# Patient Record
Sex: Male | Born: 1960
Health system: Southern US, Community
[De-identification: ages and names within clinical notes are randomized; demographics above are authoritative.]

## PROBLEM LIST (undated history)

## (undated) DIAGNOSIS — Z8619 Personal history of other infectious and parasitic diseases: Secondary | ICD-10-CM

## (undated) DIAGNOSIS — G4730 Sleep apnea, unspecified: Secondary | ICD-10-CM

## (undated) DIAGNOSIS — I2699 Other pulmonary embolism without acute cor pulmonale: Secondary | ICD-10-CM

## (undated) DIAGNOSIS — K219 Gastro-esophageal reflux disease without esophagitis: Secondary | ICD-10-CM

## (undated) DIAGNOSIS — I82402 Acute embolism and thrombosis of unspecified deep veins of left lower extremity: Secondary | ICD-10-CM

## (undated) DIAGNOSIS — I10 Essential (primary) hypertension: Secondary | ICD-10-CM

## (undated) DIAGNOSIS — E079 Disorder of thyroid, unspecified: Secondary | ICD-10-CM

## (undated) DIAGNOSIS — B059 Measles without complication: Secondary | ICD-10-CM

## (undated) HISTORY — DX: Acute embolism and thrombosis of unspecified deep veins of left lower extremity: I82.402

## (undated) HISTORY — DX: Other pulmonary embolism without acute cor pulmonale: I26.99

## (undated) HISTORY — DX: Personal history of other infectious and parasitic diseases: Z86.19

## (undated) HISTORY — PX: FOOT SURGERY: SHX648

## (undated) HISTORY — DX: Gastro-esophageal reflux disease without esophagitis: K21.9

## (undated) HISTORY — DX: Measles without complication: B05.9

---

## 2013-03-21 DIAGNOSIS — I10 Essential (primary) hypertension: Secondary | ICD-10-CM | POA: Diagnosis present

## 2013-03-21 DIAGNOSIS — G4730 Sleep apnea, unspecified: Secondary | ICD-10-CM | POA: Diagnosis present

## 2015-02-09 DIAGNOSIS — E039 Hypothyroidism, unspecified: Secondary | ICD-10-CM | POA: Diagnosis present

## 2015-02-09 DIAGNOSIS — R600 Localized edema: Secondary | ICD-10-CM | POA: Diagnosis present

## 2016-02-04 ENCOUNTER — Inpatient Hospital Stay (HOSPITAL_COMMUNITY)
Admission: EM | Admit: 2016-02-04 | Discharge: 2016-02-06 | DRG: 176 | Disposition: A | Discharge status: Home / Self Care | Payer: BLUE CROSS/BLUE SHIELD | Attending: Internal Medicine | Admitting: Internal Medicine

## 2016-02-04 ENCOUNTER — Inpatient Hospital Stay (HOSPITAL_COMMUNITY): Payer: BLUE CROSS/BLUE SHIELD

## 2016-02-04 ENCOUNTER — Encounter (HOSPITAL_COMMUNITY): Payer: Self-pay | Admitting: *Deleted

## 2016-02-04 ENCOUNTER — Emergency Department (HOSPITAL_COMMUNITY): Payer: BLUE CROSS/BLUE SHIELD

## 2016-02-04 DIAGNOSIS — I82412 Acute embolism and thrombosis of left femoral vein: Secondary | ICD-10-CM | POA: Diagnosis not present

## 2016-02-04 DIAGNOSIS — I77810 Thoracic aortic ectasia: Secondary | ICD-10-CM | POA: Diagnosis not present

## 2016-02-04 DIAGNOSIS — I82432 Acute embolism and thrombosis of left popliteal vein: Secondary | ICD-10-CM | POA: Diagnosis not present

## 2016-02-04 DIAGNOSIS — M79605 Pain in left leg: Secondary | ICD-10-CM | POA: Diagnosis not present

## 2016-02-04 DIAGNOSIS — I10 Essential (primary) hypertension: Secondary | ICD-10-CM | POA: Diagnosis present

## 2016-02-04 DIAGNOSIS — Z9119 Patient's noncompliance with other medical treatment and regimen: Secondary | ICD-10-CM | POA: Diagnosis not present

## 2016-02-04 DIAGNOSIS — R600 Localized edema: Secondary | ICD-10-CM | POA: Diagnosis present

## 2016-02-04 DIAGNOSIS — R918 Other nonspecific abnormal finding of lung field: Secondary | ICD-10-CM | POA: Diagnosis not present

## 2016-02-04 DIAGNOSIS — E039 Hypothyroidism, unspecified: Secondary | ICD-10-CM | POA: Diagnosis present

## 2016-02-04 DIAGNOSIS — Z6841 Body Mass Index (BMI) 40.0 and over, adult: Secondary | ICD-10-CM | POA: Diagnosis not present

## 2016-02-04 DIAGNOSIS — Z8249 Family history of ischemic heart disease and other diseases of the circulatory system: Secondary | ICD-10-CM

## 2016-02-04 DIAGNOSIS — G4733 Obstructive sleep apnea (adult) (pediatric): Secondary | ICD-10-CM | POA: Diagnosis not present

## 2016-02-04 DIAGNOSIS — G4730 Sleep apnea, unspecified: Secondary | ICD-10-CM

## 2016-02-04 DIAGNOSIS — R0602 Shortness of breath: Secondary | ICD-10-CM | POA: Diagnosis not present

## 2016-02-04 DIAGNOSIS — R911 Solitary pulmonary nodule: Secondary | ICD-10-CM | POA: Diagnosis present

## 2016-02-04 DIAGNOSIS — I2699 Other pulmonary embolism without acute cor pulmonale: Secondary | ICD-10-CM | POA: Diagnosis not present

## 2016-02-04 DIAGNOSIS — E669 Obesity, unspecified: Secondary | ICD-10-CM | POA: Diagnosis present

## 2016-02-04 DIAGNOSIS — I82402 Acute embolism and thrombosis of unspecified deep veins of left lower extremity: Secondary | ICD-10-CM | POA: Diagnosis present

## 2016-02-04 DIAGNOSIS — R05 Cough: Secondary | ICD-10-CM | POA: Diagnosis not present

## 2016-02-04 DIAGNOSIS — I2609 Other pulmonary embolism with acute cor pulmonale: Secondary | ICD-10-CM | POA: Diagnosis not present

## 2016-02-04 HISTORY — DX: Essential (primary) hypertension: I10

## 2016-02-04 HISTORY — DX: Sleep apnea, unspecified: G47.30

## 2016-02-04 HISTORY — DX: Disorder of thyroid, unspecified: E07.9

## 2016-02-04 LAB — COMPREHENSIVE METABOLIC PANEL
ALK PHOS: 83 U/L (ref 38–126)
ALT: 24 U/L (ref 17–63)
ANION GAP: 8 (ref 5–15)
AST: 18 U/L (ref 15–41)
Albumin: 3.9 g/dL (ref 3.5–5.0)
BILIRUBIN TOTAL: 0.9 mg/dL (ref 0.3–1.2)
BUN: 19 mg/dL (ref 6–20)
CALCIUM: 9 mg/dL (ref 8.9–10.3)
CO2: 23 mmol/L (ref 22–32)
Chloride: 106 mmol/L (ref 101–111)
Creatinine, Ser: 1.22 mg/dL (ref 0.61–1.24)
GLUCOSE: 108 mg/dL — AB (ref 65–99)
Potassium: 3.9 mmol/L (ref 3.5–5.1)
Sodium: 137 mmol/L (ref 135–145)
TOTAL PROTEIN: 7.1 g/dL (ref 6.5–8.1)

## 2016-02-04 LAB — CBC
HCT: 40.4 % (ref 39.0–52.0)
HEMOGLOBIN: 14.2 g/dL (ref 13.0–17.0)
MCH: 31.2 pg (ref 26.0–34.0)
MCHC: 35.1 g/dL (ref 30.0–36.0)
MCV: 88.8 fL (ref 78.0–100.0)
Platelets: 180 10*3/uL (ref 150–400)
RBC: 4.55 MIL/uL (ref 4.22–5.81)
RDW: 12.2 % (ref 11.5–15.5)
WBC: 10.3 10*3/uL (ref 4.0–10.5)

## 2016-02-04 LAB — HEPARIN LEVEL (UNFRACTIONATED): HEPARIN UNFRACTIONATED: 0.27 [IU]/mL — AB (ref 0.30–0.70)

## 2016-02-04 LAB — BRAIN NATRIURETIC PEPTIDE: B NATRIURETIC PEPTIDE 5: 36.4 pg/mL (ref 0.0–100.0)

## 2016-02-04 LAB — TROPONIN I

## 2016-02-04 LAB — APTT: aPTT: 28 seconds (ref 24–37)

## 2016-02-04 LAB — D-DIMER, QUANTITATIVE: D-Dimer, Quant: 16.51 ug/mL-FEU — ABNORMAL HIGH (ref 0.00–0.50)

## 2016-02-04 LAB — PROTIME-INR
INR: 1.11 (ref 0.00–1.49)
Prothrombin Time: 14.1 seconds (ref 11.6–15.2)

## 2016-02-04 MED ORDER — ONDANSETRON HCL 4 MG/2ML IJ SOLN: 4.0000 mg | Freq: Four times a day (QID) | INTRAMUSCULAR | Status: DC | PRN

## 2016-02-04 MED ORDER — FUROSEMIDE 40 MG PO TABS
40.0000 mg | ORAL_TABLET | Freq: Every morning | ORAL | Status: DC
  Administered 2016-02-05 – 2016-02-06 (×2): 40 mg via ORAL
  Filled 2016-02-04 (×2): qty 1

## 2016-02-04 MED ORDER — HEPARIN (PORCINE) IN NACL 100-0.45 UNIT/ML-% IJ SOLN
1950.0000 [IU]/h | INTRAMUSCULAR | Status: DC
  Administered 2016-02-05 – 2016-02-06 (×2): 1950 [IU]/h via INTRAVENOUS
  Filled 2016-02-04 (×4): qty 250

## 2016-02-04 MED ORDER — CARVEDILOL 12.5 MG PO TABS
12.5000 mg | ORAL_TABLET | Freq: Every day | ORAL | Status: DC
  Administered 2016-02-04 – 2016-02-05 (×2): 12.5 mg via ORAL
  Filled 2016-02-04 (×2): qty 1

## 2016-02-04 MED ORDER — ACETAMINOPHEN 325 MG PO TABS: 650.0000 mg | ORAL_TABLET | Freq: Four times a day (QID) | ORAL | Status: DC | PRN

## 2016-02-04 MED ORDER — IOPAMIDOL (ISOVUE-370) INJECTION 76%
100.0000 mL | Freq: Once | INTRAVENOUS | Status: AC | PRN
  Administered 2016-02-04: 100 mL via INTRAVENOUS

## 2016-02-04 MED ORDER — ACETAMINOPHEN 650 MG RE SUPP: 650.0000 mg | Freq: Four times a day (QID) | RECTAL | Status: DC | PRN

## 2016-02-04 MED ORDER — HEPARIN (PORCINE) IN NACL 100-0.45 UNIT/ML-% IJ SOLN
1750.0000 [IU]/h | INTRAMUSCULAR | Status: DC
  Administered 2016-02-04: 1750 [IU]/h via INTRAVENOUS
  Filled 2016-02-04 (×3): qty 250

## 2016-02-04 MED ORDER — ONDANSETRON HCL 4 MG PO TABS: 4.0000 mg | ORAL_TABLET | Freq: Four times a day (QID) | ORAL | Status: DC | PRN

## 2016-02-04 MED ORDER — HEPARIN BOLUS VIA INFUSION
5000.0000 [IU] | Freq: Once | INTRAVENOUS | Status: DC
  Administered 2016-02-04: 5000 [IU] via INTRAVENOUS
  Filled 2016-02-04: qty 5000

## 2016-02-04 NOTE — Progress Notes (Signed)
Pt stated that he will call when ready for CPAP.  RT to monitor and assess as needed. 

## 2016-02-04 NOTE — ED Notes (Signed)
MD at bedside. 

## 2016-02-04 NOTE — ED Notes (Signed)
Jacubowitz at bedside. 

## 2016-02-04 NOTE — Progress Notes (Signed)
MD notified about results of LE doppler. + blood clot. Patient currently on Heparin drip at 17.5 [facility]. Clelia CroftShaw, RN

## 2016-02-04 NOTE — Progress Notes (Addendum)
ANTICOAGULATION CONSULT NOTE   Pharmacy Consult for IV heparin Indication: pulmonary embolus/DVT  No Known Allergies  Patient Measurements: Height: 6\' 1"  (185.4 cm) Weight: (!) 350 lb 8 oz (158.986 kg) IBW/kg (Calculated) : 79.9 Heparin Dosing Weight: 103 kg  Vital Signs: Temp: 98.5 F (36.9 C) (04/22 2119) Temp Source: Oral (04/22 2119) BP: 127/75 mmHg (04/22 2119) Pulse Rate: 70 (04/22 2119)  Labs:  Recent Labs  02/04/16 0900 02/04/16 1421 02/04/16 2111  HGB 14.2  --   --   HCT 40.4  --   --   PLT 180  --   --   APTT  --  28  --   LABPROT  --  14.1  --   INR  --  1.11  --   HEPARINUNFRC  --   --  0.27*  CREATININE 1.22  --   --   TROPONINI <0.03  --   --     Estimated Creatinine Clearance: 109.2 mL/min (by C-G formula based on Cr of 1.22).   Medical History: Past Medical History  Diagnosis Date  . Hypertension   . Thyroid disease   . Sleep apnea     Medications:  Prescriptions prior to admission  Medication Sig Dispense Refill Last Dose  . carvedilol (COREG) 12.5 MG tablet Take 12.5 mg by mouth at bedtime.    02/03/2016 at 9pm  . dextromethorphan-guaiFENesin (MUCINEX DM) 30-600 MG 12hr tablet Take 1 tablet by mouth 2 (two) times daily as needed for cough.   Past Week at Unknown time  . furosemide (LASIX) 40 MG tablet Take 40 mg by mouth every morning.    Past Month at Unknown time   Scheduled:  . carvedilol  12.5 mg Oral QHS  . [START ON 02/05/2016] furosemide  40 mg Oral q morning - 10a   Infusions:  . heparin      Assessment: 54 yoM c/o SOB with exertion and rest x 2 days and left leg swelling x 1 week.  IV heparin per Rx for +PE.  Dopplers (4/22) showed + DVT in left femoral vein Heparin level = 0.27 with heparin infusing @ 1750 units/hr  Goal of Therapy:  Heparin level 0.3-0.7 units/ml Monitor platelets by anticoagulation protocol: Yes   Plan:   Increase IV Heparin infusion to 1950 units/hr  Check heparin level 6 hr after rate  increase  Daily CBC/HL  Vallery Mcdade, Joselyn GlassmanLeann Trefz, PharmD 02/04/2016,10:43 PM  ADDENDUM:  Heparin level = 0.37 with heparin infusing @ 1950 units/hr No complications of therapy noted  Heparin level therapeutic (Goal = 0.3-0.7)  Plan:  Continue heparin @ 1950 units/hr.  Repeat heparin level in 6 hr to confirm therapeutic dose.  Terrilee FilesLeann Karmina Zufall, PharmD 02/05/16 @ 06:43

## 2016-02-04 NOTE — Progress Notes (Signed)
*  PRELIMINARY RESULTS* Vascular Ultrasound Lower extremity venous duplex has been completed.  Preliminary findings: DVT noted in the Left femoral vein, popliteal vein, and gastroc veins. No DVT RLE.  Gave results to CornlandBrooke, RN   Farrel DemarkJill Eunice, RDMS, RVT  02/04/2016, 3:40 PM

## 2016-02-04 NOTE — H&P (Signed)
[facility] History and Physical  PAZ WINSETT ZOX:[id number] DOB: [birth year] DOA: 02/04/2016  Referring physician: Dr. Doug Sou, EDP PCP: No primary care provider on file.  Specialists: None Patient coming from: Home  Chief Complaint: Shortness of breath  HPI: [name] is a 55 y.o. male with a medical history of hypertension, sleep apnea, who presented to the emergency department with complaints of shortness of breath. Patient states that shortness of breath has been ongoing for pressing one year however is worsened over the last 2 weeks. Patient also endorses flulike illness 2 weeks prior. Patient states he's been seeing a practitioner at urgent care for the last several months. He was on lisinopril however this was discontinued due to kidney issues. He was then placed on Lasix and Coreg. Patient also complained of left leg pain and swelling. He states this has been ongoing since March. Patient denies any recent travel. Currently he denies any chest pain, abdominal pain, dizziness, headache, nausea, vomiting, diarrhea or constipation, problems urinating.  ED Course: CTA chest showed submassive PE. EDP did speak with pulmonology, who recommended IV heparin for a few days prior to oral anticoagulation.  Review of Systems:  As per HPI otherwise 10 point review of systems negative.   Past Medical History  Diagnosis Date  . Hypertension   . Thyroid disease   . Sleep apnea     History reviewed. No pertinent past surgical history.  Social History:  reports that he has never smoked. He does not have any smokeless tobacco history on file. He reports that he does not drink alcohol or use illicit drugs.  No Known Allergies  Family History  Problem Relation Age of Onset  . Hypertension Father   . Heart disease Mother   . Pulmonary embolism Mother     Prior to Admission medications   Medication Sig Start Date End Date Taking? Authorizing Provider  carvedilol  (COREG) 12.5 MG tablet Take 12.5 mg by mouth at bedtime.  02/17/15 02/17/16 Yes Historical Provider, MD  dextromethorphan-guaiFENesin (MUCINEX DM) 30-600 MG 12hr tablet Take 1 tablet by mouth 2 (two) times daily as needed for cough.   Yes Historical Provider, MD  furosemide (LASIX) 40 MG tablet Take 40 mg by mouth every morning.  03/28/15 03/27/16 Yes Historical Provider, MD    Physical Exam: Filed Vitals:   02/04/16 1210 02/04/16 1410  BP: 138/65 157/89  Pulse: 64 72  Temp:    Resp: 20 18     General: Well developed, well nourished, NAD, appears stated age  HEENT: NCAT, PERRLA, EOMI, Anicteic Sclera, mucous membranes moist.   Neck: Supple, no JVD, no masses   Cardiovascular: S1 S2 auscultated, no rubs, murmurs or gallops. Regular rate and rhythm.  Respiratory: Clear to auscultation bilaterally   Abdomen: Soft, obese, nontender, nondistended, + bowel sounds  Extremities: warm dry without cyanosis clubbing. LLE edema with erythema > RLE   Neuro: AAOx3, cranial nerves grossly intact. Strength 5/5 in patient's upper and lower extremities bilaterally  Skin: Without rashes exudates or nodules  Psych: Normal affect and demeanor with intact judgement and insight  Labs on Admission: I have personally reviewed following labs and imaging studies CBC:  Recent Labs Lab 02/04/16 0900  WBC 10.3  HGB 14.2  HCT 40.4  MCV 88.8  PLT 180   Basic Metabolic Panel:  Recent Labs Lab 02/04/16 0900  NA 137  K 3.9  CL 106  CO2 23  GLUCOSE 108*  BUN 19  CREATININE 1.22  CALCIUM 9.0   GFR: Estimated Creatinine Clearance: 109.2 mL/min (by C-G formula based on Cr of 1.22). Liver Function Tests:  Recent Labs Lab 02/04/16 0900  AST 18  ALT 24  ALKPHOS 83  BILITOT 0.9  PROT 7.1  ALBUMIN 3.9   No results for input(s): LIPASE, AMYLASE in the last 168 hours. No results for input(s): AMMONIA in the last 168 hours. Coagulation Profile: No results for input(s): INR, PROTIME in the  last 168 hours. Cardiac Enzymes:  Recent Labs Lab 02/04/16 0900  TROPONINI <0.03   BNP (last 3 results) No results for input(s): PROBNP in the last 8760 hours. HbA1C: No results for input(s): HGBA1C in the last 72 hours. CBG: No results for input(s): GLUCAP in the last 168 hours. Lipid Profile: No results for input(s): CHOL, HDL, LDLCALC, TRIG, CHOLHDL, LDLDIRECT in the last 72 hours. Thyroid Function Tests: No results for input(s): TSH, T4TOTAL, FREET4, T3FREE, THYROIDAB in the last 72 hours. Anemia Panel: No results for input(s): VITAMINB12, FOLATE, FERRITIN, TIBC, IRON, RETICCTPCT in the last 72 hours. Urine analysis: No results found for: COLORURINE, APPEARANCEUR, LABSPEC, PHURINE, GLUCOSEU, HGBUR, BILIRUBINUR, KETONESUR, PROTEINUR, UROBILINOGEN, NITRITE, LEUKOCYTESUR Sepsis Labs: (procalcitonin:4,lacticidven:4) )No results found for this or any previous visit (from the past 240 hour(s)).   Radiological Exams on Admission: Dg Chest 2 View  02/04/2016  CLINICAL DATA:  SOB x 2 weeks, worsening x 2-3 days ago, dry cough x 1 week HTN, never smoker No sx EXAM: CHEST - 2 VIEW COMPARISON:  None available FINDINGS: Relatively low lung volumes with some crowding of perihilar bronchovascular structures. No focal infiltrate or overt edema. Motion degrades the lateral radiograph. Heart size upper limits normal. No effusion.  No pneumothorax. Visualized skeletal structures are unremarkable. IMPRESSION: Low volumes.  No acute disease. Electronically Signed   By: Corlis Leak M.D.   On: 02/04/2016 10:14   Ct Angio Chest Pe W/cm &/or Wo Cm  02/04/2016  CLINICAL DATA:  Evaluate for pulmonary embolus ; Pt c/o SOB with exertion and at rest x 2 days, also c/o left leg swelling x 1 week , today left calf pain Hx of HTN EXAM: CT ANGIOGRAPHY CHEST WITH CONTRAST TECHNIQUE: Multidetector CT imaging of the chest was performed using the standard protocol during bolus administration of intravenous  contrast. Multiplanar CT image reconstructions and MIPs were obtained to evaluate the vascular anatomy. CONTRAST:  100 cc Isovue 370 COMPARISON:  Chest x-ray 02/04/2016 FINDINGS: Heart: Overall heart size is upper normal. The RV/LV ratio is 1.1. No pericardial effusion. Vascular structures: Pulmonary arteries are only moderately well opacified, limiting evaluation of small pulmonary arteries. However there is significant thrombus in the right upper lobe and right lower lobe pulmonary artery branches. Although no definite emboli are identified in the left pulmonary artery system, smaller emboli are difficult to exclude. Mediastinum/thyroid: The visualized portion of the thyroid gland has a normal appearance. Moderate hiatal hernia is present. Lungs/Airways: In the right middle lobe there is a 4 x 4 mm nodule (for mm mean diameter) on image 49 of series 11. No focal consolidation or pleural effusion identified. No pulmonary edema. Upper abdomen: Hiatal hernia; otherwise unremarkable. Chest wall/osseous structures: Within the midline of the upper anterior chest there is a rounded soft tissue density mass measuring 3.4 x 2.2 cm anterior to the sternomanubrial junction. Visualized osseous structures have a normal appearance. Review of the MIP images confirms the above findings. IMPRESSION: 1. Positive for acute PE with CT evidence of right heart strain (RV/LV  Ratio = 1.1) consistent with at least submassive (intermediate risk) PE. The presence of right heart strain has been associated with an increased risk of morbidity and mortality. Please activate Code PE by paging [phone]. 2. Right middle lobe nodule. No follow-up needed if patient is low-risk. Non-contrast chest CT can be considered in 12 months if patient is high-risk. This recommendation follows the consensus statement: Guidelines for Management of Incidental Pulmonary Nodules Detected on CT Images:From the Fleischner Society 2017; published online before  print (10.1148/radiol.[phone]). 3. Rounded soft tissue density mass in the upper central anterior chest, anterior to the sternomanubrial junction. Findings could be related to small epidermal inclusion cyst or small solid mass. Correlation with physical exam findings recommended. Critical Value/emergent results were called by telephone at the time of interpretation on 02/04/2016 at 1:47 pm to Dr. Doug SouSAM JACUBOWITZ , who verbally acknowledged these results. Electronically Signed   By: Norva PavlovElizabeth  Brown M.D.   On: 02/04/2016 13:47    EKG: Independently reviewed. Sinus rhythm, rate 81  Assessment/Plan  Dyspnea secondary to acute submassive PE -CTA chest showed positive for acute PE with evidence of right heart strain consistent with early submassive PE -Will place on heparin per pharmacy -Patient can likely be transitioned to NOAC -Echocardiogram ordered -Suspect PE due clotting factor disorder, given that mother had PE as well.  Patient will need to have work up as an outpatient.  Left leg pain -LE doppler ordered  Hypertension -Continue coreg and lasix  Lung nodule -Noted on CT scan: Right middle lobe -Patient should have repeat CT chest within 12 months  Morbid obesity -BMI >46 -Patient should discuss lifestyle modifications and weight management with PCP   Thyroid disease -Will order TSH and FT4 -Supposedly was on synthroid but was not taking it.   Sleep apnea -Continue CPAP  DVT prophylaxis: Heparin  Code Status: Full  Family Communication: Family at bedside. Admission, patients condition and plan of care including tests being ordered have been discussed with the patient and family who indicate understanding and agree with the plan and Code Status.  Disposition Plan: Admitted to telemetry.   Consults called: none   Admission status: Inpatient   Time spent: 70 minutes  Anakaren Campion D.O. [facility] Pager [phone]  If 7PM-7AM, please contact  night-coverage [email or web address] Password [email or web address] 02/04/2016, 2:54 PM

## 2016-02-04 NOTE — Progress Notes (Signed)
I am at [facility]. Call from Dr Doug SouSam Jacubowitz about CTA showing RV strain - and ? If pulmnary needed  Calculated PESI score - He has class 1 - based on age 55 y.o. and male - but otherwise I am told no hx of cancer, cvs or lung disease . Has normal RR, temp, pulse ox on RA, BP and mentation  REc over phone  - [facility] admit  - IV heparin for few days before oral anticoag  Dr. Kalman ShanMurali Teleah Villamar, M.D., [facility] Hospital[facility].C.C.P Pulmonary and Critical Care Medicine Staff Physician [facility] Pager: [phone], If no answer or between  15:00h - 7:00h: call [phone]  02/04/2016 2:16 PM

## 2016-02-04 NOTE — ED Notes (Signed)
Pt c/o SOB with exertion and at rest x 2 days, also c/o left leg swelling x 1 week , today left calf pain

## 2016-02-04 NOTE — ED Provider Notes (Signed)
CSN: [id number]     Arrival date & time 02/04/16  4098 History   First MD Initiated Contact with Patient 02/04/16 [phone]     Chief Complaint  Patient presents with  . Shortness of Breath     (Consider location/radiation/quality/duration/timing/severity/associated sxs/prior Treatment) HPI Complains of shortness of breath for 1 year worse over the past 2 weeks. Dyspnea is not made worse by lying supine. He cannot comment on exertional component as he is not exerting himself at all. He admits to a flulike illness 2 weeks ago with cough which has since resolved. He denies any fever. No treatment prior to coming here aside from his usual medications. He also complains of pain in his left leg starting at and radiating to left knee onset approximately 2 weeks ago which is worse with certain positions and improved with certain positions leg discomfort is minimal at present. Denies chest pain denies fever Past Medical History  Diagnosis Date  . Hypertension   . Thyroid disease   Sleep apnea History reviewed. No pertinent past surgical history. History reviewed. No pertinent family history. Social History  Substance Use Topics  . Smoking status: Never Smoker   . Smokeless tobacco: None  . Alcohol Use: No    Review of Systems  Respiratory: Positive for shortness of breath.   Cardiovascular: Positive for leg swelling.       Bilateral Leg swelling for-any months.   Musculoskeletal: Positive for myalgias.       Left leg pain for 2 weeks  All other systems reviewed and are negative.     Allergies  Review of patient's allergies indicates not on file.  Home Medications   Prior to Admission medications   Medication Sig Start Date End Date Taking? Authorizing Provider  carvedilol (COREG) 12.5 MG tablet Take 12.5 mg by mouth. 02/17/15 02/17/16 Yes Historical Provider, MD  furosemide (LASIX) 40 MG tablet Take 40 mg by mouth. 03/28/15 03/27/16 Yes Historical Provider, MD  levothyroxine (SYNTHROID,  LEVOTHROID) 25 MCG tablet Take by mouth. 01/13/15  Yes Historical Provider, MD  lisinopril (PRINIVIL,ZESTRIL) 10 MG tablet  02/14/15  Yes Historical Provider, MD  naproxen (NAPROSYN) 500 MG tablet Take 500 mg by mouth. 02/09/15 02/09/16 Yes Historical Provider, MD   BP 178/102 mmHg  Pulse 79  Temp(Src) 98.7 F (37.1 C) (Oral)  Resp 17  SpO2 98% Physical Exam  Constitutional: He appears well-developed and well-nourished. No distress.  HENT:  Head: Normocephalic and atraumatic.  Eyes: Conjunctivae are normal. Pupils are equal, round, and reactive to light.  Neck: Neck supple. No JVD present. No tracheal deviation present. No thyromegaly present.  Cardiovascular: Normal rate and regular rhythm.   No murmur heard. Pulmonary/Chest: Effort normal and breath sounds normal.  Abdominal: Soft. Bowel sounds are normal. He exhibits no distension. There is no tenderness.  Morbidly obese  Musculoskeletal: Normal range of motion. He exhibits no edema or tenderness.  Bilateral 2+ pretibial pitting edema. No tenderness extremities bilateral DP pulses 2+  Neurological: He is alert. Coordination normal.  Skin: Skin is warm and dry. No rash noted.  Psychiatric: He has a normal mood and affect.  Nursing note and vitals reviewed.   ED Course  Procedures (including critical care time) Labs Review Labs Reviewed  CBC  COMPREHENSIVE METABOLIC PANEL    Imaging Review No results found. I have personally reviewed and evaluated these images and lab results as part of my medical decision-making.   EKG Interpretation   Date/Time:  Saturday February 04 2016  09:09:08 EDT Ventricular Rate:  81 PR Interval:  191 QRS Duration: 94 QT Interval:  404 QTC Calculation: 469 R Axis:   -2 Text Interpretation:  Sinus rhythm Borderline repolarization abnormality  Baseline wander in lead(s) V5 No old tracing to compare Confirmed by  Hayleigh Bawa  MD, Raydan Schlabach [phone]([id number]) on 02/04/2016 9:12:14 AM     1 PM patient resting  comfortably and in no distress. Results for orders placed or performed during the hospital encounter of 02/04/16  CBC  Result Value Ref Range   WBC 10.3 4.0 - 10.5 K/uL   RBC 4.55 4.22 - 5.81 MIL/uL   Hemoglobin 14.2 13.0 - 17.0 g/dL   HCT 60.440.4 54.039.0 - 98.152.0 %   MCV 88.8 78.0 - 100.0 fL   MCH 31.2 26.0 - 34.0 pg   MCHC 35.1 30.0 - 36.0 g/dL   RDW 19.112.2 47.811.5 - 29.515.5 %   Platelets 180 150 - 400 K/uL  Comprehensive metabolic panel  Result Value Ref Range   Sodium 137 135 - 145 mmol/L   Potassium 3.9 3.5 - 5.1 mmol/L   Chloride 106 101 - 111 mmol/L   CO2 23 22 - 32 mmol/L   Glucose, Bld 108 (H) 65 - 99 mg/dL   BUN 19 6 - 20 mg/dL   Creatinine, Ser 6.211.22 0.61 - 1.24 mg/dL   Calcium 9.0 8.9 - 30.810.3 mg/dL   Total Protein 7.1 6.5 - 8.1 g/dL   Albumin 3.9 3.5 - 5.0 g/dL   AST 18 15 - 41 U/L   ALT 24 17 - 63 U/L   Alkaline Phosphatase 83 38 - 126 U/L   Total Bilirubin 0.9 0.3 - 1.2 mg/dL   GFR calc non Af Amer >60 >60 mL/min   GFR calc Af Amer >60 >60 mL/min   Anion gap 8 5 - 15  D-dimer, quantitative (not at [facility])  Result Value Ref Range   D-Dimer, Quant 16.51 (H) 0.00 - 0.50 ug/mL-FEU  Brain natriuretic peptide  Result Value Ref Range   B Natriuretic Peptide 36.4 0.0 - 100.0 pg/mL  Troponin I  Result Value Ref Range   Troponin I <0.03 <0.031 ng/mL   Dg Chest 2 View  02/04/2016  CLINICAL DATA:  SOB x 2 weeks, worsening x 2-3 days ago, dry cough x 1 week HTN, never smoker No sx EXAM: CHEST - 2 VIEW COMPARISON:  None available FINDINGS: Relatively low lung volumes with some crowding of perihilar bronchovascular structures. No focal infiltrate or overt edema. Motion degrades the lateral radiograph. Heart size upper limits normal. No effusion.  No pneumothorax. Visualized skeletal structures are unremarkable. IMPRESSION: Low volumes.  No acute disease. Electronically Signed   By: Corlis Leak  Hassell M.D.   On: 02/04/2016 10:14   Ct Angio Chest Pe W/cm &/or Wo Cm  02/04/2016  CLINICAL DATA:   Evaluate for pulmonary embolus ; Pt c/o SOB with exertion and at rest x 2 days, also c/o left leg swelling x 1 week , today left calf pain Hx of HTN EXAM: CT ANGIOGRAPHY CHEST WITH CONTRAST TECHNIQUE: Multidetector CT imaging of the chest was performed using the standard protocol during bolus administration of intravenous contrast. Multiplanar CT image reconstructions and MIPs were obtained to evaluate the vascular anatomy. CONTRAST:  100 cc Isovue 370 COMPARISON:  Chest x-ray 02/04/2016 FINDINGS: Heart: Overall heart size is upper normal. The RV/LV ratio is 1.1. No pericardial effusion. Vascular structures: Pulmonary arteries are only moderately well opacified, limiting evaluation of small pulmonary arteries. However there  is significant thrombus in the right upper lobe and right lower lobe pulmonary artery branches. Although no definite emboli are identified in the left pulmonary artery system, smaller emboli are difficult to exclude. Mediastinum/thyroid: The visualized portion of the thyroid gland has a normal appearance. Moderate hiatal hernia is present. Lungs/Airways: In the right middle lobe there is a 4 x 4 mm nodule (for mm mean diameter) on image 49 of series 11. No focal consolidation or pleural effusion identified. No pulmonary edema. Upper abdomen: Hiatal hernia; otherwise unremarkable. Chest wall/osseous structures: Within the midline of the upper anterior chest there is a rounded soft tissue density mass measuring 3.4 x 2.2 cm anterior to the sternomanubrial junction. Visualized osseous structures have a normal appearance. Review of the MIP images confirms the above findings. IMPRESSION: 1. Positive for acute PE with CT evidence of right heart strain (RV/LV Ratio = 1.1) consistent with at least submassive (intermediate risk) PE. The presence of right heart strain has been associated with an increased risk of morbidity and mortality. Please activate Code PE by paging [phone]. 2. Right middle lobe  nodule. No follow-up needed if patient is low-risk. Non-contrast chest CT can be considered in 12 months if patient is high-risk. This recommendation follows the consensus statement: Guidelines for Management of Incidental Pulmonary Nodules Detected on CT Images:From the Fleischner Society 2017; published online before print (10.1148/radiol.[id number]). 3. Rounded soft tissue density mass in the upper central anterior chest, anterior to the sternomanubrial junction. Findings could be related to small epidermal inclusion cyst or small solid mass. Correlation with physical exam findings recommended. Critical Value/emergent results were called by telephone at the time of interpretation on 02/04/2016 at 1:47 pm to Dr. Doug Sou , who verbally acknowledged these results. Electronically Signed   By: Norva Pavlov M.D.   On: 02/04/2016 13:47    MDM  Dr.mihail consulted plan intravenous heparin per pharmacy protocol admit to telemetry. I did consult pulmonary critical care medicine via telephone. Spoke with pulmonologist Dr Stacie Acres who calculate  PESI core of 1, and determined patient is candidate for intravenous heparin and admission to hospitalist service Final diagnoses:  None  Diagnosis #1 acute pulmonary embolism #2 soft tissue mass #3 lung nodule      Doug Sou, MD 02/04/16 1419

## 2016-02-04 NOTE — ED Notes (Signed)
Hospitalist at bedside 

## 2016-02-04 NOTE — Progress Notes (Signed)
ANTICOAGULATION CONSULT NOTE - Initial Consult  Pharmacy Consult for IV heparin Indication: pulmonary embolus  No Known Allergies  Patient Measurements: Height: 6\' 1"  (185.4 cm) Weight: (!) 350 lb (158.759 kg) IBW/kg (Calculated) : 79.9 Heparin Dosing Weight: 103 kg  Vital Signs: Temp: 98.7 F (37.1 C) (04/22 0901) Temp Source: Oral (04/22 0901) BP: 157/89 mmHg (04/22 1410) Pulse Rate: 72 (04/22 1410)  Labs:  Recent Labs  02/04/16 0900  HGB 14.2  HCT 40.4  PLT 180  CREATININE 1.22  TROPONINI <0.03    Estimated Creatinine Clearance: 109.2 mL/min (by C-G formula based on Cr of 1.22).   Medical History: Past Medical History  Diagnosis Date  . Hypertension   . Thyroid disease   . Sleep apnea     Medications:   (Not in a hospital admission) Scheduled:  . heparin  5,000 Units Intravenous Once   Infusions:  . heparin      Assessment: 54 yoM c/o SOB with exertion and rest x 2 days and left leg swelling x 1 week.  IV heparin per Rx for +PE. Goal of Therapy:  Heparin level 0.3-0.7 units/ml Monitor platelets by anticoagulation protocol: Yes   Plan:   Baseline coags and ht/wt stat  Heparin 5000 unit bolus x1  Start drip @ 1750 units/hr  Daily CBC/HL  Check 1st HL in 6 hours  Lorenza EvangelistGreen, Delson Dulworth R 02/04/2016,2:21 PM

## 2016-02-05 ENCOUNTER — Inpatient Hospital Stay (HOSPITAL_COMMUNITY): Payer: BLUE CROSS/BLUE SHIELD

## 2016-02-05 DIAGNOSIS — R600 Localized edema: Secondary | ICD-10-CM

## 2016-02-05 DIAGNOSIS — G4733 Obstructive sleep apnea (adult) (pediatric): Secondary | ICD-10-CM

## 2016-02-05 DIAGNOSIS — E039 Hypothyroidism, unspecified: Secondary | ICD-10-CM

## 2016-02-05 DIAGNOSIS — I2699 Other pulmonary embolism without acute cor pulmonale: Secondary | ICD-10-CM

## 2016-02-05 DIAGNOSIS — I10 Essential (primary) hypertension: Secondary | ICD-10-CM

## 2016-02-05 DIAGNOSIS — R911 Solitary pulmonary nodule: Secondary | ICD-10-CM

## 2016-02-05 DIAGNOSIS — I82402 Acute embolism and thrombosis of unspecified deep veins of left lower extremity: Secondary | ICD-10-CM

## 2016-02-05 LAB — BASIC METABOLIC PANEL
ANION GAP: 9 (ref 5–15)
BUN: 17 mg/dL (ref 6–20)
CO2: 25 mmol/L (ref 22–32)
CREATININE: 1.12 mg/dL (ref 0.61–1.24)
Calcium: 8.6 mg/dL — ABNORMAL LOW (ref 8.9–10.3)
Chloride: 105 mmol/L (ref 101–111)
GFR calc Af Amer: >60 mL/min (ref >60)
GFR calc non Af Amer: >60 mL/min (ref >60)
Glucose, Bld: 121 mg/dL — ABNORMAL HIGH (ref 65–99)
POTASSIUM: 4 mmol/L (ref 3.5–5.1)
SODIUM: 139 mmol/L (ref 135–145)

## 2016-02-05 LAB — CBC
HEMATOCRIT: 38.5 % — AB (ref 39.0–52.0)
Hemoglobin: 13.3 g/dL (ref 13.0–17.0)
MCH: 31.9 pg (ref 26.0–34.0)
MCHC: 34.5 g/dL (ref 30.0–36.0)
MCV: 92.3 fL (ref 78.0–100.0)
PLATELETS: 160 10*3/uL (ref 150–400)
RBC: 4.17 MIL/uL — ABNORMAL LOW (ref 4.22–5.81)
RDW: 12.6 % (ref 11.5–15.5)
WBC: 8.1 10*3/uL (ref 4.0–10.5)

## 2016-02-05 LAB — HEPARIN LEVEL (UNFRACTIONATED)
Heparin Unfractionated: 0.37 IU/mL (ref 0.30–0.70)
Heparin Unfractionated: 0.36 IU/mL (ref 0.30–0.70)

## 2016-02-05 LAB — ECHOCARDIOGRAM COMPLETE
HEIGHTINCHES: 73 in
Weight: 5582.4 oz

## 2016-02-05 LAB — TSH: TSH: 2.698 u[IU]/mL (ref 0.350–4.500)

## 2016-02-05 NOTE — Progress Notes (Signed)
Utilization review completed.  

## 2016-02-05 NOTE — Progress Notes (Signed)
ANTICOAGULATION CONSULT NOTE   Pharmacy Consult for IV heparin Indication: pulmonary embolus/DVT  No Known Allergies  Patient Measurements: Height: 6\' 1"  (185.4 cm) Weight: (!) 348 lb 14.4 oz (158.26 kg) IBW/kg (Calculated) : 79.9 Heparin Dosing Weight: 103 kg  Vital Signs: Temp: 98.1 F (36.7 C) (04/23 0558) Temp Source: Oral (04/23 0558) BP: 137/73 mmHg (04/23 0558) Pulse Rate: 67 (04/23 0558)  Labs:  Recent Labs  02/04/16 0900 02/04/16 1421 02/04/16 2111 02/05/16 0536 02/05/16 1143  HGB 14.2  --   --  13.3  --   HCT 40.4  --   --  38.5*  --   PLT 180  --   --  160  --   APTT  --  28  --   --   --   LABPROT  --  14.1  --   --   --   INR  --  1.11  --   --   --   HEPARINUNFRC  --   --  0.27* 0.37 0.36  CREATININE 1.22  --   --  1.12  --   TROPONINI <0.03  --   --   --   --     Estimated Creatinine Clearance: 118.7 mL/min (by C-G formula based on Cr of 1.12).   Medical History: Past Medical History  Diagnosis Date  . Hypertension   . Thyroid disease   . Sleep apnea     Medications:  Prescriptions prior to admission  Medication Sig Dispense Refill Last Dose  . carvedilol (COREG) 12.5 MG tablet Take 12.5 mg by mouth at bedtime.    02/03/2016 at 9pm  . dextromethorphan-guaiFENesin (MUCINEX DM) 30-600 MG 12hr tablet Take 1 tablet by mouth 2 (two) times daily as needed for cough.   Past Week at Unknown time  . furosemide (LASIX) 40 MG tablet Take 40 mg by mouth every morning.    Past Month at Unknown time   Scheduled:  . carvedilol  12.5 mg Oral QHS  . furosemide  40 mg Oral q morning - 10a   Infusions:  . heparin 1,950 Units/hr (02/05/16 1213)    Assessment: 54 yoM c/o SOB with exertion and rest x 2 days and left leg swelling x 1 week.  IV heparin per Rx for submassive PE. Per [facility], PCCM recommends several days of IV heparin gtt.  Dopplers (4/22) showed + DVT in left femoral vein. Baseline INR = 1.11  02/05/2016:  Heparin level level remains therapeutic  with heparin infusing @ 1950 units/hr  CBC: Hgb and pltc remain WNL  Goal of Therapy:  Heparin level 0.3-0.7 units/ml Monitor platelets by anticoagulation protocol: Yes   Plan:   Continue IV Heparin infusion at 1950 units/hr  Daily CBC/HL  [facility] has d/w patient and family member options for oral anticoagulation.  Plan to convert to PO anticoagulation 4/24.  Either (DOAC or warfarin) appear to be appropriate.  As long as cost of DOAC is not prohibitive.   Juliette Alcideustin Zeigler, PharmD, BCPS.   Pager: [phone] 02/05/2016 1:02 PM

## 2016-02-05 NOTE — Progress Notes (Signed)
Pt stated that he would self administer CPAP when ready for bed.  Pt to notify RT if any assistance is required throughout the night.  RT to monitor and assess as needed.

## 2016-02-05 NOTE — Progress Notes (Signed)
PROGRESS NOTE  York Ramerry R Speegle  ZOX:[id number] DOB: June 22, 1961  DOA: 02/04/2016 PCP: No primary care provider on file.  Outpatient Specialists:  None  Brief Narrative:  55 year old male with history of HTN, OSA on nightly CPAP, hypothyroid, lower extremity edema on Lasix, morbid obesity, family history of DVT/PE in mother, presented to ED acute worsening of dyspnea over the last 2 weeks complicating chronic dyspnea, asymmetric left leg swelling and pain (acute on chronic) and CTA chest in ED confirmed submassive PE and right heart strain. EDP discussed with pulmonology who recommended IV heparin for a couple of days prior to transitioning to oral anticoagulation. Hemodynamically stable.   Assessment & Plan:   Principal Problem:   Acute pulmonary embolism (HCC) Active Problems:   Obstructive sleep apnea on CPAP   Lung nodule   Morbid obesity (HCC)   Essential hypertension   Acute deep vein thrombosis (DVT) of left lower extremity (HCC)   Hypothyroidism   Bilateral edema of lower extremity   Acute submassive pulmonary embolism secondary to left lower extremity DVT - CTA chest 4/22 confirmed acute PE with CT evidence of right heart strain consistent with at least some massive PE. - Lower extremity venous Doppler confirmed DVT in the left femoral, popliteal and gastroc veins. No DVT in RLE - PCCM was contacted by EDP and recommended IV heparin for a few days before transitioning to oral anticoagulation. - Continue IV heparin drip for additional 24 hours then transitioned to oral (discussed in detail with patient and spouse at bedside regarding options of warfarin versus NOAC's including risks and benefits of each option) - Duration of anticoagulation: At least 6 months and maybe longer. - Needs outpatient evaluation for hypercoagulable status given her strong family history of VTE and non-provoked the VTE - Obtain 2-D echo.  OSA - Continue nightly CPAP.  RML lung nodule -  Outpatient follow-up with repeat CT in 12 months.  Morbid obesity/Body mass index is 46.04 kg/(m^2). - Counseled regarding diet, subsequent exercise when medically cleared and weight loss.  Essential hypertension - Controlled on carvedilol.  Hypothyroidism - Check TSH. Noncompliant with Synthroid  Bilateral lower extremity edema - Unclear etiology. Continue furosemide. Obtain 2-D echo to check LV function.     DVT prophylaxis: Patient on full dose IV heparin infusion. Code Status: Full Family Communication: Discussed in detail with patient and spouse at bedside. Updated care and answered questions. Disposition Plan: DC home when medically stable, possibly in 2 days.   Consultants:   None  Procedures:   Bilateral lower extremity venous Dopplers: Vascular Ultrasound Lower extremity venous duplex has been completed. Preliminary findings: DVT noted in the Left femoral vein, popliteal vein, and gastroc veins. No DVT RLE.  Antimicrobials:   None    Subjective: States that he feels better. Denies chest pain or dyspnea or leg pain. Gives approximately 1 year history of intermittent DOE. No orthopnea or PND. Chronic asymmetric left leg swelling compared to right, treated with diuretics with some improvement. No history of recent travel. Never had screening colonoscopy-recommended that he should follow-up with PCP regarding this.  Objective:  Filed Vitals:   02/04/16 1712 02/04/16 2119 02/04/16 2245 02/05/16 0558  BP: 129/61 127/75  137/73  Pulse:  70  67  Temp: 98.2 F (36.8 C) 98.5 F (36.9 C)  98.1 F (36.7 C)  TempSrc: Oral Oral  Oral  Resp:  18 18 18   Height:      Weight:    158.26 kg (348 lb 14.4 oz)  SpO2:  98%  98%    Intake/Output Summary (Last 24 hours) at 02/05/16 1112 Last data filed at 02/05/16 0900  Gross per 24 hour  Intake 1482.89 ml  Output   2150 ml  Net -667.11 ml   Filed Weights   02/04/16 1410 02/05/16 0558  Weight: 158.986 kg (350 lb 8  oz) 158.26 kg (348 lb 14.4 oz)    Examination:  General exam: Moderately built and morbidly obese pleasant middle-aged male lying comfortably propped up in bed. Respiratory system: Clear to auscultation. Respiratory effort normal. Cardiovascular system: S1 & S2 heard, RRR.Marland Kitchen No JVD, murmurs, rubs, gallops or clicks. Trace bilateral pedal edema, left > right. Telemetry: Mostly sinus rhythm. Occasional sinus bradycardia >55 Gastrointestinal system: Abdomen is nondistended, soft and nontender. No organomegaly or masses felt. Normal bowel sounds heard. Central nervous system: Alert and oriented. No focal neurological deficits. Extremities: Symmetric 5 x 5 power. Left lower extremity diffusely mildly asymmetrically swollen compared to right but no other acute findings. Skin: No rashes, lesions or ulcers Psychiatry: Judgement and insight appear normal. Mood & affect appropriate.     Data Reviewed: I have personally reviewed following labs and imaging studies  CBC:  Recent Labs Lab 02/04/16 0900 02/05/16 0536  WBC 10.3 8.1  HGB 14.2 13.3  HCT 40.4 38.5*  MCV 88.8 92.3  PLT 180 160   Basic Metabolic Panel:  Recent Labs Lab 02/04/16 0900 02/05/16 0536  NA 137 139  K 3.9 4.0  CL 106 105  CO2 23 25  GLUCOSE 108* 121*  BUN 19 17  CREATININE 1.22 1.12  CALCIUM 9.0 8.6*   GFR: Estimated Creatinine Clearance: 118.7 mL/min (by C-G formula based on Cr of 1.12). Liver Function Tests:  Recent Labs Lab 02/04/16 0900  AST 18  ALT 24  ALKPHOS 83  BILITOT 0.9  PROT 7.1  ALBUMIN 3.9   No results for input(s): LIPASE, AMYLASE in the last 168 hours. No results for input(s): AMMONIA in the last 168 hours. Coagulation Profile:  Recent Labs Lab 02/04/16 1421  INR 1.11   Cardiac Enzymes:  Recent Labs Lab 02/04/16 0900  TROPONINI <0.03   BNP (last 3 results) No results for input(s): PROBNP in the last 8760 hours. HbA1C: No results for input(s): HGBA1C in the last 72  hours. CBG: No results for input(s): GLUCAP in the last 168 hours. Lipid Profile: No results for input(s): CHOL, HDL, LDLCALC, TRIG, CHOLHDL, LDLDIRECT in the last 72 hours. Thyroid Function Tests: No results for input(s): TSH, T4TOTAL, FREET4, T3FREE, THYROIDAB in the last 72 hours. Anemia Panel: No results for input(s): VITAMINB12, FOLATE, FERRITIN, TIBC, IRON, RETICCTPCT in the last 72 hours. Urine analysis: No results found for: COLORURINE, APPEARANCEUR, LABSPEC, PHURINE, GLUCOSEU, HGBUR, BILIRUBINUR, KETONESUR, PROTEINUR, UROBILINOGEN, NITRITE, LEUKOCYTESUR       Radiology Studies: Dg Chest 2 View  02/04/2016  CLINICAL DATA:  SOB x 2 weeks, worsening x 2-3 days ago, dry cough x 1 week HTN, never smoker No sx EXAM: CHEST - 2 VIEW COMPARISON:  None available FINDINGS: Relatively low lung volumes with some crowding of perihilar bronchovascular structures. No focal infiltrate or overt edema. Motion degrades the lateral radiograph. Heart size upper limits normal. No effusion.  No pneumothorax. Visualized skeletal structures are unremarkable. IMPRESSION: Low volumes.  No acute disease. Electronically Signed   By: Corlis Leak M.D.   On: 02/04/2016 10:14   Ct Angio Chest Pe W/cm &/or Wo Cm  02/04/2016  CLINICAL DATA:  Evaluate for pulmonary  embolus ; Pt c/o SOB with exertion and at rest x 2 days, also c/o left leg swelling x 1 week , today left calf pain Hx of HTN EXAM: CT ANGIOGRAPHY CHEST WITH CONTRAST TECHNIQUE: Multidetector CT imaging of the chest was performed using the standard protocol during bolus administration of intravenous contrast. Multiplanar CT image reconstructions and MIPs were obtained to evaluate the vascular anatomy. CONTRAST:  100 cc Isovue 370 COMPARISON:  Chest x-ray 02/04/2016 FINDINGS: Heart: Overall heart size is upper normal. The RV/LV ratio is 1.1. No pericardial effusion. Vascular structures: Pulmonary arteries are only moderately well opacified, limiting evaluation of  small pulmonary arteries. However there is significant thrombus in the right upper lobe and right lower lobe pulmonary artery branches. Although no definite emboli are identified in the left pulmonary artery system, smaller emboli are difficult to exclude. Mediastinum/thyroid: The visualized portion of the thyroid gland has a normal appearance. Moderate hiatal hernia is present. Lungs/Airways: In the right middle lobe there is a 4 x 4 mm nodule (for mm mean diameter) on image 49 of series 11. No focal consolidation or pleural effusion identified. No pulmonary edema. Upper abdomen: Hiatal hernia; otherwise unremarkable. Chest wall/osseous structures: Within the midline of the upper anterior chest there is a rounded soft tissue density mass measuring 3.4 x 2.2 cm anterior to the sternomanubrial junction. Visualized osseous structures have a normal appearance. Review of the MIP images confirms the above findings. IMPRESSION: 1. Positive for acute PE with CT evidence of right heart strain (RV/LV Ratio = 1.1) consistent with at least submassive (intermediate risk) PE. The presence of right heart strain has been associated with an increased risk of morbidity and mortality. Please activate Code PE by paging [phone]. 2. Right middle lobe nodule. No follow-up needed if patient is low-risk. Non-contrast chest CT can be considered in 12 months if patient is high-risk. This recommendation follows the consensus statement: Guidelines for Management of Incidental Pulmonary Nodules Detected on CT Images:From the Fleischner Society 2017; published online before print (10.1148/radiol.8295621308). 3. Rounded soft tissue density mass in the upper central anterior chest, anterior to the sternomanubrial junction. Findings could be related to small epidermal inclusion cyst or small solid mass. Correlation with physical exam findings recommended. Critical Value/emergent results were called by telephone at the time of interpretation on  02/04/2016 at 1:47 pm to Dr. Doug Sou , who verbally acknowledged these results. Electronically Signed   By: Norva Pavlov M.D.   On: 02/04/2016 13:47        Scheduled Meds: . carvedilol  12.5 mg Oral QHS  . furosemide  40 mg Oral q morning - 10a   Continuous Infusions: . heparin 1,950 Units/hr (02/04/16 2358)     LOS: 1 day    Time spent: 40 mins.    [facility], MD [facility] Pager [phone]  If 7PM-7AM, please contact night-coverage [email or web address] Password [email or web address] 02/05/2016, 11:12 AM

## 2016-02-06 LAB — CBC
HCT: 39.5 % (ref 39.0–52.0)
HEMOGLOBIN: 13.6 g/dL (ref 13.0–17.0)
MCH: 31.6 pg (ref 26.0–34.0)
MCHC: 34.4 g/dL (ref 30.0–36.0)
MCV: 91.9 fL (ref 78.0–100.0)
PLATELETS: 172 10*3/uL (ref 150–400)
RBC: 4.3 MIL/uL (ref 4.22–5.81)
RDW: 12.6 % (ref 11.5–15.5)
WBC: 8.9 10*3/uL (ref 4.0–10.5)

## 2016-02-06 LAB — HEPARIN LEVEL (UNFRACTIONATED): HEPARIN UNFRACTIONATED: 0.35 [IU]/mL (ref 0.30–0.70)

## 2016-02-06 MED ORDER — APIXABAN 5 MG PO TABS: 10.0000 mg | ORAL_TABLET | Freq: Two times a day (BID) | ORAL | Status: DC

## 2016-02-06 MED ORDER — APIXABAN 5 MG PO TABS: 5.0000 mg | ORAL_TABLET | Freq: Two times a day (BID) | ORAL | Status: DC

## 2016-02-06 MED ORDER — APIXABAN 5 MG PO TABS
10.0000 mg | ORAL_TABLET | Freq: Two times a day (BID) | ORAL | Status: DC
  Filled 2016-02-06: qty 2

## 2016-02-06 MED ORDER — APIXABAN 5 MG PO TABS
10.0000 mg | ORAL_TABLET | Freq: Two times a day (BID) | ORAL | Status: DC
  Administered 2016-02-06: 10 mg via ORAL
  Filled 2016-02-06 (×2): qty 2

## 2016-02-06 NOTE — Care Management Note (Signed)
Case Management Note  Patient Details  Name: [name] MRN: [id number] Date of Birth: [birth year]  Subjective/Objective:Referral for pcp-Provided patient w/pcp listing for [place], also informed patient to use-insurance cust service 1800 tel#, & pcp insurance booklet-encouraged need to choose & call pcp before d/c-Ptient voiced understanding. Pharmacy will provide Eliquis coupon to patient.                    Action/Plan:d/c plan home.   Expected Discharge Date:                 Expected Discharge Plan:  Home/Self Care  In-House Referral:  PCP / [facility]  Discharge planning Services     Post Acute Care Choice:    Choice offered to:     DME Arranged:    DME Agency:     HH Arranged:    HH Agency:     Status of Service:  In process, will continue to follow  Medicare Important Message Given:    Date Medicare IM Given:    Medicare IM give by:    Date Additional Medicare IM Given:    Additional Medicare Important Message give by:     If discussed at Long Length of Stay Meetings, dates discussed:    Additional Comments:  Lanier ClamMahabir, Jahnavi Muratore, RN 02/06/2016, 10:32 AM

## 2016-02-06 NOTE — Discharge Instructions (Addendum)
Information on my medicine - ELIQUIS (apixaban)  This medication education was reviewed with me or my healthcare representative as part of my discharge preparation.  The pharmacist that spoke with me during my hospital stay was:  Clance BollRunyon, Amanda, [facility]  Why was Eliquis prescribed for you? Eliquis was prescribed to treat blood clots that may have been found in the veins of your legs (deep vein thrombosis) or in your lungs (pulmonary embolism) and to reduce the risk of them occurring again.  What do You need to know about Eliquis ? The starting dose is 10 mg (two 5 mg tablets) taken TWICE daily for the FIRST SEVEN (7) DAYS, then on (enter date)  02/13/16  the dose is reduced to ONE 5 mg tablet taken TWICE daily.  Eliquis may be taken with or without food.   Try to take the dose about the same time in the morning and in the evening. If you have difficulty swallowing the tablet whole please discuss with your pharmacist how to take the medication safely.  Take Eliquis exactly as prescribed and DO NOT stop taking Eliquis without talking to the doctor who prescribed the medication.  Stopping may increase your risk of developing a new blood clot.  Refill your prescription before you run out.  After discharge, you should have regular check-up appointments with your healthcare provider that is prescribing your Eliquis.    What do you do if you miss a dose? If a dose of ELIQUIS is not taken at the scheduled time, take it as soon as possible on the same day and twice-daily administration should be resumed. The dose should not be doubled to make up for a missed dose.  Important Safety Information A possible side effect of Eliquis is bleeding. You should call your healthcare provider right away if you experience any of the following: ? Bleeding from an injury or your nose that does not stop. ? Unusual colored urine (red or dark brown) or unusual colored stools (red or black). ? Unusual bruising for  unknown reasons. ? A serious fall or if you hit your head (even if there is no bleeding).  Some medicines may interact with Eliquis and might increase your risk of bleeding or clotting while on Eliquis. To help avoid this, consult your healthcare provider or pharmacist prior to using any new prescription or non-prescription medications, including herbals, vitamins, non-steroidal anti-inflammatory drugs (NSAIDs) and supplements.  This website has more information on Eliquis (apixaban): http://www.eliquis.com/eliquis/home    Venous Thromboembolism Venous thromboembolism (VTE) is a condition in which a blood clot (thrombus) develops in the body. A thrombus usually occurs in a deep vein in the leg or the pelvis, but it can also occur in the arm. Sometimes, pieces of a thrombus can break off from its original place of development and travel through the bloodstream to other parts of the body. When that happens, the thrombus is called an embolism. An embolism can block the blood flow in the blood vessels of other organs. There are two serious types of VTE:  Deep vein thrombosis (DVT). A DVT is a thrombus that usually occurs in a deep, larger vein of the lower leg or the pelvis, or in an upper extremity such as the arm.  Pulmonary embolism (PE). A PE occurs when an embolism has formed and traveled to the lungs. A PE can block or decrease the blood flow in one lung or both lungs. VTE is a serious health condition that can cause disability or death.  It is very important to get help right away and to not ignore symptoms. CAUSES VTE is caused by the formation of a blood clot in your leg, pelvis, or arm. Usually, several things contribute to the formation of blood clots. A clot may develop when:  Your blood flow slows down.  Your vein becomes damaged in some way.  You have a condition that makes your blood clot more easily. RISK FACTORS A VTE is more likely to develop in:  People who are older,  especially over 66 years of age.  People who are overweight (obese).  People who sit or lie still for a long time, such as during long-distance travel (over 4 hours), bed rest, hospitalization, or during recovery from certain medical conditions like a stroke.  People who do not engage in much physical activity (sedentary lifestyle).  People who have chronic breathing disorders.  People who have a personal or family history of blood clots or blood clotting disease.  People who have peripheral vascular disease (PVD), diabetes, or some types of cancer.  People who have heart disease, especially if the person had a recent heart attack or has congestive heart failure.  People who have neurological diseases that affect the legs (leg paresis).  People who have had a traumatic injury, such as breaking a hip or leg.  People who have recently had major or lengthy surgery, especially on the hip, knee, or abdomen.  People who have had a central line placed inside a large vein.  People who take medicines that contain the hormone estrogen. These include birth control pills and hormone replacement therapy.  Pregnancy or during childbirth or the postpartum period.  Long plane flights (over 8 hours). SIGNS AND SYMPTOMS  Symptoms of VTE can depend on where the clot is located and whether the clot breaks off and travels to another organ. Sometimes, there may be no symptoms. Symptoms of a DVT can include:  Swelling of your leg or arm, especially if one side is much worse.  Warmth and redness of your leg or arm, especially if one side is much worse.  Pain in your arm or leg. If the clot is in your leg, symptoms may be more noticeable or worse when you stand or walk.  A feeling of pins and needles if the clot is in the arm. The symptoms of a PE usually start suddenly and include:  Shortness of breath while active or at rest.  Coughing or coughing up blood or blood-tinged mucus.  Chest pain  that is often worse with deep breaths.  Rapid or irregular heartbeat.  Feeling light-headed or dizzy.  Fainting.  Feeling anxious.  Sweating. There may also be pain and swelling in a leg if that is where the blood clot started. These symptoms may represent a serious problem that is an emergency. Do not wait to see if the symptoms will go away. Get medical help right away. Call your local emergency services (911 in the [place] Do not drive yourself to the hospital. DIAGNOSIS Your health care provider will take a medical history and perform a physical exam. You may also have other tests, including:  Blood tests to assess the clotting properties of your blood.  Imaging tests, such as CT, ultrasound, MRI, X-ray, and other tests to see if you have clots anywhere in your body.  An electrocardiogram (ECG) to look for heart strain from blood clots in the lungs.  An echocardiogram. TREATMENT After a VTE is identified, it can be  treated. The main goals of treatment are:  To stop a blood clot from growing larger.  To stop new blood clots from forming.  To stop a blood clot from traveling to the lungs (pulmonary embolism). The type of treatment that you receive depends on many factors, such as the cause of your VTE, your risk for bleeding or developing more clots, and other medical conditions that you have. Sometimes, a combination of treatments is necessary. Treatment options may be combined and include:  Monitoring the blood clot with ultrasound.  Taking medicines by mouth, such as newer blood thinners (anticoagulants), thrombolytics, or warfarin.  Taking anticoagulant medicine by injection or through an IV tube.  Wearingcompression stockings or using different types of devices.  Surgery (rare) to remove the blood clot or to place a filter in your abdomen to stop the blood clot from traveling to your lungs. Treatments for VTE are often divided into immediate treatment and long-term  treatment (up to 3 months after VTE). You can work with your health care provider to choose the treatment program that is best for you. HOME CARE INSTRUCTIONS If you are taking a newer oral anticoagulant:  Take the medicine every single day at the same time each day.  Understand what foods and drugs interact with this medicine.  Understand that there are no regular blood tests required when using this medicine.  Understand the side effects of this medicine, including excessive bruising or bleeding. Ask your health care provider or pharmacist about other possible side effects. If you are taking warfarin:  Understand how to take warfarin and know which foods can affect how warfarin works in Public relations account executive.  Understand that it is dangerous to take too much or too little warfarin. Too much warfarin increases the risk of bleeding. Too little warfarin continues to allow the risk for blood clots.  Follow your PT and INR blood testing schedule. The PT and INR results allow your health care provider to adjust your dose of warfarin. It is very important that you have your PT and INR tested as often as told by your health care provider.  Avoid major changes in your diet, or tell your health care provider before you change your diet. Arrange a visit with a registered dietitian to answer your questions. Many foods, especially foods that are high in vitamin K, can interfere with warfarin and affect the PT and INR results. Eat a consistent amount of foods that are high in vitamin K, such as:  Spinach, kale, broccoli, cabbage, collard greens, turnip greens, Brussels sprouts, peas, cauliflower, seaweed, and parsley.  Beef liver and pork liver.  Green tea.  Soybean oil.  Tell your health care provider about any and all medicines, vitamins, and supplements that you take, including aspirin and other over-the-counter anti-inflammatory medicines. Be especially cautious with aspirin and anti-inflammatory  medicines. Do not take those before you ask your health care provider if it is safe to do so. This is important because many medicines can interfere with warfarin and affect the PT and INR results.  Do not start or stop taking any over-the-counter or prescription medicine unless your health care provider or pharmacist tells you to do so. If you take warfarin, you will also need to do these things:  Hold pressure over cuts for longer than usual.  Tell your dentist and other health care providers that you are taking warfarin before you have any procedures in which bleeding may occur.  Avoid alcohol or drink very small amounts.  Tell your health care provider if you change your alcohol intake.  Do not use tobacco products, including cigarettes, chewing tobacco, and e-cigarettes. If you need help quitting, ask your health care provider.  Avoid contact sports. General Instructions  Take over-the-counter and prescription medicines only as told by your health care provider. Anticoagulant medicines can have side effects, including easy bruising and difficulty stopping bleeding. If you are prescribed an anticoagulant, you will also need to do these things:  Hold pressure over cuts for longer than usual.  Tell your dentist and other health care providers that you are taking anticoagulants before you have any procedures in which bleeding may occur.  Avoid contact sports.  Wear a medical alert bracelet or carry a medical alert card that says you have had a PE.  Ask your health care provider how soon you can go back to your normal activities. Stay active to prevent new blood clots from forming.  Make sure to exercise while traveling or when you have been sitting or standing for a long period of time. It is very important to exercise. Exercise your legs by walking or by tightening and relaxing your leg muscles often. Take frequent walks.  Wear compression stockings as told by your health care provider  to help prevent more blood clots from forming.  Do not use tobacco products, including cigarettes, chewing tobacco, and e-cigarettes. If you need help quitting, ask your health care provider.  Keep all follow-up appointments with your health care provider. This is important. PREVENTION Take these actions to decrease your risk of developing another VTE:  Exercise regularly. For at least 30 minutes every day, engage in:  Activity that involves moving your arms and legs.  Activity that encourages good blood flow through your body by increasing your heart rate.  Exercise your arms and legs every hour during long-distance travel (over 4 hours). Drink plenty of water and avoid drinking alcohol while traveling.  Avoid sitting or lying in bed for long periods of time without moving your legs.  Maintain a weight that is appropriate for your height. Ask your health care provider what weight is healthy for you.  If you are a woman who is over 34 years of age, avoid unnecessary use of medicines that contain estrogen. These include birth control pills.  Do not smoke, especially if you take estrogen medicines. If you need help quitting, ask your health care provider. If you are hospitalized, prevention measures may include:  Early walking after surgery, as soon as your health care provider says that it is safe.  Receiving anticoagulants to prevent blood clots.If you cannot take anticoagulants, other options may be available, such as wearing compression stockings or using different types of devices. SEEK IMMEDIATE MEDICAL CARE IF:  You have new or increased pain, swelling, or redness in an arm or leg.  You have numbness or tingling in an arm or leg.  You have shortness of breath while active or at rest.  You have chest pain.  You have a rapid or irregular heartbeat.  You feel light-headed or dizzy.  You cough up blood.  You notice blood in your vomit, bowel movement, or urine. These  symptoms may represent a serious problem that is an emergency. Do not wait to see if the symptoms will go away. Get medical help right away. Call your local emergency services (911 in the [place] Do not drive yourself to the hospital.   This information is not intended to replace advice given to  you by your health care provider. Make sure you discuss any questions you have with your health care provider.   Document Released: 07/29/2009 Document Revised: 06/22/2015 Document Reviewed: 01/26/2015 Elsevier Interactive Patient Education [employer]

## 2016-02-06 NOTE — Care Management Note (Signed)
Case Management Note  Patient Details  Name: York Ramerry R Reny MRN: [id number] Date of Birth: 1960-12-29  Subjective/Objective: Per pharmacy-Amanda has given patient eliquis 30day free discount coupon. Patient has contacted [facility] for pcp-they are awaiting a call back(patient is a+ox3-says he will get his own pcp) MD notified.                  Action/Plan:d/c plan home no further d/c needs or orders.   Expected Discharge Date:                Expected Discharge Plan:  Home/Self Care  In-House Referral:  PCP / [facility]  Discharge planning Services     Post Acute Care Choice:    Choice offered to:     DME Arranged:    DME Agency:     HH Arranged:    HH Agency:     Status of Service:  Completed, signed off  Medicare Important Message Given:    Date Medicare IM Given:    Medicare IM give by:    Date Additional Medicare IM Given:    Additional Medicare Important Message give by:     If discussed at Long Length of Stay Meetings, dates discussed:    Additional Comments:  Lanier ClamMahabir, Jameer Storie, RN 02/06/2016, 2:39 PM

## 2016-02-06 NOTE — Progress Notes (Signed)
ANTICOAGULATION CONSULT NOTE   Pharmacy Consult for IV heparin >> apixaban Indication: pulmonary embolus/DVT  No Known Allergies  Patient Measurements: Height: 6\' 1"  (185.4 cm) Weight: (!) 331 lb 1.6 oz (150.186 kg) IBW/kg (Calculated) : 79.9 Heparin Dosing Weight: 103 kg  Vital Signs: Temp: 98.8 F (37.1 C) (04/24 0545) Temp Source: Oral (04/24 0545) BP: 142/80 mmHg (04/24 0545) Pulse Rate: 58 (04/24 0545)  Labs:  Recent Labs  02/04/16 0900 02/04/16 1421  02/05/16 0536 02/05/16 1143 02/06/16 0513  HGB 14.2  --   --  13.3  --  13.6  HCT 40.4  --   --  38.5*  --  39.5  PLT 180  --   --  160  --  172  APTT  --  28  --   --   --   --   LABPROT  --  14.1  --   --   --   --   INR  --  1.11  --   --   --   --   HEPARINUNFRC  --   --   < > 0.37 0.36 0.35  CREATININE 1.22  --   --  1.12  --   --   TROPONINI <0.03  --   --   --   --   --   < > = values in this interval not displayed.  Estimated Creatinine Clearance: 115.2 mL/min (by C-G formula based on Cr of 1.12).   Medical History: Past Medical History  Diagnosis Date  . Hypertension   . Thyroid disease   . Sleep apnea     Medications:  Prescriptions prior to admission  Medication Sig Dispense Refill Last Dose  . carvedilol (COREG) 12.5 MG tablet Take 12.5 mg by mouth at bedtime.    02/03/2016 at 9pm  . dextromethorphan-guaiFENesin (MUCINEX DM) 30-600 MG 12hr tablet Take 1 tablet by mouth 2 (two) times daily as needed for cough.   Past Week at Unknown time  . furosemide (LASIX) 40 MG tablet Take 40 mg by mouth every morning.    Past Month at Unknown time   Scheduled:  . carvedilol  12.5 mg Oral QHS  . furosemide  40 mg Oral q morning - 10a   Infusions:  . heparin 1,950 Units/hr (02/06/16 0251)    Assessment: 54 yoM c/o SOB with exertion and rest x 2 days and left leg swelling x 1 week.  IV heparin per Rx for submassive PE.   Dopplers (4/22) showed + DVT in left femoral vein. Baseline INR = 1.11. Per [facility],  PCCM recommends several days of IV heparin gtt. Plan to switch to Eliquis today (4/24) after 48 hours of IV heparin.  02/06/2016:  Heparin level level remains therapeutic at 0.35 with heparin infusing @ 1950 units/hr  CBC: Hgb and pltc remain WNL  Goal of Therapy:  Heparin level 0.3-0.7 units/ml Monitor platelets by anticoagulation protocol: Yes   Plan:   Start apixaban today with 10mg  BID x 7 days followed by 5mg  BID starting 02/13/16.  Discontinue heparin at time of first dose of apixaban  Monitor for bleeding  Will provide education prior to discharge  Grace IsaacYuhong Liu, PharmD candidate 02/06/2016@1 :33 PM    I agree with the student's assessment and plan. Clance BollAmanda Makayla Lanter, PharmD Pager: [phone]

## 2016-02-06 NOTE — Discharge Summary (Addendum)
Physician Discharge Summary  [name]  ZOX:[id number]  DOB: [birth year]  DOA: 02/04/2016  PCP: No primary care provider on file.  Admit date: 02/04/2016 Discharge date: 02/06/2016  Time spent: Greater than 30 minutes  Recommendations for Outpatient Follow-up:  1. [facility] [place]/PCP on 02/14/16 at 11:30 AM. 2. Recommend outpatient evaluation for hypercoagulable state, when appropriate.  Discharge Diagnoses:  Principal Problem:   Acute pulmonary embolism (HCC) Active Problems:   Obstructive sleep apnea on CPAP   Lung nodule   Morbid obesity (HCC)   Essential hypertension   Acute deep vein thrombosis (DVT) of left lower extremity (HCC)   Hypothyroidism   Bilateral edema of lower extremity   Discharge Condition: Improved & Stable  Diet recommendation: Heart healthy diet.  Filed Weights   02/04/16 1410 02/05/16 0558 02/06/16 0545  Weight: 158.986 kg (350 lb 8 oz) 158.26 kg (348 lb 14.4 oz) 150.186 kg (331 lb 1.6 oz)    History of present illness:  55 year old male with history of HTN, OSA on nightly CPAP, hypothyroid, lower extremity edema on Lasix, morbid obesity, family history of DVT/PE in mother, presented to ED acute worsening of dyspnea over the last 2 weeks complicating chronic dyspnea, asymmetric left leg swelling and pain (acute on chronic) and CTA chest in ED confirmed submassive PE and right heart strain. EDP discussed with pulmonology who recommended IV heparin for a couple of days prior to transitioning to oral anticoagulation. Hemodynamically stable.  Hospital Course:   Acute submassive pulmonary embolism secondary to left lower extremity DVT - CTA chest 4/22 confirmed acute PE with CT evidence of right heart strain consistent with at least sub massive PE. - Lower extremity venous Doppler confirmed DVT in the left femoral, popliteal and gastroc veins. No DVT in RLE - PCCM was contacted by EDP and recommended IV heparin for  a couple of days before transitioning to oral anticoagulation. - He was treated with IV heparin drip per pharmacy for approximately 48 hours and then transitioned to oral Eliquis as per his and spouse preference (discussed in detail with patient and spouse at bedside regarding options of warfarin versus NOAC's including risks and benefits of each option) - Duration of anticoagulation: At least 6 months and maybe longer. - Needs outpatient evaluation for hypercoagulable status given her strong family history of VTE and non-provoked the VTE - 2-D echo results as below, suboptimal study secondary to body habitus. LVEF 55-60 percent. RV poorly visualized. Systolic function normal. - Discussed with CCM MD on call today who recommended that it was appropriate to transition patient to oral anticoagulants today and DC home.  OSA - Continue nightly CPAP.  RML lung nodule - Outpatient follow-up with repeat CT in 12 months.  Morbid obesity/Body mass index is 46.04 kg/(m^2). - Counseled regarding diet, subsequent exercise when medically cleared and weight loss.  Essential hypertension - Controlled on carvedilol.  ? Hypothyroidism - TSH: 2.698. Has not really taken Synthroid. Not really sure if he has hypothyroid. Outpatient follow-up.  Bilateral lower extremity edema - Unclear etiology. Continue furosemide. 2-D echo as below. LV function okay.  Health maintenance - Never had screening colonoscopy-recommended that he should follow-up with PCP regarding this.  Mild to moderate aortic root dilatation, seen on echo - Outpatient follow-up as deemed necessary.  Soft tissue mass, upper mid anterior chest - Seen on CT. Patient states that he has had this for 30+ years and it has not changed.    Consultants:  None  Procedures:   Bilateral lower extremity venous Dopplers: Summary:  - Findings consistent with acute deep vein thrombosis involving the  left femoral vein, left popliteal vein,  and left gastrocnemius  vein. - No evidence of deep vein thrombosis involving the right lower  extremity. - No evidence of Baker&'s cyst on the right or left.   2-D echo 02/05/16: Study Conclusions  - Procedure narrative: Transthoracic echocardiography. Image  quality was suboptimal. The study was technically difficult, as a  result of poor sound wave transmission and body habitus. - Left ventricle: The cavity size was normal. There was mild  concentric hypertrophy. Diastolic dysfunction, grade  indeterminate. Normal filling pressures. Systolic function was  normal. The estimated ejection fraction was in the range of 55%  to 60%. Images were inadequate for LV wall motion assessment. - Ventricular septum: Septal motion showed abnormal function and  dyssynergy. - Aorta: Mild to moderate aortic root dilatation. Aortic root  dimension: 44 mm (ED).   Discharge Exam:  Complaints: Denies complaints. No chest pain, dyspnea, dizziness or lightheadedness. Mild occasional tinge of pain in left lower extremity which is transient. No bleeding reported. Ambulating to the bathroom. 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  Filed Vitals:   02/05/16 2126 02/05/16 2143 02/06/16 0545 02/06/16 1431  BP: 140/71  142/80 161/83  Pulse: 71  58 75  Temp: 98.4 F (36.9 C)  98.8 F (37.1 C) 98.6 F (37 C)  TempSrc: Oral  Oral Oral  Resp: Height:      Weight:   150.186 kg (331 lb 1.6 oz)   SpO2: 98%  98% 98%    General exam: Moderately built and morbidly obese male lying comfortably propped up in bed. Respiratory system: Clear. No increased work of breathing. ~ 3 cm diameter soft superficial mass at level of sternomanubrium junction without acute findings. Non pulsatile. Cardiovascular system: S1 & S2 heard, RRR. No JVD, murmurs, gallops, clicks or pedal edema. Gastrointestinal system: Abdomen is nondistended, soft and nontender. Normal bowel sounds heard. Central  nervous system: Alert and oriented. No focal neurological deficits. Extremities: Symmetric 5 x 5 power. Left lower extremity diffusely mildly asymmetrically swollen compared to right but no other acute findings.  Discharge Instructions      Discharge Instructions    Call MD for:  difficulty breathing, headache or visual disturbances    Complete by:  As directed      Call MD for:  extreme fatigue    Complete by:  As directed      Call MD for:  persistant dizziness or light-headedness    Complete by:  As directed      Call MD for:  persistant nausea and vomiting    Complete by:  As directed      Call MD for:  severe uncontrolled pain    Complete by:  As directed      Call MD for:  temperature >100.4    Complete by:  As directed      Diet - low sodium heart healthy    Complete by:  As directed      Increase activity slowly    Complete by:  As directed             Medication List    TAKE these medications        apixaban 5 MG Tabs tablet  Commonly known as:  ELIQUIS  Take 2 tablets (10 mg total) by mouth  2 (two) times daily.     apixaban 5 MG Tabs tablet  Commonly known as:  ELIQUIS  Take 1 tablet (5 mg total) by mouth 2 (two) times daily.  Start taking on:  02/13/2016     COREG 12.5 MG tablet  Generic drug:  carvedilol  Take 12.5 mg by mouth at bedtime.     dextromethorphan-guaiFENesin 30-600 MG 12hr tablet  Commonly known as:  MUCINEX DM  Take 1 tablet by mouth 2 (two) times daily as needed for cough.     LASIX 40 MG tablet  Generic drug:  furosemide  Take 40 mg by mouth every morning.       Follow-up Information    Follow up with Family Doctor of choice. Schedule an appointment as soon as possible for a visit in 5 days.      Get Medicines reviewed and adjusted: Please take all your medications with you for your next visit with your Primary MD  Please request your Primary MD to go over all hospital tests and procedure/radiological results at the follow up.  Please ask your Primary MD to get all Hospital records sent to his/her office.  If you experience worsening of your admission symptoms, develop shortness of breath, life threatening emergency, suicidal or homicidal thoughts you must seek medical attention immediately by calling 911 or calling your MD immediately if symptoms less severe.  You must read complete instructions/literature along with all the possible adverse reactions/side effects for all the Medicines you take and that have been prescribed to you. Take any new Medicines after you have completely understood and accept all the possible adverse reactions/side effects.   Do not drive when taking pain medications.   Do not take more than prescribed Pain, Sleep and Anxiety Medications  Special Instructions: If you have smoked or chewed Tobacco in the last 2 yrs please stop smoking, stop any regular Alcohol and or any Recreational drug use.  Wear Seat belts while driving.  Please note  You were cared for by a hospitalist during your hospital stay. Once you are discharged, your primary care physician will handle any further medical issues. Please note that NO REFILLS for any discharge medications will be authorized once you are discharged, as it is imperative that you return to your primary care physician (or establish a relationship with a primary care physician if you do not have one) for your aftercare needs so that they can reassess your need for medications and monitor your lab values.    The results of significant diagnostics from this hospitalization (including imaging, microbiology, ancillary and laboratory) are listed below for reference.    Significant Diagnostic Studies: Dg Chest 2 View  02/04/2016  CLINICAL DATA:  SOB x 2 weeks, worsening x 2-3 days ago, dry cough x 1 week HTN, never smoker No sx EXAM: CHEST - 2 VIEW COMPARISON:  None available FINDINGS: Relatively low lung volumes with some crowding of perihilar  bronchovascular structures. No focal infiltrate or overt edema. Motion degrades the lateral radiograph. Heart size upper limits normal. No effusion.  No pneumothorax. Visualized skeletal structures are unremarkable. IMPRESSION: Low volumes.  No acute disease. Electronically Signed   By: Corlis Leak  Hassell M.D.   On: 02/04/2016 10:14   Ct Angio Chest Pe W/cm &/or Wo Cm  02/04/2016  CLINICAL DATA:  Evaluate for pulmonary embolus ; Pt c/o SOB with exertion and at rest x 2 days, also c/o left leg swelling x 1 week , today left calf pain Hx  of HTN EXAM: CT ANGIOGRAPHY CHEST WITH CONTRAST TECHNIQUE: Multidetector CT imaging of the chest was performed using the standard protocol during bolus administration of intravenous contrast. Multiplanar CT image reconstructions and MIPs were obtained to evaluate the vascular anatomy. CONTRAST:  100 cc Isovue 370 COMPARISON:  Chest x-ray 02/04/2016 FINDINGS: Heart: Overall heart size is upper normal. The RV/LV ratio is 1.1. No pericardial effusion. Vascular structures: Pulmonary arteries are only moderately well opacified, limiting evaluation of small pulmonary arteries. However there is significant thrombus in the right upper lobe and right lower lobe pulmonary artery branches. Although no definite emboli are identified in the left pulmonary artery system, smaller emboli are difficult to exclude. Mediastinum/thyroid: The visualized portion of the thyroid gland has a normal appearance. Moderate hiatal hernia is present. Lungs/Airways: In the right middle lobe there is a 4 x 4 mm nodule (for mm mean diameter) on image 49 of series 11. No focal consolidation or pleural effusion identified. No pulmonary edema. Upper abdomen: Hiatal hernia; otherwise unremarkable. Chest wall/osseous structures: Within the midline of the upper anterior chest there is a rounded soft tissue density mass measuring 3.4 x 2.2 cm anterior to the sternomanubrial junction. Visualized osseous structures have a normal  appearance. Review of the MIP images confirms the above findings. IMPRESSION: 1. Positive for acute PE with CT evidence of right heart strain (RV/LV Ratio = 1.1) consistent with at least submassive (intermediate risk) PE. The presence of right heart strain has been associated with an increased risk of morbidity and mortality. Please activate Code PE by paging [phone]. 2. Right middle lobe nodule. No follow-up needed if patient is low-risk. Non-contrast chest CT can be considered in 12 months if patient is high-risk. This recommendation follows the consensus statement: Guidelines for Management of Incidental Pulmonary Nodules Detected on CT Images:From the Fleischner Society 2017; published online before print (10.1148/radiol.0981191478). 3. Rounded soft tissue density mass in the upper central anterior chest, anterior to the sternomanubrial junction. Findings could be related to small epidermal inclusion cyst or small solid mass. Correlation with physical exam findings recommended. Critical Value/emergent results were called by telephone at the time of interpretation on 02/04/2016 at 1:47 pm to Dr. Doug Sou , who verbally acknowledged these results. Electronically Signed   By: Norva Pavlov M.D.   On: 02/04/2016 13:47    Microbiology: No results found for this or any previous visit (from the past 240 hour(s)).   Labs: Basic Metabolic Panel:  Recent Labs Lab 02/04/16 0900 02/05/16 0536  NA 137 139  K 3.9 4.0  CL 106 105  CO2 23 25  GLUCOSE 108* 121*  BUN 19 17  CREATININE 1.22 1.12  CALCIUM 9.0 8.6*   Liver Function Tests:  Recent Labs Lab 02/04/16 0900  AST 18  ALT 24  ALKPHOS 83  BILITOT 0.9  PROT 7.1  ALBUMIN 3.9   No results for input(s): LIPASE, AMYLASE in the last 168 hours. No results for input(s): AMMONIA in the last 168 hours. CBC:  Recent Labs Lab 02/04/16 0900 02/05/16 0536 02/06/16 0513  WBC 10.3 8.1 8.9  HGB 14.2 13.3 13.6  HCT 40.4 38.5* 39.5   MCV 88.8 92.3 91.9  PLT 180 160 172   Cardiac Enzymes:  Recent Labs Lab 02/04/16 0900  TROPONINI <0.03   BNP: BNP (last 3 results)  Recent Labs  02/04/16 0900  BNP 36.4    ProBNP (last 3 results) No results for input(s): PROBNP in the last 8760 hours.  CBG: No results  for input(s): GLUCAP in the last 168 hours.   Discussed at length with patients spouse at bedside, updated care and answered questions.  Signed:  Marcellus Scott, MD, FACP, FHM. [facility] Pager [phone]  If 7PM-7AM, please contact night-coverage [email or web address] Password [email or web address] 02/06/2016, 3:21 PM

## 2016-02-13 ENCOUNTER — Telehealth: Payer: Self-pay | Admitting: Behavioral Health

## 2016-02-13 ENCOUNTER — Encounter: Payer: Self-pay | Admitting: Behavioral Health

## 2016-02-13 NOTE — Telephone Encounter (Signed)
Pre-Visit Call completed with patient and chart updated.   Pre-Visit Info documented in Specialty Comments under [employer].    

## 2016-02-14 ENCOUNTER — Encounter: Payer: Self-pay | Admitting: Physician Assistant

## 2016-02-14 ENCOUNTER — Ambulatory Visit (INDEPENDENT_AMBULATORY_CARE_PROVIDER_SITE_OTHER): Payer: BLUE CROSS/BLUE SHIELD | Admitting: Physician Assistant

## 2016-02-14 VITALS — BP 124/88 | HR 67 | Temp 97.8°F | Resp 16 | Ht 73.0 in | Wt 349.2 lb

## 2016-02-14 DIAGNOSIS — I82432 Acute embolism and thrombosis of left popliteal vein: Secondary | ICD-10-CM

## 2016-02-14 DIAGNOSIS — R911 Solitary pulmonary nodule: Secondary | ICD-10-CM

## 2016-02-14 DIAGNOSIS — R600 Localized edema: Secondary | ICD-10-CM

## 2016-02-14 DIAGNOSIS — Z9989 Dependence on other enabling machines and devices: Secondary | ICD-10-CM

## 2016-02-14 DIAGNOSIS — G4733 Obstructive sleep apnea (adult) (pediatric): Secondary | ICD-10-CM

## 2016-02-14 DIAGNOSIS — I10 Essential (primary) hypertension: Secondary | ICD-10-CM

## 2016-02-14 DIAGNOSIS — I2699 Other pulmonary embolism without acute cor pulmonale: Secondary | ICD-10-CM | POA: Diagnosis not present

## 2016-02-14 DIAGNOSIS — E039 Hypothyroidism, unspecified: Secondary | ICD-10-CM

## 2016-02-14 DIAGNOSIS — I82412 Acute embolism and thrombosis of left femoral vein: Secondary | ICD-10-CM | POA: Diagnosis not present

## 2016-02-14 MED ORDER — CARVEDILOL 12.5 MG PO TABS
12.5000 mg | ORAL_TABLET | Freq: Every day | ORAL | Status: DC
Start: 2016-02-14 — End: 2016-06-16

## 2016-02-14 MED ORDER — FUROSEMIDE 40 MG PO TABS: 40.0000 mg | ORAL_TABLET | Freq: Two times a day (BID) | ORAL | Status: DC

## 2016-02-14 MED ORDER — POTASSIUM CHLORIDE CRYS ER 20 MEQ PO TBCR: 20.0000 meq | EXTENDED_RELEASE_TABLET | Freq: Every day | ORAL | Status: DC

## 2016-02-14 MED ORDER — APIXABAN 5 MG PO TABS: 5.0000 mg | ORAL_TABLET | Freq: Two times a day (BID) | ORAL | Status: DC

## 2016-02-14 MED ORDER — FLUTICASONE PROPIONATE 50 MCG/ACT NA SUSP: 2.0000 | Freq: Every day | NASAL | Status: DC

## 2016-02-14 NOTE — Progress Notes (Signed)
Patient presents to clinic today to establish care. Patient is a hospital follow-up for DVT (left femoral, popliteal and gastroic veins) and Acute PE. Patient was admitted to hospital after presenting to ER on 02/04/16 with complaints of progressively worsening SOB and chest discomfort x 1 week associated with swelling in LLE (acute on chronic). CTA in ER revealed submassive E with right heart stain. Patient was treated with IV Heparin for 48 hours before being transitioned to PO Eliquis. Echocardiogram was obtained witrh LVEF 55-60% but study felt to be suboptimal due to body habitus. Mild aortic root dilatation noted as well.  Patient was discharged home on 02/06/2016. Outpatient hypercoagulable workup recommended. Patient to remain on oral anticoagulant for at least 6 months.   Of note CTA revealed an RML lung nodule. Repeat CT in 12 months recommended.   Patient also with history of hypothyroidism in chart. Is not currently on any medications. TSH checked during hospitalization was essentially normal so this diagnosis is questionable.   Since discharge, patient endorses doing well overall. Denies chest pain or SOB. Is taking Carvedilol and Eliquis as directed. Is taking Lasix with some improvement in swelling, but patient notes that swelling returns in the evening. Denies PND or orthopnea. Denies new or worsening symptoms.   Past Medical History  Diagnosis Date  . Hypertension   . Thyroid disease     Unfounded  . Sleep apnea   . Pulmonary embolism ([place])   . Deep vein thrombosis (DVT) of left lower extremity ([place])   . History of chicken pox   . Measles   . GERD (gastroesophageal reflux disease)     Past Surgical History  Procedure Laterality Date  . Foot surgery      Chain saw accident    Current Outpatient Prescriptions on File Prior to Visit  Medication Sig Dispense Refill  . dextromethorphan-guaiFENesin (MUCINEX DM) 30-600 MG 12hr tablet Take 1 tablet by mouth 2 (two) times  daily as needed for cough.     No current facility-administered medications on file prior to visit.    No Known Allergies  Family History  Problem Relation Age of Onset  . Hypertension Father     Living  . Heart disease Mother     Living  . Pulmonary embolism Mother   . Hypertension Other     Paternal Side  . Anemia Mother   . Hypertension Sister     x2  . Migraines Sister   . Allergies Son     x1    Social History   Social History  . Marital Status: Married    Spouse Name: N/A  . Number of Children: N/A  . Years of Education: N/A   Occupational History  . Not on file.   Social History Main Topics  . Smoking status: Never Smoker   . Smokeless tobacco: Not on file  . Alcohol Use: No  . Drug Use: No  . Sexual Activity: No   Other Topics Concern  . Not on file   Social History Narrative   Review of Systems  Constitutional: Negative for fever, weight loss and malaise/fatigue.  HENT: Negative for hearing loss.   Respiratory: Positive for shortness of breath.   Cardiovascular: Positive for chest pain and leg swelling. Negative for palpitations.  Musculoskeletal: Positive for myalgias. Negative for falls.  Neurological: Negative for dizziness, loss of consciousness and headaches.  Psychiatric/Behavioral: Negative for depression, suicidal ideas, hallucinations, memory loss and substance abuse. The patient is not nervous/anxious  and does not have insomnia.    BP 124/88 mmHg  Pulse 67  Temp(Src) 97.8 F (36.6 C) (Oral)  Resp 16  Ht '6\' 1"'  (1.854 m)  Wt 349 lb 4 oz (158.419 kg)  BMI 46.09 kg/m2  SpO2 97%  Physical Exam  Constitutional: He is oriented to person, place, and time and well-developed, well-nourished, and in no distress.  HENT:  Head: Normocephalic and atraumatic.  Eyes: Conjunctivae are normal.  Cardiovascular: Normal rate, regular rhythm, normal heart sounds and intact distal pulses.   Pulses:      Popliteal pulses are 2+ on the right side,  and 2+ on the left side.       Dorsalis pedis pulses are 2+ on the right side, and 2+ on the left side.       Posterior tibial pulses are 2+ on the right side, and 2+ on the left side.  Swelling of bilateral lower extremities noted with moderate pitting edema of LLE. LLE substantially larger thatn RLE, consistent with multiple DVT.   Pulmonary/Chest: Effort normal and breath sounds normal. No respiratory distress. He has no wheezes. He has no rales. He exhibits no tenderness.  Abdominal: Soft. Bowel sounds are normal. He exhibits no distension. There is no tenderness.  Neurological: He is alert and oriented to person, place, and time. No cranial nerve deficit.  Skin: Skin is warm and dry. No rash noted.  Psychiatric: Affect normal.  Vitals reviewed.   Recent Results (from the past 2160 hour(s))  CBC     Status: None   Collection Time: 02/04/16  9:00 AM  Result Value Ref Range   WBC 10.3 4.0 - 10.5 K/uL   RBC 4.55 4.22 - 5.81 MIL/uL   Hemoglobin 14.2 13.0 - 17.0 g/dL   HCT 40.4 39.0 - 52.0 %   MCV 88.8 78.0 - 100.0 fL   MCH 31.2 26.0 - 34.0 pg   MCHC 35.1 30.0 - 36.0 g/dL   RDW 12.2 11.5 - 15.5 %   Platelets 180 150 - 400 K/uL  Comprehensive metabolic panel     Status: Abnormal   Collection Time: 02/04/16  9:00 AM  Result Value Ref Range   Sodium 137 135 - 145 mmol/L   Potassium 3.9 3.5 - 5.1 mmol/L   Chloride 106 101 - 111 mmol/L   CO2 23 22 - 32 mmol/L   Glucose, Bld 108 (H) 65 - 99 mg/dL   BUN 19 6 - 20 mg/dL   Creatinine, Ser 1.22 0.61 - 1.24 mg/dL   Calcium 9.0 8.9 - 10.3 mg/dL   Total Protein 7.1 6.5 - 8.1 g/dL   Albumin 3.9 3.5 - 5.0 g/dL   AST 18 15 - 41 U/L   ALT 24 17 - 63 U/L   Alkaline Phosphatase 83 38 - 126 U/L   Total Bilirubin 0.9 0.3 - 1.2 mg/dL   GFR calc non Af Amer >60 >60 mL/min   GFR calc Af Amer >60 >60 mL/min    Comment: (NOTE) The eGFR has been calculated using the CKD EPI equation. This calculation has not been validated in all clinical  situations. eGFR's persistently <60 mL/min signify possible Chronic Kidney Disease.    Anion gap 8 5 - 15  D-dimer, quantitative (not at [facility])     Status: Abnormal   Collection Time: 02/04/16  9:00 AM  Result Value Ref Range   D-Dimer, Quant 16.51 (H) 0.00 - 0.50 ug/mL-FEU    Comment: (NOTE) At the manufacturer cut-off of  0.50 ug/mL FEU, this assay has been documented to exclude PE with a sensitivity and negative predictive value of 97 to 99%.  At this time, this assay has not been approved by the FDA to exclude DVT/VTE. Results should be correlated with clinical presentation.   Brain natriuretic peptide     Status: None   Collection Time: 02/04/16  9:00 AM  Result Value Ref Range   B Natriuretic Peptide 36.4 0.0 - 100.0 pg/mL  Troponin I     Status: None   Collection Time: 02/04/16  9:00 AM  Result Value Ref Range   Troponin I <0.03 <0.031 ng/mL    Comment:        NO INDICATION OF MYOCARDIAL INJURY.   APTT     Status: None   Collection Time: 02/04/16  2:21 PM  Result Value Ref Range   aPTT 28 24 - 37 seconds  Protime-INR     Status: None   Collection Time: 02/04/16  2:21 PM  Result Value Ref Range   Prothrombin Time 14.1 11.6 - 15.2 seconds   INR 1.11 0.00 - 1.49  TSH     Status: None   Collection Time: 02/04/16  2:21 PM  Result Value Ref Range   TSH 2.698 0.350 - 4.500 uIU/mL  Heparin level (unfractionated)     Status: Abnormal   Collection Time: 02/04/16  9:11 PM  Result Value Ref Range   Heparin Unfractionated 0.27 (L) 0.30 - 0.70 IU/mL    Comment:        IF HEPARIN RESULTS ARE BELOW EXPECTED VALUES, AND PATIENT DOSAGE HAS BEEN CONFIRMED, SUGGEST FOLLOW UP TESTING OF ANTITHROMBIN III LEVELS.   Basic metabolic panel     Status: Abnormal   Collection Time: 02/05/16  5:36 AM  Result Value Ref Range   Sodium 139 135 - 145 mmol/L   Potassium 4.0 3.5 - 5.1 mmol/L   Chloride 105 101 - 111 mmol/L   CO2 25 22 - 32 mmol/L   Glucose, Bld 121 (H) 65 - 99 mg/dL    BUN 17 6 - 20 mg/dL   Creatinine, Ser 1.12 0.61 - 1.24 mg/dL   Calcium 8.6 (L) 8.9 - 10.3 mg/dL   GFR calc non Af Amer >60 >60 mL/min   GFR calc Af Amer >60 >60 mL/min    Comment: (NOTE) The eGFR has been calculated using the CKD EPI equation. This calculation has not been validated in all clinical situations. eGFR's persistently <60 mL/min signify possible Chronic Kidney Disease.    Anion gap 9 5 - 15  CBC     Status: Abnormal   Collection Time: 02/05/16  5:36 AM  Result Value Ref Range   WBC 8.1 4.0 - 10.5 K/uL   RBC 4.17 (L) 4.22 - 5.81 MIL/uL   Hemoglobin 13.3 13.0 - 17.0 g/dL   HCT 38.5 (L) 39.0 - 52.0 %   MCV 92.3 78.0 - 100.0 fL   MCH 31.9 26.0 - 34.0 pg   MCHC 34.5 30.0 - 36.0 g/dL   RDW 12.6 11.5 - 15.5 %   Platelets 160 150 - 400 K/uL  Heparin level (unfractionated)     Status: None   Collection Time: 02/05/16  5:36 AM  Result Value Ref Range   Heparin Unfractionated 0.37 0.30 - 0.70 IU/mL    Comment:        IF HEPARIN RESULTS ARE BELOW EXPECTED VALUES, AND PATIENT DOSAGE HAS BEEN CONFIRMED, SUGGEST FOLLOW UP TESTING OF ANTITHROMBIN III LEVELS.  Heparin level (unfractionated)     Status: None   Collection Time: 02/05/16 11:43 AM  Result Value Ref Range   Heparin Unfractionated 0.36 0.30 - 0.70 IU/mL    Comment:        IF HEPARIN RESULTS ARE BELOW EXPECTED VALUES, AND PATIENT DOSAGE HAS BEEN CONFIRMED, SUGGEST FOLLOW UP TESTING OF ANTITHROMBIN III LEVELS.   Echocardiogram     Status: None   Collection Time: 02/05/16  1:41 PM  Result Value Ref Range   Weight 5582.4 oz   Height 73 in   BP 137/73 mmHg  CBC     Status: None   Collection Time: 02/06/16  5:13 AM  Result Value Ref Range   WBC 8.9 4.0 - 10.5 K/uL   RBC 4.30 4.22 - 5.81 MIL/uL   Hemoglobin 13.6 13.0 - 17.0 g/dL   HCT 39.5 39.0 - 52.0 %   MCV 91.9 78.0 - 100.0 fL   MCH 31.6 26.0 - 34.0 pg   MCHC 34.4 30.0 - 36.0 g/dL   RDW 12.6 11.5 - 15.5 %   Platelets 172 150 - 400 K/uL  Heparin level  (unfractionated)     Status: None   Collection Time: 02/06/16  5:13 AM  Result Value Ref Range   Heparin Unfractionated 0.35 0.30 - 0.70 IU/mL    Comment:        IF HEPARIN RESULTS ARE BELOW EXPECTED VALUES, AND PATIENT DOSAGE HAS BEEN CONFIRMED, SUGGEST FOLLOW UP TESTING OF ANTITHROMBIN III LEVELS.    Assessment/Plan: Obstructive sleep apnea on CPAP Endorses compliance with CPAP therapy. BP within normal limits. Endorses resting well.  Lung nodule Noted in RML on CTA during hospitalization. Will repeat CT in 12 months to assess stability.  Hypothyroidism TSH normal in hospital despite no medication. Doubt this diagnosis. Will obtain old records for review and repeat TSH and T4 when patient returns for physical next month.  Essential hypertension BP well-controlled. Asymptomatic. Continue current regimen.  Bilateral edema of lower extremity Chronic, exacerbated by DVT of LLE. Will increase Lasix to 40 mg BID and begin potassium supplement. FU 2 weeks.  Acute pulmonary embolism (HCC) Denies current chest pain or SOB. Energy is returning. Is compliant with Eliquis. Will need to continue for at least 6 months. Referral placed to Hematology for assessment of hypercoagulable state. FU 2 weeks. Work restrictions given until [employer].  Acute deep vein thrombosis (DVT) of left lower extremity (HCC) Continue Eliquis and Lasix (at increased dose). Supportive measures reviewed. No sign of cellulitis of stasis dermatitis today. FU 2 weeks. Hematology referral placed giving number of DVT.

## 2016-02-14 NOTE — Progress Notes (Signed)
Pre visit review using our clinic review tool, if applicable. No additional management support is needed unless otherwise documented below in the visit note/SLS  

## 2016-02-14 NOTE — Patient Instructions (Addendum)
Please continue the Eliquis and Carvedilol as directed. Stay hydrated and eat a well-balanced diet. Limit salt intake.  Increase lasix to 1 tablet twice daily. Start the potassium supplement as directed. Get up andmove around when resting. We do not want you sitting for more than 1 hour at a time during the day.  You will be contacted by Hematology for further assessment of these multiple clots to make sure you do not have a clotting disorder.   Please use the Flonase daily as directed.  If you notice any chest pain or SOB, please call 911 or go to the ER.

## 2016-02-15 NOTE — Assessment & Plan Note (Signed)
BP well-controlled. Asymptomatic. Continue current regimen.

## 2016-02-15 NOTE — Assessment & Plan Note (Signed)
Denies current chest pain or SOB. Energy is returning. Is compliant with Eliquis. Will need to continue for at least 6 months. Referral placed to Hematology for assessment of hypercoagulable state. FU 2 weeks. Work restrictions given until [employer].

## 2016-02-15 NOTE — Assessment & Plan Note (Signed)
TSH normal in hospital despite no medication. Doubt this diagnosis. Will obtain old records for review and repeat TSH and T4 when patient returns for physical next month.

## 2016-02-15 NOTE — Assessment & Plan Note (Signed)
Continue Eliquis and Lasix (at increased dose). Supportive measures reviewed. No sign of cellulitis of stasis dermatitis today. FU 2 weeks. Hematology referral placed giving number of DVT.

## 2016-02-15 NOTE — Assessment & Plan Note (Signed)
Noted in RML on CTA during hospitalization. Will repeat CT in 12 months to assess stability.

## 2016-02-15 NOTE — Assessment & Plan Note (Signed)
Endorses compliance with CPAP therapy. BP within normal limits. Endorses resting well.

## 2016-02-15 NOTE — Assessment & Plan Note (Signed)
Chronic, exacerbated by DVT of LLE. Will increase Lasix to 40 mg BID and begin potassium supplement. FU 2 weeks.

## 2016-02-24 ENCOUNTER — Ambulatory Visit: Payer: BLUE CROSS/BLUE SHIELD

## 2016-02-24 ENCOUNTER — Encounter: Payer: Self-pay | Admitting: Hematology & Oncology

## 2016-02-24 ENCOUNTER — Ambulatory Visit (HOSPITAL_BASED_OUTPATIENT_CLINIC_OR_DEPARTMENT_OTHER): Payer: BLUE CROSS/BLUE SHIELD | Admitting: Hematology & Oncology

## 2016-02-24 ENCOUNTER — Other Ambulatory Visit (HOSPITAL_BASED_OUTPATIENT_CLINIC_OR_DEPARTMENT_OTHER): Payer: BLUE CROSS/BLUE SHIELD

## 2016-02-24 VITALS — BP 131/73 | HR 66 | Temp 98.0°F | Resp 16 | Ht 73.0 in | Wt 345.0 lb

## 2016-02-24 DIAGNOSIS — I2699 Other pulmonary embolism without acute cor pulmonale: Secondary | ICD-10-CM

## 2016-02-24 DIAGNOSIS — I82492 Acute embolism and thrombosis of other specified deep vein of left lower extremity: Secondary | ICD-10-CM

## 2016-02-24 DIAGNOSIS — I2692 Saddle embolus of pulmonary artery without acute cor pulmonale: Secondary | ICD-10-CM

## 2016-02-24 LAB — CMP (CANCER CENTER ONLY)
ALT: 34 U/L (ref 10–47)
AST: 26 U/L (ref 11–38)
Albumin: 4.3 g/dL (ref 3.3–5.5)
Alkaline Phosphatase: 80 U/L (ref 26–84)
BILIRUBIN TOTAL: 1 mg/dL (ref 0.20–1.60)
BUN: 18 mg/dL (ref 7–22)
CALCIUM: 9.4 mg/dL (ref 8.0–10.3)
CO2: 26 meq/L (ref 18–33)
Chloride: 102 mEq/L (ref 98–108)
Creat: 1.2 mg/dl (ref 0.6–1.2)
GLUCOSE: 109 mg/dL (ref 73–118)
Potassium: 3.8 mEq/L (ref 3.3–4.7)
SODIUM: 140 meq/L (ref 128–145)
Total Protein: 7.7 g/dL (ref 6.4–8.1)

## 2016-02-24 LAB — CBC WITH DIFFERENTIAL (CANCER CENTER ONLY)
BASO#: 0 10*3/uL (ref 0.0–0.2)
BASO%: 0.3 % (ref 0.0–2.0)
EOS%: 5.1 % (ref 0.0–7.0)
Eosinophils Absolute: 0.4 10*3/uL (ref 0.0–0.5)
HEMATOCRIT: 42.3 % (ref 38.7–49.9)
HGB: 15 g/dL (ref 13.0–17.1)
LYMPH#: 2.4 10*3/uL (ref 0.9–3.3)
LYMPH%: 33.5 % (ref 14.0–48.0)
MCH: 32.2 pg (ref 28.0–33.4)
MCHC: 35.5 g/dL (ref 32.0–35.9)
MCV: 91 fL (ref 82–98)
MONO#: 0.6 10*3/uL (ref 0.1–0.9)
MONO%: 8.2 % (ref 0.0–13.0)
NEUT#: 3.7 10*3/uL (ref 1.5–6.5)
NEUT%: 52.9 % (ref 40.0–80.0)
PLATELETS: 250 10*3/uL (ref 145–400)
RBC: 4.66 10*6/uL (ref 4.20–5.70)
RDW: 12.4 % (ref 11.1–15.7)
WBC: 7.1 10*3/uL (ref 4.0–10.0)

## 2016-02-24 NOTE — Progress Notes (Signed)
Referral MD  Reason for Referral: Pulmonary embolism and left leg thrombo-embolus   Chief Complaint  Patient presents with  . Other    New Patient  : Have a blood clot in my lung and my left leg.  HPI: [name] is a very nice 55 year old white male. He really has no past medical history. He really has not seen family doctor until recently. He does have hypertension. He does have sleep apnea.  He works at [employer]  he's been there for 36 years.  He began to have some leg swelling starting last year. He went to urgent care. They gave him some Lasix. He has a Doppler of his legs back in August and this was negative.  He continued have some pain and swelling in his legs. He finally went to the emergency room. He did have some chest discomfort. He had no cough. He had no increased shortness of breath. He had no obvious chest wall pain.  A Doppler of his left leg showed a fairly extensive thrombus. This was done on April 22. They thrombus from the left gastrocnemius vein up to the left femoral vein.  A CT angiogram was done. This showed a fairly large pulmonary embolism with some right heart strain. He had a right middle lobe nodule that was unclear in etiology. This measured 4 x 4 mm. Also noted was a rounded soft tissue density anterior to the sternomanubrial junction. This measured 3.4 x 2.2 cm.  He was started on heparin in the hospital. He now is on ELIQUIS.  There is no history of blood clots in the family.  He does not drink nor does he smoke.  He's had no prior surgeries.  He's had no recent distance travel.  He is not use testosterone.  Overall, his performance status is ECOG 0.     Past Medical History  Diagnosis Date  . Hypertension   . Thyroid disease     Unfounded  . Sleep apnea   . Pulmonary embolism ([facility])   . Deep vein thrombosis (DVT) of left lower extremity ([facility])   . History of chicken pox   . Measles   . GERD (gastroesophageal reflux disease)    :  Past Surgical History  Procedure Laterality Date  . Foot surgery      Chain saw accident  :   Current outpatient prescriptions:  .  apixaban (ELIQUIS) 5 MG TABS tablet, Take 1 tablet (5 mg total) by mouth 2 (two) times daily., Disp: 60 tablet, Rfl: 4 .  carvedilol (COREG) 12.5 MG tablet, Take 1 tablet (12.5 mg total) by mouth at bedtime., Disp: 30 tablet, Rfl: 3 .  fluticasone (FLONASE) 50 MCG/ACT nasal spray, Place 2 sprays into both nostrils daily., Disp: 16 g, Rfl: 6 .  furosemide (LASIX) 40 MG tablet, Take 1 tablet (40 mg total) by mouth 2 (two) times daily., Disp: 60 tablet, Rfl: 1 .  potassium chloride SA (K-DUR,KLOR-CON) 20 MEQ tablet, Take 1 tablet (20 mEq total) by mouth daily., Disp: 30 tablet, Rfl: 3:  :  No Known Allergies:  Family History  Problem Relation Age of Onset  . Hypertension Father     Living  . Heart disease Mother     Living  . Pulmonary embolism Mother   . Hypertension Other     Paternal Side  . Anemia Mother   . Hypertension Sister     x2  . Migraines Sister   . Allergies Son  x1  :  Social History   Social History  . Marital Status: Married    Spouse Name: N/A  . Number of Children: N/A  . Years of Education: N/A   Occupational History  . Not on file.   Social History Main Topics  . Smoking status: Never Smoker   . Smokeless tobacco: Not on file  . Alcohol Use: No  . Drug Use: No  . Sexual Activity: No   Other Topics Concern  . Not on file   Social History Narrative  :  Pertinent items are noted in HPI.  Exam: @ Obese white male in no obvious distress. Vital signs show a temperature of 98. Pulse 66. Blood pressure 131/73. Weight is 345 pounds. An exam shows no ocular or oral lesions. There are no palpable cervical or supraclavicular lymph nodes. Lungs are clear to percussion and ask rotation bilaterally. Cardiac exam regular rate and rhythm with no murmurs, rubs or bruits. Abdomen is soft. He is obese. He  has decent bowel sounds. There is no fluid wave. There is no palpable liver or spleen tip. Back exam shows no tenderness over the spine, ribs or hips. Extremities shows no clubbing, cyanosis or edema. He has a negative Homans sign in the left leg. His left leg is more full. No obvious venous cord is noted. Skin exam shows no rashes, ecchymoses or petechia. Neurological exam shows no focal neurological deficits.    Recent Labs  02/24/16 1311  WBC 7.1  HGB 15.0  HCT 42.3  PLT 250    Recent Labs  02/24/16 1312  NA 140  K 3.8  CL 102  CO2 26  GLUCOSE 109  BUN 18  CREATININE 1.2  CALCIUM 9.4    Blood smear review:  None  Pathology: None     Assessment and Plan:  [name] is a 55 year old white male. He has a pulmonary embolus. This was quite significant. Because some right heart strain. I suppose he was not felt to be a candidate for thrombolytic therapy.  He now is on ELIQUIS. I would keep on ELIQUIS for at least a year.  I would think that hypercoagulable studies probably will be negative. Given his age, and the fact that there is no family history, I would think that hypercoagulable studies would not be positive. However, we will still send them off.  I wonder if him having sleep apnea may be a risk factor.  He is quite large. He does not have diabetes. He does not smoke. I would think these of the more important risk factors.  I gave him a prescription for a measured compression stocking for his left leg. I think this is very important. He is at risk for post phlebitic syndrome.  I told him to wear the stocking when he is working as he is mostly on his feet.  I would repeat a CT angiogram of his chest and Doppler of his left leg in about 3 months.  I probably would also consider maintenance ELIQUIS for him which as been shown to be quite helpful. This dose would be 2.5 mg by mouth twice a day. This would start after 1 year of full anticoagulation.  I spent about 45  minutes with he and his wife. His wife back to the daughter of one of my other patients.  I answered all their questions. I make sure that he does not dehydrated that he drinks a lot of water.

## 2016-02-27 LAB — LUPUS ANTICOAGULANT PANEL
DRVVT CONFIRM: 1.2 ratio (ref 0.8–1.2)
DRVVT MIX: 55 s — AB (ref 0.0–47.0)
DRVVT: 81.5 s — AB (ref 0.0–47.0)
PTT-LA: 35 s (ref 0.0–43.6)

## 2016-02-27 LAB — PROTEIN C, TOTAL: Protein C Antigen: 92 % (ref 60–150)

## 2016-02-27 LAB — PROTEIN S ACTIVITY: Protein S-Functional: 155 % — ABNORMAL HIGH (ref 63–140)

## 2016-02-27 LAB — CARDIOLIPIN ANTIBODIES, IGG, IGM, IGA
Anticardiolipin Ab,IgG,Qn: <9 GPL U/mL (ref 0–14)
Anticardiolipin Ab,IgM,Qn: <9 MPL U/mL (ref 0–12)

## 2016-02-27 LAB — BETA-2-GLYCOPROTEIN I ABS, IGG/M/A: Beta-2 Glycoprotein I Ab, IgG: <9 GPI IgG units (ref 0–20)

## 2016-02-27 LAB — ANTITHROMBIN III: Antithrombin Activity: 147 % — ABNORMAL HIGH (ref 75–135)

## 2016-02-27 LAB — PROTEIN C ACTIVITY: PROTEIN C ACTIVITY: 145 % (ref 73–180)

## 2016-02-27 LAB — PROTEIN S, TOTAL: Protein S, Total: 114 % (ref 60–150)

## 2016-02-28 ENCOUNTER — Ambulatory Visit: Payer: BLUE CROSS/BLUE SHIELD | Admitting: Physician Assistant

## 2016-02-29 ENCOUNTER — Ambulatory Visit (INDEPENDENT_AMBULATORY_CARE_PROVIDER_SITE_OTHER): Payer: BLUE CROSS/BLUE SHIELD | Admitting: Physician Assistant

## 2016-02-29 ENCOUNTER — Encounter: Payer: Self-pay | Admitting: Physician Assistant

## 2016-02-29 VITALS — BP 136/90 | HR 68 | Temp 98.5°F | Resp 16 | Ht 73.0 in | Wt 347.4 lb

## 2016-02-29 DIAGNOSIS — I82402 Acute embolism and thrombosis of unspecified deep veins of left lower extremity: Secondary | ICD-10-CM | POA: Diagnosis not present

## 2016-02-29 DIAGNOSIS — I2692 Saddle embolus of pulmonary artery without acute cor pulmonale: Secondary | ICD-10-CM | POA: Diagnosis not present

## 2016-02-29 NOTE — Patient Instructions (Signed)
Please continue medications as directed. Stay hydrated and eat a well-balanced diet.  I am writing you to return to work full time. I will call once your FMLA paperwork is completed and has been faxed.

## 2016-02-29 NOTE — Progress Notes (Signed)
Pre visit review using our clinic review tool, if applicable. No additional management support is needed unless otherwise documented below in the visit note/SLS  

## 2016-02-29 NOTE — Progress Notes (Signed)
Patient presents to clinic today for follow-up of peripheral edema and multiple DVT of left lower extremity, along with acute PE. Patient is taking Eliquis as directed. Endorses pain of lower extremity has resolved. Denies chest pain or SOB. Is staying active around home and is doing well working only 4 hours per day at work. Is taking Lasix as directed without side effect. Has been seen by Hematology with hypercoagulability panel in process. Has started wearing a thigh high compression stocking of the left leg. Patient endorses he is feeling better than he has in a long time.   Past Medical History  Diagnosis Date  . Hypertension   . Thyroid disease     Unfounded  . Sleep apnea   . Pulmonary embolism ([place])   . Deep vein thrombosis (DVT) of left lower extremity ([place])   . History of chicken pox   . Measles   . GERD (gastroesophageal reflux disease)     Current Outpatient Prescriptions on File Prior to Visit  Medication Sig Dispense Refill  . apixaban (ELIQUIS) 5 MG TABS tablet Take 1 tablet (5 mg total) by mouth 2 (two) times daily. 60 tablet 4  . carvedilol (COREG) 12.5 MG tablet Take 1 tablet (12.5 mg total) by mouth at bedtime. 30 tablet 3  . fluticasone (FLONASE) 50 MCG/ACT nasal spray Place 2 sprays into both nostrils daily. 16 g 6  . furosemide (LASIX) 40 MG tablet Take 1 tablet (40 mg total) by mouth 2 (two) times daily. 60 tablet 1  . potassium chloride SA (K-DUR,KLOR-CON) 20 MEQ tablet Take 1 tablet (20 mEq total) by mouth daily. 30 tablet 3   No current facility-administered medications on file prior to visit.    No Known Allergies  Family History  Problem Relation Age of Onset  . Hypertension Father     Living  . Heart disease Mother     Living  . Pulmonary embolism Mother   . Hypertension Other     Paternal Side  . Anemia Mother   . Hypertension Sister     x2  . Migraines Sister   . Allergies Son     x1    Social History   Social History  . Marital  Status: Married    Spouse Name: N/A  . Number of Children: N/A  . Years of Education: N/A   Social History Main Topics  . Smoking status: Never Smoker   . Smokeless tobacco: Not on file  . Alcohol Use: No  . Drug Use: No  . Sexual Activity: No   Other Topics Concern  . Not on file   Social History Narrative    Review of Systems - See HPI.  All other ROS are negative.  Ht '6\' 1"'  (1.854 m)  Wt 347 lb 6 oz (157.568 kg)  BMI 45.84 kg/m2  Physical Exam  Constitutional: He is oriented to person, place, and time and well-developed, well-nourished, and in no distress.  HENT:  Head: Normocephalic and atraumatic.  Eyes: Conjunctivae are normal.  Neck: Neck supple.  Cardiovascular: Normal rate, regular rhythm, normal heart sounds and intact distal pulses.   Pulmonary/Chest: Effort normal and breath sounds normal. No respiratory distress. He has no wheezes. He has no rales. He exhibits no tenderness.  Neurological: He is alert and oriented to person, place, and time.  Skin: Skin is warm and dry. No rash noted.  Psychiatric: Affect normal.  Vitals reviewed.   Recent Results (from the past 2160 hour(s))  CBC  Status: None   Collection Time: 02/04/16  9:00 AM  Result Value Ref Range   WBC 10.3 4.0 - 10.5 K/uL   RBC 4.55 4.22 - 5.81 MIL/uL   Hemoglobin 14.2 13.0 - 17.0 g/dL   HCT 40.4 39.0 - 52.0 %   MCV 88.8 78.0 - 100.0 fL   MCH 31.2 26.0 - 34.0 pg   MCHC 35.1 30.0 - 36.0 g/dL   RDW 12.2 11.5 - 15.5 %   Platelets 180 150 - 400 K/uL  Comprehensive metabolic panel     Status: Abnormal   Collection Time: 02/04/16  9:00 AM  Result Value Ref Range   Sodium 137 135 - 145 mmol/L   Potassium 3.9 3.5 - 5.1 mmol/L   Chloride 106 101 - 111 mmol/L   CO2 23 22 - 32 mmol/L   Glucose, Bld 108 (H) 65 - 99 mg/dL   BUN 19 6 - 20 mg/dL   Creatinine, Ser 1.22 0.61 - 1.24 mg/dL   Calcium 9.0 8.9 - 10.3 mg/dL   Total Protein 7.1 6.5 - 8.1 g/dL   Albumin 3.9 3.5 - 5.0 g/dL   AST 18 15 -  41 U/L   ALT 24 17 - 63 U/L   Alkaline Phosphatase 83 38 - 126 U/L   Total Bilirubin 0.9 0.3 - 1.2 mg/dL   GFR calc non Af Amer >60 >60 mL/min   GFR calc Af Amer >60 >60 mL/min    Comment: (NOTE) The eGFR has been calculated using the CKD EPI equation. This calculation has not been validated in all clinical situations. eGFR's persistently <60 mL/min signify possible Chronic Kidney Disease.    Anion gap 8 5 - 15  D-dimer, quantitative (not at [facility])     Status: Abnormal   Collection Time: 02/04/16  9:00 AM  Result Value Ref Range   D-Dimer, Quant 16.51 (H) 0.00 - 0.50 ug/mL-FEU    Comment: (NOTE) At the manufacturer cut-off of 0.50 ug/mL FEU, this assay has been documented to exclude PE with a sensitivity and negative predictive value of 97 to 99%.  At this time, this assay has not been approved by the FDA to exclude DVT/VTE. Results should be correlated with clinical presentation.   Brain natriuretic peptide     Status: None   Collection Time: 02/04/16  9:00 AM  Result Value Ref Range   B Natriuretic Peptide 36.4 0.0 - 100.0 pg/mL  Troponin I     Status: None   Collection Time: 02/04/16  9:00 AM  Result Value Ref Range   Troponin I <0.03 <0.031 ng/mL    Comment:        NO INDICATION OF MYOCARDIAL INJURY.   APTT     Status: None   Collection Time: 02/04/16  2:21 PM  Result Value Ref Range   aPTT 28 24 - 37 seconds  Protime-INR     Status: None   Collection Time: 02/04/16  2:21 PM  Result Value Ref Range   Prothrombin Time 14.1 11.6 - 15.2 seconds   INR 1.11 0.00 - 1.49  TSH     Status: None   Collection Time: 02/04/16  2:21 PM  Result Value Ref Range   TSH 2.698 0.350 - 4.500 uIU/mL  Heparin level (unfractionated)     Status: Abnormal   Collection Time: 02/04/16  9:11 PM  Result Value Ref Range   Heparin Unfractionated 0.27 (L) 0.30 - 0.70 IU/mL    Comment:  IF HEPARIN RESULTS ARE BELOW EXPECTED VALUES, AND PATIENT DOSAGE HAS BEEN CONFIRMED, SUGGEST FOLLOW  UP TESTING OF ANTITHROMBIN III LEVELS.   Basic metabolic panel     Status: Abnormal   Collection Time: 02/05/16  5:36 AM  Result Value Ref Range   Sodium 139 135 - 145 mmol/L   Potassium 4.0 3.5 - 5.1 mmol/L   Chloride 105 101 - 111 mmol/L   CO2 25 22 - 32 mmol/L   Glucose, Bld 121 (H) 65 - 99 mg/dL   BUN 17 6 - 20 mg/dL   Creatinine, Ser 1.12 0.61 - 1.24 mg/dL   Calcium 8.6 (L) 8.9 - 10.3 mg/dL   GFR calc non Af Amer >60 >60 mL/min   GFR calc Af Amer >60 >60 mL/min    Comment: (NOTE) The eGFR has been calculated using the CKD EPI equation. This calculation has not been validated in all clinical situations. eGFR's persistently <60 mL/min signify possible Chronic Kidney Disease.    Anion gap 9 5 - 15  CBC     Status: Abnormal   Collection Time: 02/05/16  5:36 AM  Result Value Ref Range   WBC 8.1 4.0 - 10.5 K/uL   RBC 4.17 (L) 4.22 - 5.81 MIL/uL   Hemoglobin 13.3 13.0 - 17.0 g/dL   HCT 38.5 (L) 39.0 - 52.0 %   MCV 92.3 78.0 - 100.0 fL   MCH 31.9 26.0 - 34.0 pg   MCHC 34.5 30.0 - 36.0 g/dL   RDW 12.6 11.5 - 15.5 %   Platelets 160 150 - 400 K/uL  Heparin level (unfractionated)     Status: None   Collection Time: 02/05/16  5:36 AM  Result Value Ref Range   Heparin Unfractionated 0.37 0.30 - 0.70 IU/mL    Comment:        IF HEPARIN RESULTS ARE BELOW EXPECTED VALUES, AND PATIENT DOSAGE HAS BEEN CONFIRMED, SUGGEST FOLLOW UP TESTING OF ANTITHROMBIN III LEVELS.   Heparin level (unfractionated)     Status: None   Collection Time: 02/05/16 11:43 AM  Result Value Ref Range   Heparin Unfractionated 0.36 0.30 - 0.70 IU/mL    Comment:        IF HEPARIN RESULTS ARE BELOW EXPECTED VALUES, AND PATIENT DOSAGE HAS BEEN CONFIRMED, SUGGEST FOLLOW UP TESTING OF ANTITHROMBIN III LEVELS.   Echocardiogram     Status: None   Collection Time: 02/05/16  1:41 PM  Result Value Ref Range   Weight 5582.4 oz   Height 73 in   BP 137/73 mmHg  CBC     Status: None   Collection Time:  02/06/16  5:13 AM  Result Value Ref Range   WBC 8.9 4.0 - 10.5 K/uL   RBC 4.30 4.22 - 5.81 MIL/uL   Hemoglobin 13.6 13.0 - 17.0 g/dL   HCT 39.5 39.0 - 52.0 %   MCV 91.9 78.0 - 100.0 fL   MCH 31.6 26.0 - 34.0 pg   MCHC 34.4 30.0 - 36.0 g/dL   RDW 12.6 11.5 - 15.5 %   Platelets 172 150 - 400 K/uL  Heparin level (unfractionated)     Status: None   Collection Time: 02/06/16  5:13 AM  Result Value Ref Range   Heparin Unfractionated 0.35 0.30 - 0.70 IU/mL    Comment:        IF HEPARIN RESULTS ARE BELOW EXPECTED VALUES, AND PATIENT DOSAGE HAS BEEN CONFIRMED, SUGGEST FOLLOW UP TESTING OF ANTITHROMBIN III LEVELS.   CBC with Differential ([facility])     Status: None   Collection Time: 02/24/16  1:11 PM  Result Value Ref Range   WBC 7.1 4.0 - 10.0 10e3/uL   RBC 4.66 4.20 - 5.70 10e6/uL   HGB 15.0 13.0 - 17.1 g/dL   HCT 42.3 38.7 - 49.9 %   MCV 91 82 - 98 fL   MCH 32.2 28.0 - 33.4 pg   MCHC 35.5 32.0 - 35.9 g/dL   RDW 12.4 11.1 - 15.7 %   Platelets 250 145 - 400 10e3/uL   NEUT# 3.7 1.5 - 6.5 10e3/uL   LYMPH# 2.4 0.9 - 3.3 10e3/uL   MONO# 0.6 0.1 - 0.9 10e3/uL   Eosinophils Absolute 0.4 0.0 - 0.5 10e3/uL   BASO# 0.0 0.0 - 0.2 10e3/uL   NEUT% 52.9 40.0 - 80.0 %   LYMPH% 33.5 14.0 - 48.0 %   MONO% 8.2 0.0 - 13.0 %   EOS% 5.1 0.0 - 7.0 %   BASO% 0.3 0.0 - 2.0 %  Cardiolipin antibodies, IgG, IgM, IgA     Status: None   Collection Time: 02/24/16  1:12 PM  Result Value Ref Range   Anticardiolipin Ab,IgG,Qn <9 0 - 14 GPL U/mL    Comment:                                          Negative:              <15                                          Indeterminate:     15 - 20                                          Low-Med Positive: >20 - 80                                          High Positive:         >80    Anticardiolipin Ab,IgM,Qn <9 0 - 12 MPL U/mL    Comment:                                          Negative:              <13                                           Indeterminate:     13 - 20                                          Low-Med Positive: >20 - 80  High Positive:         >80    Anticardiolipin Ab,IgA,Qn <9 0 - 11 APL U/mL    Comment:                                          Negative:              <12                                          Indeterminate:     12 - 20                                          Low-Med Positive: >20 - 80                                          High Positive:         >80   Antithrombin III     Status: Abnormal   Collection Time: 02/24/16  1:12 PM  Result Value Ref Range   Antithrombin Activity 147 (H) 75 - 135 %    Comment: An elevated antithrombin activity is of no known clinical significance. Direct thrombin inhibitor anticoagulants such as rivaroxaban, apixaban and edoxaban will lead to spuriously elevated antithrombin activity levels possibly masking a deficiency.   Beta-2-glycoprotein i abs, IgG/M/A     Status: None   Collection Time: 02/24/16  1:12 PM  Result Value Ref Range   Beta-2 Glycoprotein I Ab, IgG <9 0 - 20 GPI IgG units    Comment: The reference interval reflects a 3SD or 99th percentile interval, which is thought to represent a potentially clinically significant result in accordance with the International Consensus Statement on the classification criteria for definitive antiphospholipid syndrome (APS). J Thromb Haem 2006;4:295-306.    Beta-2 Glyco 1 IgA <9 0 - 25 GPI IgA units    Comment: The reference interval reflects a 3SD or 99th percentile interval, which is thought to represent a potentially clinically significant result in accordance with the International Consensus Statement on the classification criteria for definitive antiphospholipid syndrome (APS). J Thromb Haem 2006;4:295-306.    Beta-2 Glyco 1 IgM <9 0 - 32 GPI IgM units    Comment: The reference interval reflects a 3SD or 99th percentile interval, which is thought to represent a  potentially clinically significant result in accordance with the International Consensus Statement on the classification criteria for definitive antiphospholipid syndrome (APS). J Thromb Haem 2006;4:295-306.   Lupus anticoagulant panel     Status: Abnormal   Collection Time: 02/24/16  1:12 PM  Result Value Ref Range   PTT-LA 35.0 0.0 - 43.6 sec    Comment:                **Effective April 09, 2016 the reference interval for**                  PTT-LA, will be changing to:       0.0 - 51.9    dRVVT 81.5 (H) 0.0 - 47.0 sec  Comment:                              **Please note reference interval change**   dRVVT Mix 55.0 (H) 0.0 - 47.0 sec    Comment:                              **Please note reference interval change**   dRVVT Confirm 1.2 0.8 - 1.2 ratio   Lupus Reflex Interpretation Comment:     Comment: No lupus anticoagulant was detected. These results are consistent with specific inhibitors to one or more common pathway factors (X, V, II or fibrinogen).   Protein C, total     Status: None   Collection Time: 02/24/16  1:12 PM  Result Value Ref Range   Protein C Antigen 92 60 - 150 %  Protein C activity     Status: None   Collection Time: 02/24/16  1:12 PM  Result Value Ref Range   Protein C-Functional 145 73 - 180 %  Protein S activity     Status: Abnormal   Collection Time: 02/24/16  1:12 PM  Result Value Ref Range   Protein S-Functional 155 (H) 63 - 140 %    Comment: An elevated protein S activity is of no known clinical significance. Protein S activity may be falsely increased (masking an abnormal, low result) in patients receiving direct Xa inhibitor (e.g., rivaroxaban, apixaban, edoxaban) or a direct thrombin inhibitor (e.g., dabigatran) anticoagulant treatment due to assay interference by these drugs.   Protein S, total     Status: None   Collection Time: 02/24/16  1:12 PM  Result Value Ref Range   Protein S, Total 114 60 - 150 %    Comment: This test was developed  and its performance characteristics determined by [facility]. It has not been cleared or approved by the Food and Drug Administration.   CMP STAT ([place] only)     Status: None   Collection Time: 02/24/16  1:12 PM  Result Value Ref Range   Sodium 140 128 - 145 mEq/L   Potassium 3.8 3.3 - 4.7 mEq/L   Chloride 102 98 - 108 mEq/L   CO2 26 18 - 33 mEq/L   Glucose, Bld 109 73 - 118 mg/dL   BUN, Bld 18 7 - 22 mg/dL   Creat 1.2 0.6 - 1.2 mg/dl   Total Bilirubin 1.00 0.20 - 1.60 mg/dl   Alkaline Phosphatase 80 26 - 84 U/L   AST 26 11 - 38 U/L   ALT(SGPT) 34 10 - 47 U/L   Total Protein 7.7 6.4 - 8.1 g/dL   Albumin 4.3 3.3 - 5.5 g/dL   Calcium 9.4 8.0 - 10.3 mg/dL    Assessment/Plan: 1. Acute deep vein thrombosis (DVT) of left lower extremity, unspecified vein (HCC) Doing very well with Lasix and Compression stockings. Continue as directed. Recent potassium within normal limits. Safe to continue current dose of Lasix. Patient to follow-up with Hematology as scheduled. FMLA paperwork filled out.  2. Acute saddle pulmonary embolism without acute cor pulmonale (HCC) No residual chest pain or SOB. O2 level looks great. Coagulation panel in process by Hematology. Continue Eliquis as directed. FU with Hematology as scheduled.

## 2016-03-01 LAB — FACTOR 5 LEIDEN

## 2016-03-02 ENCOUNTER — Telehealth: Payer: Self-pay | Admitting: *Deleted

## 2016-03-02 LAB — PROTHROMBIN GENE MUTATION

## 2016-03-02 NOTE — Telephone Encounter (Addendum)
Patient is aware of results  ----- Message from Josph MachoPeter R Ennever, MD sent at 03/01/2016  6:11 PM EDT ----- Call - so far all clotting studies are ok!!!  pete

## 2016-03-05 ENCOUNTER — Telehealth: Payer: Self-pay | Admitting: Physician Assistant

## 2016-03-05 NOTE — Telephone Encounter (Signed)
Called and spoke with patient concerning FMLA paperwork. FMLA is completed. [facility] Copy made to be kept in office. Original placed up front for patient pick up.

## 2016-04-06 ENCOUNTER — Other Ambulatory Visit: Payer: BLUE CROSS/BLUE SHIELD

## 2016-04-10 ENCOUNTER — Other Ambulatory Visit: Payer: Self-pay | Admitting: Physician Assistant

## 2016-04-10 NOTE — Telephone Encounter (Signed)
Rx request to pharmacy/SLS  

## 2016-04-11 ENCOUNTER — Other Ambulatory Visit (HOSPITAL_BASED_OUTPATIENT_CLINIC_OR_DEPARTMENT_OTHER): Payer: BLUE CROSS/BLUE SHIELD

## 2016-04-11 ENCOUNTER — Encounter: Payer: Self-pay | Admitting: Hematology & Oncology

## 2016-04-11 ENCOUNTER — Other Ambulatory Visit: Payer: Self-pay | Admitting: Physician Assistant

## 2016-04-11 ENCOUNTER — Ambulatory Visit (HOSPITAL_BASED_OUTPATIENT_CLINIC_OR_DEPARTMENT_OTHER): Payer: BLUE CROSS/BLUE SHIELD | Admitting: Hematology & Oncology

## 2016-04-11 VITALS — BP 140/71 | HR 60 | Temp 97.9°F | Resp 20 | Ht 73.0 in | Wt 351.1 lb

## 2016-04-11 DIAGNOSIS — I2699 Other pulmonary embolism without acute cor pulmonale: Secondary | ICD-10-CM

## 2016-04-11 DIAGNOSIS — I82402 Acute embolism and thrombosis of unspecified deep veins of left lower extremity: Secondary | ICD-10-CM

## 2016-04-11 DIAGNOSIS — I2692 Saddle embolus of pulmonary artery without acute cor pulmonale: Secondary | ICD-10-CM | POA: Diagnosis not present

## 2016-04-11 LAB — BASIC METABOLIC PANEL (CC13)
BUN / CREAT RATIO: 17 (ref 9–20)
BUN: 17 mg/dL (ref 6–24)
CREATININE: 0.99 mg/dL (ref 0.76–1.27)
Calcium, Ser: 9.5 mg/dL (ref 8.7–10.2)
Carbon Dioxide, Total: 26 mmol/L (ref 18–29)
Chloride, Ser: 103 mmol/L (ref 96–106)
GFR calc Af Amer: 99 mL/min/{1.73_m2} (ref >59)
GFR, EST NON AFRICAN AMERICAN: 86 mL/min/{1.73_m2} (ref >59)
Glucose: 91 mg/dL (ref 65–99)
Potassium, Ser: 3.6 mmol/L (ref 3.5–5.2)
SODIUM: 135 mmol/L (ref 134–144)

## 2016-04-11 LAB — CBC WITH DIFFERENTIAL (CANCER CENTER ONLY)
BASO#: 0 10*3/uL (ref 0.0–0.2)
BASO%: 0.4 % (ref 0.0–2.0)
EOS ABS: 0.5 10*3/uL (ref 0.0–0.5)
EOS%: 6.2 % (ref 0.0–7.0)
HEMATOCRIT: 42 % (ref 38.7–49.9)
HEMOGLOBIN: 15 g/dL (ref 13.0–17.1)
LYMPH#: 2.6 10*3/uL (ref 0.9–3.3)
LYMPH%: 34.2 % (ref 14.0–48.0)
MCH: 32.5 pg (ref 28.0–33.4)
MCHC: 35.7 g/dL (ref 32.0–35.9)
MCV: 91 fL (ref 82–98)
MONO#: 0.8 10*3/uL (ref 0.1–0.9)
MONO%: 10.5 % (ref 0.0–13.0)
NEUT%: 48.7 % (ref 40.0–80.0)
NEUTROS ABS: 3.8 10*3/uL (ref 1.5–6.5)
Platelets: 220 10*3/uL (ref 145–400)
RBC: 4.61 10*6/uL (ref 4.20–5.70)
RDW: 12.4 % (ref 11.1–15.7)
WBC: 7.7 10*3/uL (ref 4.0–10.0)

## 2016-04-11 NOTE — Progress Notes (Signed)
Hematology and Oncology Follow Up Visit  [name] [id number] 1961/01/15 55 y.o. 04/11/2016   Principle Diagnosis:   Idiopathic pulmonary embolism and left leg DVT  Current Therapy:    ELIQUIS 5 mg by mouth twice a day-to finish in April 2018     Interim History:  [name] is back for follow-up. We first saw him in May. At that point time, he had a pulmonary embolus and thrombus in the left leg. He was placed on ELIQUIS.  We will ahead and did a hypercoagulable workup on him. This was negative for any thrombophilic conditions.  He does have a compression stocking for his left leg. He says it is sometimes hard to wear this.  His left leg is not as swollen. He does get swollen on occasion.  He has had no bleeding or bruising. He's had no cough or shortness of breath. There's been no chest wall pain.  He is working full-time. He does not have any issues with working full-time.  Overall, his performance status is ECOG 0.  Medications:  Current outpatient prescriptions:  .  apixaban (ELIQUIS) 5 MG TABS tablet, Take 1 tablet (5 mg total) by mouth 2 (two) times daily., Disp: 60 tablet, Rfl: 4 .  carvedilol (COREG) 12.5 MG tablet, Take 1 tablet (12.5 mg total) by mouth at bedtime., Disp: 30 tablet, Rfl: 3 .  furosemide (LASIX) 40 MG tablet, TAKE 1 TABLET BY MOUTH TWICE DAILY, Disp: 60 tablet, Rfl: 1 .  potassium chloride SA (K-DUR,KLOR-CON) 20 MEQ tablet, Take 1 tablet (20 mEq total) by mouth daily., Disp: 30 tablet, Rfl: 3  Allergies: No Known Allergies  Past Medical History, Surgical history, Social history, and Family History were reviewed and updated.  Review of Systems: As above  Physical Exam:  height is 6\' 1"  (1.854 m) and weight is 351 lb 1.9 oz (159.267 kg). His oral temperature is 97.9 F (36.6 C). His blood pressure is 140/71 and his pulse is 60. His respiration is 20.   Wt Readings from Last 3 Encounters:  04/11/16 351 lb 1.9 oz (159.267 kg)  02/29/16  347 lb 6 oz (157.568 kg)  02/24/16 345 lb (156.491 kg)     Obese white male in no obvious distress. Head neck exam shows no ocular or oral lesions. There are no palpable cervical or supraclavicular lymph nodes. Lungs are clear. Cardiac exam regular rate and rhythm with no murmurs, rubs or bruits. Abdomen is soft. He is obese. Has good bowel sounds. There is no fluid wave. There is no palpable liver or spleen tip. Back exam shows no tenderness over the spine, ribs or hips. Extremities shows mild nonpitting edema of the left leg. No venous cord is noted. Has good pulses in his distal extremities. Skin exam shows no rashes, ecchymoses or petechia. Neurological exam shows no focal neurological deficits.  Lab Results  Component Value Date   WBC 7.7 04/11/2016   HGB 15.0 04/11/2016   HCT 42.0 04/11/2016   MCV 91 04/11/2016   PLT 220 04/11/2016     Chemistry      Component Value Date/Time   NA 140 02/24/2016 1312   NA 139 02/05/2016 0536   K 3.8 02/24/2016 1312   K 4.0 02/05/2016 0536   CL 102 02/24/2016 1312   CL 105 02/05/2016 0536   CO2 26 02/24/2016 1312   CO2 25 02/05/2016 0536   BUN 18 02/24/2016 1312   BUN 17 02/05/2016 0536   CREATININE 1.2 02/24/2016 1312  CREATININE 1.12 02/05/2016 0536      Component Value Date/Time   CALCIUM 9.4 02/24/2016 1312   CALCIUM 8.6* 02/05/2016 0536   ALKPHOS 80 02/24/2016 1312   ALKPHOS 83 02/04/2016 0900   AST 26 02/24/2016 1312   AST 18 02/04/2016 0900   ALT 34 02/24/2016 1312   ALT 24 02/04/2016 0900   BILITOT 1.00 02/24/2016 1312   BILITOT 0.9 02/04/2016 0900         Impression and Plan: [name] is a 55 year old white male. He has an idiopathic pulmonary embolus and left lower extremity thrombus. He has not been on anticoagulation for about 2 months.  I think that he is responding. We'll find out when we do our next set of studies we'll see him back.  I will like to do a CT angiogram and a left lower extremity Doppler. I  suspect that he probably will have residual thrombus in the leg. This would not surprise me.  I still feel that 1 year probably will be adequate.  I'm glad that his quality of life is doing well right now.  I spent about 30 minutes with him.  I will get him back in 6 weeks. We will do our scans the same day we see him.   [name],[name] Solana R, MD 6/28/20174:30 PM

## 2016-04-11 NOTE — Telephone Encounter (Signed)
Rx request to pharmacy/SLS  

## 2016-04-24 ENCOUNTER — Encounter: Payer: Self-pay | Admitting: Physician Assistant

## 2016-04-24 ENCOUNTER — Ambulatory Visit (INDEPENDENT_AMBULATORY_CARE_PROVIDER_SITE_OTHER): Payer: BLUE CROSS/BLUE SHIELD | Admitting: Physician Assistant

## 2016-04-24 VITALS — BP 138/88 | HR 62 | Temp 98.2°F | Resp 16 | Ht 73.0 in | Wt 352.1 lb

## 2016-04-24 DIAGNOSIS — Z0000 Encounter for general adult medical examination without abnormal findings: Secondary | ICD-10-CM | POA: Diagnosis not present

## 2016-04-24 DIAGNOSIS — I10 Essential (primary) hypertension: Secondary | ICD-10-CM | POA: Diagnosis not present

## 2016-04-24 DIAGNOSIS — E039 Hypothyroidism, unspecified: Secondary | ICD-10-CM

## 2016-04-24 DIAGNOSIS — Z1211 Encounter for screening for malignant neoplasm of colon: Secondary | ICD-10-CM

## 2016-04-24 DIAGNOSIS — Z125 Encounter for screening for malignant neoplasm of prostate: Secondary | ICD-10-CM | POA: Diagnosis not present

## 2016-04-24 NOTE — Patient Instructions (Signed)
Please go to the lab for blood work.   Our office will call you with your results unless you have chosen to receive results via MyChart.  If your blood work is normal we will follow-up each year for physicals and as scheduled for chronic medical problems.  If anything is abnormal we will treat accordingly and get you in for a follow-up.  Please take the Claritin daily with the Flonase to see if the ear opens up. If not, we will need to get you in with ENT.  Preventive Care for Adults, Male A healthy lifestyle and preventive care can promote health and wellness. Preventive health guidelines for men include the following key practices:  A routine yearly physical is a good way to check with your health care provider about your health and preventative screening. It is a chance to share any concerns and updates on your health and to receive a thorough exam.  Visit your dentist for a routine exam and preventative care every 6 months. Brush your teeth twice a day and floss once a day. Good oral hygiene prevents tooth decay and gum disease.  The frequency of eye exams is based on your age, health, family medical history, use of contact lenses, and other factors. Follow your health care provider's recommendations for frequency of eye exams.  Eat a healthy diet. Foods such as vegetables, fruits, whole grains, low-fat dairy products, and lean protein foods contain the nutrients you need without too many calories. Decrease your intake of foods high in solid fats, added sugars, and salt. Eat the right amount of calories for you.Get information about a proper diet from your health care provider, if necessary.  Regular physical exercise is one of the most important things you can do for your health. Most adults should get at least 150 minutes of moderate-intensity exercise (any activity that increases your heart rate and causes you to sweat) each week. In addition, most adults need muscle-strengthening  exercises on 2 or more days a week.  Maintain a healthy weight. The body mass index (BMI) is a screening tool to identify possible weight problems. It provides an estimate of body fat based on height and weight. Your health care provider can find your BMI and can help you achieve or maintain a healthy weight.For adults 20 years and older:  A BMI below 18.5 is considered underweight.  A BMI of 18.5 to 24.9 is normal.  A BMI of 25 to 29.9 is considered overweight.  A BMI of 30 and above is considered obese.  Maintain normal blood lipids and cholesterol levels by exercising and minimizing your intake of saturated fat. Eat a balanced diet with plenty of fruit and vegetables. Blood tests for lipids and cholesterol should begin at age 75 and be repeated every 5 years. If your lipid or cholesterol levels are high, you are over 50, or you are at high risk for heart disease, you may need your cholesterol levels checked more frequently.Ongoing high lipid and cholesterol levels should be treated with medicines if diet and exercise are not working.  If you smoke, find out from your health care provider how to quit. If you do not use tobacco, do not start.  Lung cancer screening is recommended for adults aged 37-80 years who are at high risk for developing lung cancer because of a history of smoking. A yearly low-dose CT scan of the lungs is recommended for people who have at least a 30-pack-year history of smoking and are a current  smoker or have quit within the past 15 years. A pack year of smoking is smoking an average of 1 pack of cigarettes a day for 1 year (for example: 1 pack a day for 30 years or 2 packs a day for 15 years). Yearly screening should continue until the smoker has stopped smoking for at least 15 years. Yearly screening should be stopped for people who develop a health problem that would prevent them from having lung cancer treatment.  If you choose to drink alcohol, do not have more than  2 drinks per day. One drink is considered to be 12 ounces (355 mL) of beer, 5 ounces (148 mL) of wine, or 1.5 ounces (44 mL) of liquor.  Avoid use of street drugs. Do not share needles with anyone. Ask for help if you need support or instructions about stopping the use of drugs.  High blood pressure causes heart disease and increases the risk of stroke. Your blood pressure should be checked at least every 1-2 years. Ongoing high blood pressure should be treated with medicines, if weight loss and exercise are not effective.  If you are 51-67 years old, ask your health care provider if you should take aspirin to prevent heart disease.  Diabetes screening is done by taking a blood sample to check your blood glucose level after you have not eaten for a certain period of time (fasting). If you are not overweight and you do not have risk factors for diabetes, you should be screened once every 3 years starting at age 34. If you are overweight or obese and you are 84-32 years of age, you should be screened for diabetes every year as part of your cardiovascular risk assessment.  Colorectal cancer can be detected and often prevented. Most routine colorectal cancer screening begins at the age of 26 and continues through age 21. However, your health care provider may recommend screening at an earlier age if you have risk factors for colon cancer. On a yearly basis, your health care provider may provide home test kits to check for hidden blood in the stool. Use of a small camera at the end of a tube to directly examine the colon (sigmoidoscopy or colonoscopy) can detect the earliest forms of colorectal cancer. Talk to your health care provider about this at age 80, when routine screening begins. Direct exam of the colon should be repeated every 5-10 years through age 59, unless early forms of precancerous polyps or small growths are found.  People who are at an increased risk for hepatitis B should be screened for  this virus. You are considered at high risk for hepatitis B if:  You were born in a country where hepatitis B occurs often. Talk with your health care provider about which countries are considered high risk.  Your parents were born in a high-risk country and you have not received a shot to protect against hepatitis B (hepatitis B vaccine).  You have HIV or AIDS.  You use needles to inject street drugs.  You live with, or have sex with, someone who has hepatitis B.  You are a man who has sex with other men (MSM).  You get hemodialysis treatment.  You take certain medicines for conditions such as cancer, organ transplantation, and autoimmune conditions.  Hepatitis C blood testing is recommended for all people born from [birth year] through 1965 and any individual with known risks for hepatitis C.  Practice safe sex. Use condoms and avoid high-risk sexual practices to reduce  the spread of sexually transmitted infections (STIs). STIs include gonorrhea, chlamydia, syphilis, trichomonas, herpes, HPV, and human immunodeficiency virus (HIV). Herpes, HIV, and HPV are viral illnesses that have no cure. They can result in disability, cancer, and death.  If you are a man who has sex with other men, you should be screened at least once per year for:  HIV.  Urethral, rectal, and pharyngeal infection of gonorrhea, chlamydia, or both.  If you are at risk of being infected with HIV, it is recommended that you take a prescription medicine daily to prevent HIV infection. This is called preexposure prophylaxis (PrEP). You are considered at risk if:  You are a man who has sex with other men (MSM) and have other risk factors.  You are a heterosexual man, are sexually active, and are at increased risk for HIV infection.  You take drugs by injection.  You are sexually active with a partner who has HIV.  Talk with your health care provider about whether you are at high risk of being infected with HIV. If you  choose to begin PrEP, you should first be tested for HIV. You should then be tested every 3 months for as long as you are taking PrEP.  A one-time screening for abdominal aortic aneurysm (AAA) and surgical repair of large AAAs by ultrasound are recommended for men ages 21 to 31 years who are current or former smokers.  Healthy men should no longer receive prostate-specific antigen (PSA) blood tests as part of routine cancer screening. Talk with your health care provider about prostate cancer screening.  Testicular cancer screening is not recommended for adult males who have no symptoms. Screening includes self-exam, a health care provider exam, and other screening tests. Consult with your health care provider about any symptoms you have or any concerns you have about testicular cancer.  Use sunscreen. Apply sunscreen liberally and repeatedly throughout the day. You should seek shade when your shadow is shorter than you. Protect yourself by wearing long sleeves, pants, a wide-brimmed hat, and sunglasses year round, whenever you are outdoors.  Once a month, do a whole-body skin exam, using a mirror to look at the skin on your back. Tell your health care provider about new moles, moles that have irregular borders, moles that are larger than a pencil eraser, or moles that have changed in shape or color.  Stay current with required vaccines (immunizations).  Influenza vaccine. All adults should be immunized every year.  Tetanus, diphtheria, and acellular pertussis (Td, Tdap) vaccine. An adult who has not previously received Tdap or who does not know his vaccine status should receive 1 dose of Tdap. This initial dose should be followed by tetanus and diphtheria toxoids (Td) booster doses every 10 years. Adults with an unknown or incomplete history of completing a 3-dose immunization series with Td-containing vaccines should begin or complete a primary immunization series including a Tdap dose. Adults  should receive a Td booster every 10 years.  Varicella vaccine. An adult without evidence of immunity to varicella should receive 2 doses or a second dose if he has previously received 1 dose.  Human papillomavirus (HPV) vaccine. Males aged 11-21 years who have not received the vaccine previously should receive the 3-dose series. Males aged 22-26 years may be immunized. Immunization is recommended through the age of 73 years for any male who has sex with males and did not get any or all doses earlier. Immunization is recommended for any person with an immunocompromised condition through  the age of 70 years if he did not get any or all doses earlier. During the 3-dose series, the second dose should be obtained 4-8 weeks after the first dose. The third dose should be obtained 24 weeks after the first dose and 16 weeks after the second dose.  Zoster vaccine. One dose is recommended for adults aged 18 years or older unless certain conditions are present.  Measles, mumps, and rubella (MMR) vaccine. Adults born before [birth year] generally are considered immune to measles and mumps. Adults born in [birth year] or later should have 1 or more doses of MMR vaccine unless there is a contraindication to the vaccine or there is laboratory evidence of immunity to each of the three diseases. A routine second dose of MMR vaccine should be obtained at least 28 days after the first dose for students attending postsecondary schools, health care workers, or international travelers. People who received inactivated measles vaccine or an unknown type of measles vaccine during 1963-1967 should receive 2 doses of MMR vaccine. People who received inactivated mumps vaccine or an unknown type of mumps vaccine before 1979 and are at high risk for mumps infection should consider immunization with 2 doses of MMR vaccine. Unvaccinated health care workers born before 40 who lack laboratory evidence of measles, mumps, or rubella immunity or laboratory  confirmation of disease should consider measles and mumps immunization with 2 doses of MMR vaccine or rubella immunization with 1 dose of MMR vaccine.  Pneumococcal 13-valent conjugate (PCV13) vaccine. When indicated, a person who is uncertain of his immunization history and has no record of immunization should receive the PCV13 vaccine. All adults 106 years of age and older should receive this vaccine. An adult aged 4 years or older who has certain medical conditions and has not been previously immunized should receive 1 dose of PCV13 vaccine. This PCV13 should be followed with a dose of pneumococcal polysaccharide (PPSV23) vaccine. Adults who are at high risk for pneumococcal disease should obtain the PPSV23 vaccine at least 8 weeks after the dose of PCV13 vaccine. Adults older than 55 years of age who have normal immune system function should obtain the PPSV23 vaccine dose at least 1 year after the dose of PCV13 vaccine.  Pneumococcal polysaccharide (PPSV23) vaccine. When PCV13 is also indicated, PCV13 should be obtained first. All adults aged 46 years and older should be immunized. An adult younger than age 100 years who has certain medical conditions should be immunized. Any person who resides in a nursing home or long-term care facility should be immunized. An adult smoker should be immunized. People with an immunocompromised condition and certain other conditions should receive both PCV13 and PPSV23 vaccines. People with human immunodeficiency virus (HIV) infection should be immunized as soon as possible after diagnosis. Immunization during chemotherapy or radiation therapy should be avoided. Routine use of PPSV23 vaccine is not recommended for American Indians, [place] Natives, or people younger than 65 years unless there are medical conditions that require PPSV23 vaccine. When indicated, people who have unknown immunization and have no record of immunization should receive PPSV23 vaccine. One-time  revaccination 5 years after the first dose of PPSV23 is recommended for people aged 19-64 years who have chronic kidney failure, nephrotic syndrome, asplenia, or immunocompromised conditions. People who received 1-2 doses of PPSV23 before age 84 years should receive another dose of PPSV23 vaccine at age 60 years or later if at least 5 years have passed since the previous dose. Doses of PPSV23 are not needed for  people immunized with PPSV23 at or after age 14 years.  Meningococcal vaccine. Adults with asplenia or persistent complement component deficiencies should receive 2 doses of quadrivalent meningococcal conjugate (MenACWY-D) vaccine. The doses should be obtained at least 2 months apart. Microbiologists working with certain meningococcal bacteria, [place] recruits, people at risk during an outbreak, and people who travel to or live in countries with a high rate of meningitis should be immunized. A first-year college student up through age 65 years who is living in a residence hall should receive a dose if he did not receive a dose on or after his 16th birthday. Adults who have certain high-risk conditions should receive one or more doses of vaccine.  Hepatitis A vaccine. Adults who wish to be protected from this disease, have chronic liver disease, work with hepatitis A-infected animals, work in hepatitis A research labs, or travel to or work in countries with a high rate of hepatitis A should be immunized. Adults who were previously unvaccinated and who anticipate close contact with an international adoptee during the first 60 days after arrival in the [place] from a country with a high rate of hepatitis A should be immunized.  Hepatitis B vaccine. Adults should be immunized if they wish to be protected from this disease, are under age 76 years and have diabetes, have chronic liver disease, have had more than one sex partner in the past 6 months, may be exposed to blood or other infectious body  fluids, are household contacts or sex partners of hepatitis B positive people, are clients or workers in certain care facilities, or travel to or work in countries with a high rate of hepatitis B.  Haemophilus influenzae type b (Hib) vaccine. A previously unvaccinated person with asplenia or sickle cell disease or having a scheduled splenectomy should receive 1 dose of Hib vaccine. Regardless of previous immunization, a recipient of a hematopoietic stem cell transplant should receive a 3-dose series 6-12 months after his successful transplant. Hib vaccine is not recommended for adults with HIV infection. Preventive Service / Frequency Ages 69 to 32  Blood pressure check.** / Every 3-5 years.  Lipid and cholesterol check.** / Every 5 years beginning at age 67.  Hepatitis C blood test.** / For any individual with known risks for hepatitis C.  Skin self-exam. / Monthly.  Influenza vaccine. / Every year.  Tetanus, diphtheria, and acellular pertussis (Tdap, Td) vaccine.** / Consult your health care provider. 1 dose of Td every 10 years.  Varicella vaccine.** / Consult your health care provider.  HPV vaccine. / 3 doses over 6 months, if 25 or younger.  Measles, mumps, rubella (MMR) vaccine.** / You need at least 1 dose of MMR if you were born in 1957 or later. You may also need a second dose.  Pneumococcal 13-valent conjugate (PCV13) vaccine.** / Consult your health care provider.  Pneumococcal polysaccharide (PPSV23) vaccine.** / 1 to 2 doses if you smoke cigarettes or if you have certain conditions.  Meningococcal vaccine.** / 1 dose if you are age 27 to 16 years and a [employer] researcher living in a residence hall, or have one of several medical conditions. You may also need additional booster doses.  Hepatitis A vaccine.** / Consult your health care provider.  Hepatitis B vaccine.** / Consult your health care provider.  Haemophilus influenzae type b (Hib) vaccine.** / Consult  your health care provider. Ages 70 to 32  Blood pressure check.** / Every year.  Lipid and cholesterol check.** /  Every 5 years beginning at age 9.  Lung cancer screening. / Every year if you are aged 73-80 years and have a 30-pack-year history of smoking and currently smoke or have quit within the past 15 years. Yearly screening is stopped once you have quit smoking for at least 15 years or develop a health problem that would prevent you from having lung cancer treatment.  Fecal occult blood test (FOBT) of stool. / Every year beginning at age 54 and continuing until age 44. You may not have to do this test if you get a colonoscopy every 10 years.  Flexible sigmoidoscopy** or colonoscopy.** / Every 5 years for a flexible sigmoidoscopy or every 10 years for a colonoscopy beginning at age 41 and continuing until age 26.  Hepatitis C blood test.** / For all people born from [birth year] through 1965 and any individual with known risks for hepatitis C.  Skin self-exam. / Monthly.  Influenza vaccine. / Every year.  Tetanus, diphtheria, and acellular pertussis (Tdap/Td) vaccine.** / Consult your health care provider. 1 dose of Td every 10 years.  Varicella vaccine.** / Consult your health care provider.  Zoster vaccine.** / 1 dose for adults aged 74 years or older.  Measles, mumps, rubella (MMR) vaccine.** / You need at least 1 dose of MMR if you were born in 1957 or later. You may also need a second dose.  Pneumococcal 13-valent conjugate (PCV13) vaccine.** / Consult your health care provider.  Pneumococcal polysaccharide (PPSV23) vaccine.** / 1 to 2 doses if you smoke cigarettes or if you have certain conditions.  Meningococcal vaccine.** / Consult your health care provider.  Hepatitis A vaccine.** / Consult your health care provider.  Hepatitis B vaccine.** / Consult your health care provider.  Haemophilus influenzae type b (Hib) vaccine.** / Consult your health care provider. Ages 50 and  over  Blood pressure check.** / Every year.  Lipid and cholesterol check.**/ Every 5 years beginning at age 32.  Lung cancer screening. / Every year if you are aged 29-80 years and have a 30-pack-year history of smoking and currently smoke or have quit within the past 15 years. Yearly screening is stopped once you have quit smoking for at least 15 years or develop a health problem that would prevent you from having lung cancer treatment.  Fecal occult blood test (FOBT) of stool. / Every year beginning at age 64 and continuing until age 30. You may not have to do this test if you get a colonoscopy every 10 years.  Flexible sigmoidoscopy** or colonoscopy.** / Every 5 years for a flexible sigmoidoscopy or every 10 years for a colonoscopy beginning at age 30 and continuing until age 77.  Hepatitis C blood test.** / For all people born from [birth year] through 1965 and any individual with known risks for hepatitis C.  Abdominal aortic aneurysm (AAA) screening.** / A one-time screening for ages 65 to 63 years who are current or former smokers.  Skin self-exam. / Monthly.  Influenza vaccine. / Every year.  Tetanus, diphtheria, and acellular pertussis (Tdap/Td) vaccine.** / 1 dose of Td every 10 years.  Varicella vaccine.** / Consult your health care provider.  Zoster vaccine.** / 1 dose for adults aged 92 years or older.  Pneumococcal 13-valent conjugate (PCV13) vaccine.** / 1 dose for all adults aged 41 years and older.  Pneumococcal polysaccharide (PPSV23) vaccine.** / 1 dose for all adults aged 32 years and older.  Meningococcal vaccine.** / Consult your health care provider.  Hepatitis A vaccine.** /  Consult your health care provider.  Hepatitis B vaccine.** / Consult your health care provider.  Haemophilus influenzae type b (Hib) vaccine.** / Consult your health care provider. **Family history and personal history of risk and conditions may change your health care provider's  recommendations.   This information is not intended to replace advice given to you by your health care provider. Make sure you discuss any questions you have with your health care provider.   Document Released: 11/27/2001 Document Revised: 10/22/2014 Document Reviewed: 02/26/2011 Elsevier Interactive Patient Education [employer].

## 2016-04-24 NOTE — Progress Notes (Signed)
Patient presents to clinic today for annual exam.  Patient is fasting for labs.  Acute Concerns: Patient denies acute concerns at today's visit.   Chronic Issues: Hypertension -- Endorses taking medications as directed. Patient denies chest pain, palpitations, lightheadedness, dizziness, vision changes or frequent headaches.  BP Readings from Last 3 Encounters:  04/24/16 138/88  04/11/16 140/71  02/29/16 136/90   Hypothyroidism -- Is taking levothyroxine as directed. Is due for repeat TSH levels.  Health Maintenance: Immunizations -- due for TDaP  Colonoscopy -- Due for screening colonoscopy. Average risk. Asymptomatic.  Past Medical History  Diagnosis Date  . Hypertension   . Thyroid disease     Unfounded  . Sleep apnea   . Pulmonary embolism ([place])   . Deep vein thrombosis (DVT) of left lower extremity ([place])   . History of chicken pox   . Measles   . GERD (gastroesophageal reflux disease)     Past Surgical History  Procedure Laterality Date  . Foot surgery      Chain saw accident    Current Outpatient Prescriptions on File Prior to Visit  Medication Sig Dispense Refill  . apixaban (ELIQUIS) 5 MG TABS tablet Take 1 tablet (5 mg total) by mouth 2 (two) times daily. 60 tablet 4  . carvedilol (COREG) 12.5 MG tablet Take 1 tablet (12.5 mg total) by mouth at bedtime. 30 tablet 3  . furosemide (LASIX) 40 MG tablet TAKE 1 TABLET BY MOUTH TWICE DAILY 60 tablet 1  . potassium chloride SA (K-DUR,KLOR-CON) 20 MEQ tablet Take 1 tablet (20 mEq total) by mouth daily. 30 tablet 3   No current facility-administered medications on file prior to visit.    No Known Allergies  Family History  Problem Relation Age of Onset  . Hypertension Father     Living  . Heart disease Mother     Living  . Pulmonary embolism Mother   . Hypertension Other     Paternal Side  . Anemia Mother   . Hypertension Sister     x2  . Migraines Sister   . Allergies Son     x1    Social  History   Social History  . Marital Status: Married    Spouse Name: N/A  . Number of Children: N/A  . Years of Education: N/A   Occupational History  . Not on file.   Social History Main Topics  . Smoking status: Never Smoker   . Smokeless tobacco: Not on file  . Alcohol Use: No  . Drug Use: No  . Sexual Activity: No   Other Topics Concern  . Not on file   Social History Narrative    Review of Systems  Constitutional: Negative for fever and weight loss.  HENT: Negative for ear discharge, ear pain, hearing loss and tinnitus.   Eyes: Negative for blurred vision, double vision, photophobia and pain.  Respiratory: Negative for cough and shortness of breath.   Cardiovascular: Negative for chest pain and palpitations.  Gastrointestinal: Negative for heartburn, nausea, vomiting, abdominal pain, diarrhea, constipation, blood in stool and melena.  Genitourinary: Negative for dysuria, urgency, frequency, hematuria and flank pain.  Musculoskeletal: Negative for falls.  Neurological: Negative for dizziness, loss of consciousness and headaches.  Endo/Heme/Allergies: Negative for environmental allergies.  Psychiatric/Behavioral: Negative for depression, suicidal ideas, hallucinations and substance abuse. The patient is not nervous/anxious and does not have insomnia.     BP 138/88 mmHg  Pulse 62  Temp(Src) 98.2 F (36.8 C) (  Oral)  Resp 16  Ht '6\' 1"'  (1.854 m)  Wt 352 lb 2 oz (159.723 kg)  BMI 46.47 kg/m2  SpO2 98%  Physical Exam  Constitutional: He is oriented to person, place, and time and well-developed, well-nourished, and in no distress.  HENT:  Head: Normocephalic and atraumatic.  Right Ear: External ear normal.  Left Ear: External ear normal.  Nose: Nose normal.  Mouth/Throat: Oropharynx is clear and moist. No oropharyngeal exudate.  Eyes: Conjunctivae and EOM are normal. Pupils are equal, round, and reactive to light.  Neck: Neck supple. No thyromegaly present.    Cardiovascular: Normal rate, regular rhythm, normal heart sounds and intact distal pulses.   Pulmonary/Chest: Effort normal and breath sounds normal. No respiratory distress. He has no wheezes. He has no rales. He exhibits no tenderness.  Abdominal: Soft. Bowel sounds are normal. He exhibits no distension and no mass. There is no tenderness. There is no rebound and no guarding.  Genitourinary: Testes/scrotum normal.  Lymphadenopathy:    He has no cervical adenopathy.  Neurological: He is alert and oriented to person, place, and time.  Skin: Skin is warm and dry. No rash noted.  Psychiatric: Affect normal.  Vitals reviewed.   Recent Results (from the past 2160 hour(s))  CBC     Status: None   Collection Time: 02/04/16  9:00 AM  Result Value Ref Range   WBC 10.3 4.0 - 10.5 K/uL   RBC 4.55 4.22 - 5.81 MIL/uL   Hemoglobin 14.2 13.0 - 17.0 g/dL   HCT 40.4 39.0 - 52.0 %   MCV 88.8 78.0 - 100.0 fL   MCH 31.2 26.0 - 34.0 pg   MCHC 35.1 30.0 - 36.0 g/dL   RDW 12.2 11.5 - 15.5 %   Platelets 180 150 - 400 K/uL  Comprehensive metabolic panel     Status: Abnormal   Collection Time: 02/04/16  9:00 AM  Result Value Ref Range   Sodium 137 135 - 145 mmol/L   Potassium 3.9 3.5 - 5.1 mmol/L   Chloride 106 101 - 111 mmol/L   CO2 23 22 - 32 mmol/L   Glucose, Bld 108 (H) 65 - 99 mg/dL   BUN 19 6 - 20 mg/dL   Creatinine, Ser 1.22 0.61 - 1.24 mg/dL   Calcium 9.0 8.9 - 10.3 mg/dL   Total Protein 7.1 6.5 - 8.1 g/dL   Albumin 3.9 3.5 - 5.0 g/dL   AST 18 15 - 41 U/L   ALT 24 17 - 63 U/L   Alkaline Phosphatase 83 38 - 126 U/L   Total Bilirubin 0.9 0.3 - 1.2 mg/dL   GFR calc non Af Amer >60 >60 mL/min   GFR calc Af Amer >60 >60 mL/min    Comment: (NOTE) The eGFR has been calculated using the CKD EPI equation. This calculation has not been validated in all clinical situations. eGFR's persistently <60 mL/min signify possible Chronic Kidney Disease.    Anion gap 8 5 - 15  D-dimer, quantitative  (not at [facility])     Status: Abnormal   Collection Time: 02/04/16  9:00 AM  Result Value Ref Range   D-Dimer, Quant 16.51 (H) 0.00 - 0.50 ug/mL-FEU    Comment: (NOTE) At the manufacturer cut-off of 0.50 ug/mL FEU, this assay has been documented to exclude PE with a sensitivity and negative predictive value of 97 to 99%.  At this time, this assay has not been approved by the FDA to exclude DVT/VTE. Results should be correlated  with clinical presentation.   Brain natriuretic peptide     Status: None   Collection Time: 02/04/16  9:00 AM  Result Value Ref Range   B Natriuretic Peptide 36.4 0.0 - 100.0 pg/mL  Troponin I     Status: None   Collection Time: 02/04/16  9:00 AM  Result Value Ref Range   Troponin I <0.03 <0.031 ng/mL    Comment:        NO INDICATION OF MYOCARDIAL INJURY.   APTT     Status: None   Collection Time: 02/04/16  2:21 PM  Result Value Ref Range   aPTT 28 24 - 37 seconds  Protime-INR     Status: None   Collection Time: 02/04/16  2:21 PM  Result Value Ref Range   Prothrombin Time 14.1 11.6 - 15.2 seconds   INR 1.11 0.00 - 1.49  TSH     Status: None   Collection Time: 02/04/16  2:21 PM  Result Value Ref Range   TSH 2.698 0.350 - 4.500 uIU/mL  Heparin level (unfractionated)     Status: Abnormal   Collection Time: 02/04/16  9:11 PM  Result Value Ref Range   Heparin Unfractionated 0.27 (L) 0.30 - 0.70 IU/mL    Comment:        IF HEPARIN RESULTS ARE BELOW EXPECTED VALUES, AND PATIENT DOSAGE HAS BEEN CONFIRMED, SUGGEST FOLLOW UP TESTING OF ANTITHROMBIN III LEVELS.   Basic metabolic panel     Status: Abnormal   Collection Time: 02/05/16  5:36 AM  Result Value Ref Range   Sodium 139 135 - 145 mmol/L   Potassium 4.0 3.5 - 5.1 mmol/L   Chloride 105 101 - 111 mmol/L   CO2 25 22 - 32 mmol/L   Glucose, Bld 121 (H) 65 - 99 mg/dL   BUN 17 6 - 20 mg/dL   Creatinine, Ser 1.12 0.61 - 1.24 mg/dL   Calcium 8.6 (L) 8.9 - 10.3 mg/dL   GFR calc non Af Amer >60 >60 mL/min    GFR calc Af Amer >60 >60 mL/min    Comment: (NOTE) The eGFR has been calculated using the CKD EPI equation. This calculation has not been validated in all clinical situations. eGFR's persistently <60 mL/min signify possible Chronic Kidney Disease.    Anion gap 9 5 - 15  CBC     Status: Abnormal   Collection Time: 02/05/16  5:36 AM  Result Value Ref Range   WBC 8.1 4.0 - 10.5 K/uL   RBC 4.17 (L) 4.22 - 5.81 MIL/uL   Hemoglobin 13.3 13.0 - 17.0 g/dL   HCT 38.5 (L) 39.0 - 52.0 %   MCV 92.3 78.0 - 100.0 fL   MCH 31.9 26.0 - 34.0 pg   MCHC 34.5 30.0 - 36.0 g/dL   RDW 12.6 11.5 - 15.5 %   Platelets 160 150 - 400 K/uL  Heparin level (unfractionated)     Status: None   Collection Time: 02/05/16  5:36 AM  Result Value Ref Range   Heparin Unfractionated 0.37 0.30 - 0.70 IU/mL    Comment:        IF HEPARIN RESULTS ARE BELOW EXPECTED VALUES, AND PATIENT DOSAGE HAS BEEN CONFIRMED, SUGGEST FOLLOW UP TESTING OF ANTITHROMBIN III LEVELS.   Heparin level (unfractionated)     Status: None   Collection Time: 02/05/16 11:43 AM  Result Value Ref Range   Heparin Unfractionated 0.36 0.30 - 0.70 IU/mL    Comment:  IF HEPARIN RESULTS ARE BELOW EXPECTED VALUES, AND PATIENT DOSAGE HAS BEEN CONFIRMED, SUGGEST FOLLOW UP TESTING OF ANTITHROMBIN III LEVELS.   Echocardiogram     Status: None   Collection Time: 02/05/16  1:41 PM  Result Value Ref Range   Weight 5582.4 oz   Height 73 in   BP 137/73 mmHg  CBC     Status: None   Collection Time: 02/06/16  5:13 AM  Result Value Ref Range   WBC 8.9 4.0 - 10.5 K/uL   RBC 4.30 4.22 - 5.81 MIL/uL   Hemoglobin 13.6 13.0 - 17.0 g/dL   HCT 39.5 39.0 - 52.0 %   MCV 91.9 78.0 - 100.0 fL   MCH 31.6 26.0 - 34.0 pg   MCHC 34.4 30.0 - 36.0 g/dL   RDW 12.6 11.5 - 15.5 %   Platelets 172 150 - 400 K/uL  Heparin level (unfractionated)     Status: None   Collection Time: 02/06/16  5:13 AM  Result Value Ref Range   Heparin Unfractionated 0.35 0.30 -  0.70 IU/mL    Comment:        IF HEPARIN RESULTS ARE BELOW EXPECTED VALUES, AND PATIENT DOSAGE HAS BEEN CONFIRMED, SUGGEST FOLLOW UP TESTING OF ANTITHROMBIN III LEVELS.   CBC with Differential [facility])     Status: None   Collection Time: 02/24/16  1:11 PM  Result Value Ref Range   WBC 7.1 4.0 - 10.0 10e3/uL   RBC 4.66 4.20 - 5.70 10e6/uL   HGB 15.0 13.0 - 17.1 g/dL   HCT 42.3 38.7 - 49.9 %   MCV 91 82 - 98 fL   MCH 32.2 28.0 - 33.4 pg   MCHC 35.5 32.0 - 35.9 g/dL   RDW 12.4 11.1 - 15.7 %   Platelets 250 145 - 400 10e3/uL   NEUT# 3.7 1.5 - 6.5 10e3/uL   LYMPH# 2.4 0.9 - 3.3 10e3/uL   MONO# 0.6 0.1 - 0.9 10e3/uL   Eosinophils Absolute 0.4 0.0 - 0.5 10e3/uL   BASO# 0.0 0.0 - 0.2 10e3/uL   NEUT% 52.9 40.0 - 80.0 %   LYMPH% 33.5 14.0 - 48.0 %   MONO% 8.2 0.0 - 13.0 %   EOS% 5.1 0.0 - 7.0 %   BASO% 0.3 0.0 - 2.0 %  Cardiolipin antibodies, IgG, IgM, IgA     Status: None   Collection Time: 02/24/16  1:12 PM  Result Value Ref Range   Anticardiolipin Ab,IgG,Qn <9 0 - 14 GPL U/mL    Comment:                                          Negative:              <15                                          Indeterminate:     15 - 20                                          Low-Med Positive: >20 - 80  High Positive:         >80    Anticardiolipin Ab,IgM,Qn <9 0 - 12 MPL U/mL    Comment:                                          Negative:              <13                                          Indeterminate:     13 - 20                                          Low-Med Positive: >20 - 80                                          High Positive:         >80    Anticardiolipin Ab,IgA,Qn <9 0 - 11 APL U/mL    Comment:                                          Negative:              <12                                          Indeterminate:     12 - 20                                          Low-Med Positive: >20 - 80                                           High Positive:         >80   Antithrombin III     Status: Abnormal   Collection Time: 02/24/16  1:12 PM  Result Value Ref Range   Antithrombin Activity 147 (H) 75 - 135 %    Comment: An elevated antithrombin activity is of no known clinical significance. Direct thrombin inhibitor anticoagulants such as rivaroxaban, apixaban and edoxaban will lead to spuriously elevated antithrombin activity levels possibly masking a deficiency.   Beta-2-glycoprotein i abs, IgG/M/A     Status: None   Collection Time: 02/24/16  1:12 PM  Result Value Ref Range   Beta-2 Glycoprotein I Ab, IgG <9 0 - 20 GPI IgG units    Comment: The reference interval reflects a 3SD or 99th percentile interval, which is thought to represent a potentially clinically significant result in accordance with the International Consensus Statement on the classification criteria for definitive antiphospholipid syndrome (APS). J Thromb Haem 2006;4:295-306.  Beta-2 Glyco 1 IgA <9 0 - 25 GPI IgA units    Comment: The reference interval reflects a 3SD or 99th percentile interval, which is thought to represent a potentially clinically significant result in accordance with the International Consensus Statement on the classification criteria for definitive antiphospholipid syndrome (APS). J Thromb Haem 2006;4:295-306.    Beta-2 Glyco 1 IgM <9 0 - 32 GPI IgM units    Comment: The reference interval reflects a 3SD or 99th percentile interval, which is thought to represent a potentially clinically significant result in accordance with the International Consensus Statement on the classification criteria for definitive antiphospholipid syndrome (APS). J Thromb Haem 2006;4:295-306.   Prothrombin gene mutation     Status: None   Collection Time: 02/24/16  1:12 PM  Result Value Ref Range   Factor II, DNA Analysis Comment     Comment: NEGATIVE No mutation identified. Comment: A point mutation ([id number]) in the factor II  (prothrombin) gene is the second most common cause of inherited thrombophilia. The incidence of this mutation in the [place] Caucasian population is about 2% and in the [place] American population it is approximately 0.5%. This mutation is rare in the [place] and Native American population. Being heterozygous for a prothrombin mutation increases the risk for developing venous thrombosis about 2 to 3 times above the general population risk. Being homozygous for the prothrombin gene mutation increases the relative risk for venous thrombosis further, although it is not yet known how much further the risk is increased. In women heterozygous for the prothrombin gene mutation, the use of estrogen containing oral contraceptives increases the relative risk of venous thrombosis about 16 times and the risk of developing cerebral thrombosis is also significantly increased. In pregnancy the prothrombin  gene mutation increases risk for venous thrombosis and may increase risk for stillbirth, placental abruption, pre-eclampsia and fetal growth restriction. If the patient possesses two or more congenital or acquired thrombophilic risk factors, the risk for thrombosis may rise to more than the sum of the risk ratios for the individual mutations. This assay detects only the prothrombin G20210A mutation and does not measure genetic abnormalities elsewhere in the genome. Other thrombotic risk factors may be pursued through systematic clinical laboratory analysis. These factors include the R506Q (Leiden) mutation in the Factor V gene, plasma homocysteine levels, as well as testing for deficiencies of antithrombin III, protein C and protein S.    Additional Information Comment     Comment: Genetic Counselors are available for health care providers to discuss results at [phone]). Methodology: DNA analysis of the Factor II gene was performed by PCR amplification followed by restriction analysis.  The diagnostic sensitivity is >99% for both. All the tests must be combined with clinical information for the most accurate interpretation. Molecular-based testing is highly accurate, but as in any laboratory test, diagnostic errors may occur. This test was developed and its performance characteristics determined by [facility]. It has not been cleared or approved by the Food and Drug Administration. Poort SR, et al. Blood. 1996; 74:0814-4818. Varga EA. Circulation. 2004; 563:J49-F02. Mervin Hack, et [place]; 19:700-703. Allison Quarry, PhD, [facility] Ruben Reason, PhD, [facility] Jens Som, PhD, [facility] Annetta Maw, M.S., PhD, [facility] Alfredo Bach, PhD, [facility] Norva Riffle, PhD, [facility] Earlean Polka, PhD, [facility]   Factor 5 leiden     Status: None   Collection Time: 02/24/16  1:12 PM  Result Value Ref Range   Factor V Leiden Comment  Comment: Result:  Negative (no mutation found) Factor V Leiden is a specific mutation (R506Q) in the factor V gene that is associated with an increased risk of venous thrombosis. Factor V Leiden is more resistant to inactivation by activated protein C.  As a result, factor V persists in the circulation leading to a mild hyper- coagulable state.  The Leiden mutation accounts for 90% - 95% of APC resistance.  Factor V Leiden has been reported in patients with deep vein thrombosis, pulmonary embolus, central retinal vein occlusion, cerebral sinus thrombosis and hepatic vein thrombosis. Other risk factors to be considered in the workup for venous thrombosis include the G20210A mutation in the factor II (prothrombin) gene, protein S and C deficiency, and antithrombin deficiencies. Anticardiolipin antibody and lupus anticoagulant analysis may be appropriate for certain patients, as well as homocysteine levels. Contact your local [facility] for information on how to order additional tes ting if desired.     Comment Comment     Comment: [employer] counselors are available for health care providers to**   discuss results at [phone]). Methodology: DNA analysis of the Factor V gene was performed by allele-specific PCR. The diagnostic sensitivity and specificity is >99% for both. Molecular-based testing is highly accurate, but as in any laboratory test, diagnostic errors may occur. All test results must be combined with clinical information for the most accurate interpretation. This test was developed and its performance characteristics determined by [facility]. It has not been cleared or approved by the Food and Drug Administration. References: Voelkerding K (1996).  Clin Lab Med [phone]. Allison Quarry, PhD, [facility] Ruben Reason, PhD, [facility] Jens Som, PhD, [facility] Annetta Maw, M.S., PhD, [facility] Alfredo Bach, PhD, [facility] Norva Riffle, PhD, [facility] Earlean Polka PhD, [facility]   Lupus anticoagulant panel     Status: Abnormal   Collection Time: 02/24/16  1:12 PM  Result Value Ref Range   PTT-LA 35.0 0.0 - 43.6 sec    Comment:                **Effective April 09, 2016 the reference interval for**                  PTT-LA, will be changing to:       0.0 - 51.9    dRVVT 81.5 (H) 0.0 - 47.0 sec    Comment:                              **Please note reference interval change**   dRVVT Mix 55.0 (H) 0.0 - 47.0 sec    Comment:                              **Please note reference interval change**   dRVVT Confirm 1.2 0.8 - 1.2 ratio   Lupus Reflex Interpretation Comment:     Comment: No lupus anticoagulant was detected. These results are consistent with specific inhibitors to one or more common pathway factors (X, V, II or fibrinogen).   Protein C, total     Status: None   Collection Time: 02/24/16  1:12 PM  Result Value Ref Range   Protein C Antigen 92 60 - 150 %  Protein C activity     Status: None   Collection Time: 02/24/16  1:12 PM  Result Value Ref Range    Protein C-Functional 145 73 - 180 %  Protein S activity     Status: Abnormal   Collection Time: 02/24/16  1:12 PM  Result Value Ref Range   Protein S-Functional 155 (H) 63 - 140 %    Comment: An elevated protein S activity is of no known clinical significance. Protein S activity may be falsely increased (masking an abnormal, low result) in patients receiving direct Xa inhibitor (e.g., rivaroxaban, apixaban, edoxaban) or a direct thrombin inhibitor (e.g., dabigatran) anticoagulant treatment due to assay interference by these drugs.   Protein S, total     Status: None   Collection Time: 02/24/16  1:12 PM  Result Value Ref Range   Protein S, Total 114 60 - 150 %    Comment: This test was developed and its performance characteristics determined by [facility]. It has not been cleared or approved by the Food and Drug Administration.   CMP STAT ([place] only)     Status: None   Collection Time: 02/24/16  1:12 PM  Result Value Ref Range   Sodium 140 128 - 145 mEq/L   Potassium 3.8 3.3 - 4.7 mEq/L   Chloride 102 98 - 108 mEq/L   CO2 26 18 - 33 mEq/L   Glucose, Bld 109 73 - 118 mg/dL   BUN, Bld 18 7 - 22 mg/dL   Creat 1.2 0.6 - 1.2 mg/dl   Total Bilirubin 1.00 0.20 - 1.60 mg/dl   Alkaline Phosphatase 80 26 - 84 U/L   AST 26 11 - 38 U/L   ALT(SGPT) 34 10 - 47 U/L   Total Protein 7.7 6.4 - 8.1 g/dL   Albumin 4.3 3.3 - 5.5 g/dL   Calcium 9.4 8.0 - 10.3 mg/dL  CBC with Differential [facility])     Status: None   Collection Time: 04/11/16  3:00 PM  Result Value Ref Range   WBC 7.7 4.0 - 10.0 10e3/uL   RBC 4.61 4.20 - 5.70 10e6/uL   HGB 15.0 13.0 - 17.1 g/dL   HCT 42.0 38.7 - 49.9 %   MCV 91 82 - 98 fL   MCH 32.5 28.0 - 33.4 pg   MCHC 35.7 32.0 - 35.9 g/dL   RDW 12.4 11.1 - 15.7 %   Platelets 220 145 - 400 10e3/uL   NEUT# 3.8 1.5 - 6.5 10e3/uL   LYMPH# 2.6 0.9 - 3.3 10e3/uL   MONO# 0.8 0.1 - 0.9 10e3/uL   Eosinophils Absolute 0.5 0.0 - 0.5 10e3/uL   BASO# 0.0  0.0 - 0.2 10e3/uL   NEUT% 48.7 40.0 - 80.0 %   LYMPH% 34.2 14.0 - 48.0 %   MONO% 10.5 0.0 - 13.0 %   EOS% 6.2 0.0 - 7.0 %   BASO% 0.4 0.0 - 2.0 %  Basic metabolic panel     Status: None   Collection Time: 04/11/16  3:05 PM  Result Value Ref Range   Glucose 91 65 - 99 mg/dL   BUN 17 6 - 24 mg/dL   Creatinine, Ser 0.99 0.76 - 1.27 mg/dL   GFR calc non Af Amer 86 >59 mL/min/1.73   GFR calc Af Amer 99 >59 mL/min/1.73   BUN/Creatinine Ratio 17 9 - 20   Sodium 135 134 - 144 mmol/L   Potassium, Ser 3.6 3.5 - 5.2 mmol/L   Chloride, Ser 103 96 - 106 mmol/L   Carbon Dioxide, Total 26 18 - 29 mmol/L   Calcium, Ser 9.5 8.7 - 10.2 mg/dL  CBC     Status: None  Collection Time: 04/24/16  3:19 PM  Result Value Ref Range   WBC 7.6 4.0 - 10.5 K/uL   RBC 4.72 4.22 - 5.81 Mil/uL   Platelets 219.0 150.0 - 400.0 K/uL   Hemoglobin 15.2 13.0 - 17.0 g/dL   HCT 43.7 39.0 - 52.0 %   MCV 92.5 78.0 - 100.0 fl   MCHC 34.8 30.0 - 36.0 g/dL   RDW 13.3 11.5 - 15.5 %  Comprehensive metabolic panel     Status: None   Collection Time: 04/24/16  3:19 PM  Result Value Ref Range   Sodium 141 135 - 145 mEq/L   Potassium 3.8 3.5 - 5.1 mEq/L   Chloride 105 96 - 112 mEq/L   CO2 25 19 - 32 mEq/L   Glucose, Bld 95 70 - 99 mg/dL   BUN 18 6 - 23 mg/dL   Creatinine, Ser 1.08 0.40 - 1.50 mg/dL   Total Bilirubin 0.6 0.2 - 1.2 mg/dL   Alkaline Phosphatase 85 39 - 117 U/L   AST 20 0 - 37 U/L   ALT 24 0 - 53 U/L   Total Protein 7.2 6.0 - 8.3 g/dL   Albumin 4.4 3.5 - 5.2 g/dL   Calcium 9.6 8.4 - 10.5 mg/dL   GFR 75.55 >60.00 mL/min  Hemoglobin A1c     Status: None   Collection Time: 04/24/16  3:19 PM  Result Value Ref Range   Hgb A1c MFr Bld 5.8 4.6 - 6.5 %    Comment: Glycemic Control Guidelines for People with Diabetes:Non Diabetic:  <6%Goal of Therapy: <7%Additional Action Suggested:  >8%   Lipid panel     Status: Abnormal   Collection Time: 04/24/16  3:19 PM  Result Value Ref Range   Cholesterol 181 0 - 200  mg/dL    Comment: ATP III Classification       Desirable:  < 200 mg/dL               Borderline High:  200 - 239 mg/dL          High:  > = 240 mg/dL   Triglycerides 154.0 (H) 0.0 - 149.0 mg/dL    Comment: Normal:  <150 mg/dLBorderline High:  150 - 199 mg/dL   HDL 43.30 >39.00 mg/dL   VLDL 30.8 0.0 - 40.0 mg/dL   LDL Cholesterol 107 (H) 0 - 99 mg/dL   Total CHOL/HDL Ratio 4     Comment:                Men          Women1/2 Average Risk     3.4          3.3Average Risk          5.0          4.42X Average Risk          9.6          7.13X Average Risk          15.0          11.0                       NonHDL 137.64     Comment: NOTE:  Non-HDL goal should be 30 mg/dL higher than patient's LDL goal (i.e. LDL goal of < 70 mg/dL, would have non-HDL goal of < 100 mg/dL)  PSA     Status: None   Collection Time: 04/24/16  3:19 PM  Result Value Ref Range   PSA 0.23 0.10 - 4.00 ng/mL  TSH     Status: None   Collection Time: 04/24/16  3:19 PM  Result Value Ref Range   TSH 3.25 0.35 - 4.50 uIU/mL    Assessment/Plan: Essential hypertension Controlled. Asymptomatic. Continue current regimen. Will check labs today.  Hypothyroidism Will repeat TSH level today.  Prostate cancer screening The natural history of prostate cancer and ongoing controversy regarding screening and potential treatment outcomes of prostate cancer has been discussed with the patient. The meaning of a false positive PSA and a false negative PSA has been discussed. He indicates understanding of the limitations of this screening test and wishes  to proceed with screening PSA testing.   Colon cancer screening Will refer to GI for screening colonoscopy. Orders placed.  Visit for preventive health examination Depression screen negative. Health Maintenance reviewed -- Immunizations, Colonoscopy and prostate cancer screening discussed. TDaP updated. Preventive schedule discussed and handout given in AVS. Will obtain fasting labs  today.      Leeanne Rio, PA-C

## 2016-04-24 NOTE — Progress Notes (Signed)
Pre visit review using our clinic review tool, if applicable. No additional management support is needed unless otherwise documented below in the visit note/SLS  

## 2016-04-25 LAB — LIPID PANEL
Cholesterol: 181 mg/dL (ref 0–200)
HDL: 43.3 mg/dL (ref >39.00)
LDL CALC: 107 mg/dL — AB (ref 0–99)
NonHDL: 137.64
TRIGLYCERIDES: 154 mg/dL — AB (ref 0.0–149.0)
Total CHOL/HDL Ratio: 4
VLDL: 30.8 mg/dL (ref 0.0–40.0)

## 2016-04-25 LAB — CBC
HCT: 43.7 % (ref 39.0–52.0)
HEMOGLOBIN: 15.2 g/dL (ref 13.0–17.0)
MCHC: 34.8 g/dL (ref 30.0–36.0)
MCV: 92.5 fl (ref 78.0–100.0)
Platelets: 219 10*3/uL (ref 150.0–400.0)
RBC: 4.72 Mil/uL (ref 4.22–5.81)
RDW: 13.3 % (ref 11.5–15.5)
WBC: 7.6 10*3/uL (ref 4.0–10.5)

## 2016-04-25 LAB — HEMOGLOBIN A1C: Hgb A1c MFr Bld: 5.8 % (ref 4.6–6.5)

## 2016-04-25 LAB — COMPREHENSIVE METABOLIC PANEL
ALT: 24 U/L (ref 0–53)
AST: 20 U/L (ref 0–37)
Albumin: 4.4 g/dL (ref 3.5–5.2)
Alkaline Phosphatase: 85 U/L (ref 39–117)
BUN: 18 mg/dL (ref 6–23)
CO2: 25 meq/L (ref 19–32)
Calcium: 9.6 mg/dL (ref 8.4–10.5)
Chloride: 105 mEq/L (ref 96–112)
Creatinine, Ser: 1.08 mg/dL (ref 0.40–1.50)
GFR: 75.55 mL/min (ref >60.00)
GLUCOSE: 95 mg/dL (ref 70–99)
POTASSIUM: 3.8 meq/L (ref 3.5–5.1)
SODIUM: 141 meq/L (ref 135–145)
Total Bilirubin: 0.6 mg/dL (ref 0.2–1.2)
Total Protein: 7.2 g/dL (ref 6.0–8.3)

## 2016-04-25 LAB — PSA: PSA: 0.23 ng/mL (ref 0.10–4.00)

## 2016-04-25 LAB — TSH: TSH: 3.25 u[IU]/mL (ref 0.35–4.50)

## 2016-04-29 DIAGNOSIS — Z125 Encounter for screening for malignant neoplasm of prostate: Secondary | ICD-10-CM | POA: Insufficient documentation

## 2016-04-29 DIAGNOSIS — Z1211 Encounter for screening for malignant neoplasm of colon: Secondary | ICD-10-CM | POA: Insufficient documentation

## 2016-04-29 DIAGNOSIS — Z0000 Encounter for general adult medical examination without abnormal findings: Secondary | ICD-10-CM | POA: Insufficient documentation

## 2016-04-29 NOTE — Assessment & Plan Note (Signed)
Depression screen negative. Health Maintenance reviewed -- Immunizations, Colonoscopy and prostate cancer screening discussed. TDaP updated. Preventive schedule discussed and handout given in AVS. Will obtain fasting labs today.

## 2016-04-29 NOTE — Assessment & Plan Note (Signed)
The natural history of prostate cancer and ongoing controversy regarding screening and potential treatment outcomes of prostate cancer has been discussed with the patient. The meaning of a false positive PSA and a false negative PSA has been discussed. He indicates understanding of the limitations of this screening test and wishes  to proceed with screening PSA testing.  

## 2016-04-29 NOTE — Assessment & Plan Note (Signed)
Will repeat TSH level today.

## 2016-04-29 NOTE — Assessment & Plan Note (Signed)
Controlled. Asymptomatic. Continue current regimen. Will check labs today.

## 2016-04-29 NOTE — Assessment & Plan Note (Signed)
Will refer to GI for screening colonoscopy. Orders placed.

## 2016-05-23 ENCOUNTER — Ambulatory Visit (HOSPITAL_BASED_OUTPATIENT_CLINIC_OR_DEPARTMENT_OTHER)
Admission: RE | Admit: 2016-05-23 | Discharge: 2016-05-23 | Disposition: A | Discharge status: Home / Self Care | Payer: BLUE CROSS/BLUE SHIELD | Source: Ambulatory Visit | Attending: Hematology & Oncology | Admitting: Hematology & Oncology

## 2016-05-23 ENCOUNTER — Ambulatory Visit (HOSPITAL_BASED_OUTPATIENT_CLINIC_OR_DEPARTMENT_OTHER): Payer: BLUE CROSS/BLUE SHIELD | Admitting: Hematology & Oncology

## 2016-05-23 ENCOUNTER — Encounter (HOSPITAL_BASED_OUTPATIENT_CLINIC_OR_DEPARTMENT_OTHER): Payer: Self-pay

## 2016-05-23 ENCOUNTER — Encounter: Payer: Self-pay | Admitting: Hematology & Oncology

## 2016-05-23 ENCOUNTER — Other Ambulatory Visit (HOSPITAL_BASED_OUTPATIENT_CLINIC_OR_DEPARTMENT_OTHER): Payer: BLUE CROSS/BLUE SHIELD

## 2016-05-23 VITALS — BP 134/78 | HR 72 | Temp 98.1°F | Resp 20 | Ht 73.0 in | Wt 354.0 lb

## 2016-05-23 DIAGNOSIS — R911 Solitary pulmonary nodule: Secondary | ICD-10-CM | POA: Diagnosis not present

## 2016-05-23 DIAGNOSIS — I82402 Acute embolism and thrombosis of unspecified deep veins of left lower extremity: Secondary | ICD-10-CM

## 2016-05-23 DIAGNOSIS — I2699 Other pulmonary embolism without acute cor pulmonale: Secondary | ICD-10-CM

## 2016-05-23 DIAGNOSIS — I82532 Chronic embolism and thrombosis of left popliteal vein: Secondary | ICD-10-CM | POA: Diagnosis not present

## 2016-05-23 DIAGNOSIS — I82412 Acute embolism and thrombosis of left femoral vein: Secondary | ICD-10-CM | POA: Diagnosis not present

## 2016-05-23 DIAGNOSIS — I82432 Acute embolism and thrombosis of left popliteal vein: Secondary | ICD-10-CM | POA: Diagnosis not present

## 2016-05-23 LAB — COMPREHENSIVE METABOLIC PANEL
ALK PHOS: 88 U/L (ref 40–150)
ALT: 22 U/L (ref 0–55)
ANION GAP: 9 meq/L (ref 3–11)
AST: 19 U/L (ref 5–34)
Albumin: 3.8 g/dL (ref 3.5–5.0)
BILIRUBIN TOTAL: 0.57 mg/dL (ref 0.20–1.20)
BUN: 16.9 mg/dL (ref 7.0–26.0)
CO2: 25 meq/L (ref 22–29)
Calcium: 9.2 mg/dL (ref 8.4–10.4)
Chloride: 103 mEq/L (ref 98–109)
Creatinine: 1.1 mg/dL (ref 0.7–1.3)
EGFR: 75 mL/min/{1.73_m2} — AB (ref >90)
Glucose: 149 mg/dl — ABNORMAL HIGH (ref 70–140)
POTASSIUM: 4.2 meq/L (ref 3.5–5.1)
SODIUM: 138 meq/L (ref 136–145)
TOTAL PROTEIN: 6.9 g/dL (ref 6.4–8.3)

## 2016-05-23 LAB — CBC WITH DIFFERENTIAL (CANCER CENTER ONLY)
BASO#: 0 10*3/uL (ref 0.0–0.2)
BASO%: 0.2 % (ref 0.0–2.0)
EOS ABS: 0.4 10*3/uL (ref 0.0–0.5)
EOS%: 6.5 % (ref 0.0–7.0)
HCT: 42.6 % (ref 38.7–49.9)
HEMOGLOBIN: 15.1 g/dL (ref 13.0–17.1)
LYMPH#: 2.3 10*3/uL (ref 0.9–3.3)
LYMPH%: 35.2 % (ref 14.0–48.0)
MCH: 32.5 pg (ref 28.0–33.4)
MCHC: 35.4 g/dL (ref 32.0–35.9)
MCV: 92 fL (ref 82–98)
MONO#: 0.4 10*3/uL (ref 0.1–0.9)
MONO%: 6.1 % (ref 0.0–13.0)
NEUT%: 52 % (ref 40.0–80.0)
NEUTROS ABS: 3.3 10*3/uL (ref 1.5–6.5)
PLATELETS: 195 10*3/uL (ref 145–400)
RBC: 4.64 10*6/uL (ref 4.20–5.70)
RDW: 12.3 % (ref 11.1–15.7)
WBC: 6.4 10*3/uL (ref 4.0–10.0)

## 2016-05-23 MED ORDER — IOPAMIDOL (ISOVUE-370) INJECTION 76%
100.0000 mL | Freq: Once | INTRAVENOUS | Status: AC | PRN
  Administered 2016-05-23: 100 mL via INTRAVENOUS

## 2016-05-23 NOTE — Progress Notes (Signed)
Hematology and Oncology Follow Up Visit  [name] [id number] Jan 30, 1961 55 y.o. 05/23/2016   Principle Diagnosis:   Idiopathic pulmonary embolism and left leg DVT  Current Therapy:    ELIQUIS 5 mg by mouth twice a day-to finish in April 2018     Interim History:  [name] is back for follow-up. He is doing quite well. His CT angiogram done today did not show any residual pulmonary was him. The Doppler of his left leg showed a nonocclusive thrombus in the left popliteal vein to mid left femoral vein.  His left leg is not getting swollen. He is working without difficulty. He does wear a compression stocking on occasion.  He does not have any cough. There's no chest wall pain. He's had no bleeding. He's had no change in bowel or bladder habits. He's had no rashes.  He still is working full-time at [employer].   He is again [place] to Labor Day weekend when he and his wife and some friends will go up to for a huge flea market in [place].  Overall, his performance status is ECOG 0.  Medications:  Current Outpatient Prescriptions:  .  apixaban (ELIQUIS) 5 MG TABS tablet, Take 1 tablet (5 mg total) by mouth 2 (two) times daily., Disp: 60 tablet, Rfl: 4 .  carvedilol (COREG) 12.5 MG tablet, Take 1 tablet (12.5 mg total) by mouth at bedtime., Disp: 30 tablet, Rfl: 3 .  furosemide (LASIX) 40 MG tablet, TAKE 1 TABLET BY MOUTH TWICE DAILY, Disp: 60 tablet, Rfl: 1 .  potassium chloride SA (K-DUR,KLOR-CON) 20 MEQ tablet, Take 1 tablet (20 mEq total) by mouth daily., Disp: 30 tablet, Rfl: 3  Allergies: No Known Allergies  Past Medical History, Surgical history, Social history, and Family History were reviewed and updated.  Review of Systems: As above  Physical Exam:  height is 6\' 1"  (1.854 m) and weight is 354 lb (160.6 kg) (abnormal). His oral temperature is 98.1 F (36.7 C). His blood pressure is 134/78 and his pulse is 72. His respiration is 20.   Wt Readings from  Last 3 Encounters:  05/23/16 (!) 354 lb (160.6 kg)  04/24/16 (!) 352 lb 2 oz (159.7 kg)  04/11/16 (!) 351 lb 1.9 oz (159.3 kg)     Obese white male in no obvious distress. Head neck exam shows no ocular or oral lesions. There are no palpable cervical or supraclavicular lymph nodes. Lungs are clear. Cardiac exam regular rate and rhythm with no murmurs, rubs or bruits. Abdomen is soft. He is obese. Has good bowel sounds. There is no fluid wave. There is no palpable liver or spleen tip. Back exam shows no tenderness over the spine, ribs or hips. Extremities shows mild nonpitting edema of the left leg. No venous cord is noted. Has good pulses in his distal extremities. Skin exam shows no rashes, ecchymoses or petechia. Neurological exam shows no focal neurological deficits.  Lab Results  Component Value Date   WBC 6.4 05/23/2016   HGB 15.1 05/23/2016   HCT 42.6 05/23/2016   MCV 92 05/23/2016   PLT 195 05/23/2016     Chemistry      Component Value Date/Time   NA 141 04/24/2016 1519   NA 135 04/11/2016 1505   NA 140 02/24/2016 1312   K 3.8 04/24/2016 1519   K 3.6 04/11/2016 1505   K 3.8 02/24/2016 1312   CL 105 04/24/2016 1519   CL 103 04/11/2016 1505   CL  102 02/24/2016 1312   CO2 25 04/24/2016 1519   CO2 26 04/11/2016 1505   CO2 26 02/24/2016 1312   BUN 18 04/24/2016 1519   BUN 17 04/11/2016 1505   BUN 18 02/24/2016 1312   CREATININE 1.08 04/24/2016 1519   CREATININE 0.99 04/11/2016 1505   CREATININE 1.2 02/24/2016 1312      Component Value Date/Time   CALCIUM 9.6 04/24/2016 1519   CALCIUM 9.5 04/11/2016 1505   CALCIUM 9.4 02/24/2016 1312   ALKPHOS 85 04/24/2016 1519   ALKPHOS 80 02/24/2016 1312   AST 20 04/24/2016 1519   AST 26 02/24/2016 1312   ALT 24 04/24/2016 1519   ALT 34 02/24/2016 1312   BILITOT 0.6 04/24/2016 1519   BILITOT 1.00 02/24/2016 1312         Impression and Plan: [name] is a 55 year old white male. He has an idiopathic pulmonary embolus  and left lower extremity thrombus.  I am thankful that the pulmonary embolism has resolved. He is somewhat emotional today because a coworker of his died of a blood clot to his lung recently. This is been very hard on [name]. That co- worker had a 63 year old son.  I still feel that 1 year of anticoagulation is necessary. He will finish up in April 2018. After that, we will plan for maintenance anticoagulation at half dose.  I do want to do another Doppler of his left leg was back in 4 months.  I talked to him about my move back to [place]. He would like to follow me back there. I will be 1 happy and privileged to be able to continue to see him.    Josph Macho, MD 8/9/201712:09 PM

## 2016-05-24 DIAGNOSIS — I10 Essential (primary) hypertension: Secondary | ICD-10-CM | POA: Diagnosis not present

## 2016-05-24 DIAGNOSIS — E669 Obesity, unspecified: Secondary | ICD-10-CM | POA: Diagnosis not present

## 2016-05-24 DIAGNOSIS — G4733 Obstructive sleep apnea (adult) (pediatric): Secondary | ICD-10-CM | POA: Diagnosis not present

## 2016-05-24 DIAGNOSIS — Z6841 Body Mass Index (BMI) 40.0 and over, adult: Secondary | ICD-10-CM | POA: Diagnosis not present

## 2016-06-05 DIAGNOSIS — G4733 Obstructive sleep apnea (adult) (pediatric): Secondary | ICD-10-CM | POA: Diagnosis not present

## 2016-06-16 ENCOUNTER — Other Ambulatory Visit: Payer: Self-pay | Admitting: Physician Assistant

## 2016-06-16 DIAGNOSIS — R600 Localized edema: Secondary | ICD-10-CM

## 2016-06-19 NOTE — Telephone Encounter (Signed)
Rx request to pharmacy/SLS  

## 2016-07-28 ENCOUNTER — Other Ambulatory Visit: Payer: Self-pay | Admitting: Physician Assistant

## 2016-07-31 ENCOUNTER — Encounter: Payer: Self-pay | Admitting: Physician Assistant

## 2016-08-15 ENCOUNTER — Encounter: Payer: Self-pay | Admitting: Physician Assistant

## 2016-08-15 ENCOUNTER — Ambulatory Visit (INDEPENDENT_AMBULATORY_CARE_PROVIDER_SITE_OTHER): Payer: BLUE CROSS/BLUE SHIELD | Admitting: Physician Assistant

## 2016-08-15 ENCOUNTER — Encounter (INDEPENDENT_AMBULATORY_CARE_PROVIDER_SITE_OTHER): Payer: Self-pay

## 2016-08-15 VITALS — BP 132/84 | HR 57 | Ht 73.0 in | Wt 360.0 lb

## 2016-08-15 DIAGNOSIS — Z1211 Encounter for screening for malignant neoplasm of colon: Secondary | ICD-10-CM

## 2016-08-15 NOTE — Progress Notes (Signed)
Subjective:    Patient ID: [name], male    DOB: Jan 10, 1961, 55 y.o.   MRN: [id number]  HPI Aurther Lofterry is a pleasant 55 year old white male referred today by Marcelline MatesWilliam Martin PA-C/primary care for screening colonoscopy. Patient has no current GI symptoms. He has not had any prior GI evaluation. Family history is negative for colon cancer and polyps as far as he is aware. Patient has history of morbid obesity with current weight of 360, hypertension, and is being followed by Dr. Myna HidalgoEnnever  for  idiopathic DVT and massive PE , associated with right heart strain in April 2017. This was an unprovoked event. He is currently being maintained on eliquis. I have reviewed Dr. Jules HusbandsEnnervers notes and his plan is to keep him continuously anticoagulated until April 2018 and then consider decreasing to maintenance dose eliquis at that time. Reviewed GI issues with patient and he denies any problems with abdominal pain ,changes in bowel habits, melena or hematochezia.  Review of Systems Pertinent positive and negative review of systems were noted in the above HPI section.  All other review of systems was otherwise negative.  Outpatient Encounter Prescriptions as of 08/15/2016  Medication Sig  . carvedilol (COREG) 12.5 MG tablet TAKE ONE (1) TABLET BY MOUTH AT BEDTIME  . ELIQUIS 5 MG TABS tablet TAKE ONE TABLET BY MOUTH TWICE DAILY  . furosemide (LASIX) 40 MG tablet TAKE ONE (1) TABLET BY MOUTH TWO (2) TIMES DAILY  . potassium chloride SA (K-DUR,KLOR-CON) 20 MEQ tablet TAKE 1 TABLET BY MOUTH DAILY   No facility-administered encounter medications on file as of 08/15/2016.    No Known Allergies Patient Active Problem List   Diagnosis Date Noted  . Prostate cancer screening 04/29/2016  . Visit for preventive health examination 04/29/2016  . Colon cancer screening 04/29/2016  . Acute deep vein thrombosis (DVT) of left lower extremity (HCC) 02/05/2016  . Hypothyroidism 02/05/2016  . Bilateral edema of lower  extremity 02/05/2016  . Acute pulmonary embolism (HCC) 02/04/2016  . Obstructive sleep apnea on CPAP 02/04/2016  . Lung nodule 02/04/2016  . Morbid obesity (HCC) 02/04/2016  . Essential hypertension 02/04/2016   Social History   Social History  . Marital status: Married    Spouse name: N/A  . Number of children: N/A  . Years of education: N/A   Occupational History  . Not on file.   Social History Main Topics  . Smoking status: Never Smoker  . Smokeless tobacco: Never Used  . Alcohol use No  . Drug use: No  . Sexual activity: No   Other Topics Concern  . Not on file   Social History Narrative  . No narrative on file    Mr. Dieterich's family history includes Allergies in his son; Anemia in his mother; Heart disease in his mother; Hypertension in his father, other, and sister; Migraines in his sister; Pulmonary embolism in his mother.      Objective:    Vitals:   08/15/16 0834  BP: 132/84  Pulse: (!) 57    Physical Exam well-developed white male in no acute distress, pleasant blood pressure 132/84 pulse 57, BMI 47.5, weight 360. HEENT ;nontraumatic normocephalic EOMI PERRLA sclera anicteric, Not further examined today discussion only       Assessment & Plan:   #641 55 year old white male, morbidly obese, average risk and asymptomatic who is referred for screening colonoscopy. #2 chronic anticoagulation-on eliquis #3 acute submassive PE and left lower extremity DVT April 2017, unprovoked-plan is for  one year of continuous anticoagulation and then consideration of maintenance dosing  Plan; Patient should not have interruption of his anticoagulation at this time, and therefore will defer screening colonoscopy to spring of 2018 after he has had one continuous year of anticoagulation. Colonoscopy was discussed in detail with the patient and rationale for deferring. He is comfortable with this plan Have asked patient to schedule a follow-up visit in April 2018, and can  schedule for colonoscopy at that time. He will need to be scheduled at the hospital due to weight of greater than 350 pounds Patient will be established with Dr. Christella HartiganJacobs.  Greater than 50% of visit involved  medical counseling and coordination of care.  Jasan Doughtie S Ann Bohne PA-C 08/15/2016   Cc: Waldon MerlMartin, William C, PA-C

## 2016-08-15 NOTE — Progress Notes (Signed)
I agree with the above note, plan. BMI 48, weight 360 pounds will increase his risk for procedure related complications.

## 2016-08-15 NOTE — Patient Instructions (Signed)
We made you a recall appointment date for April 2018. You will get a letter the month before.

## 2016-09-11 ENCOUNTER — Other Ambulatory Visit: Payer: Self-pay | Admitting: Emergency Medicine

## 2016-09-11 MED ORDER — APIXABAN 5 MG PO TABS
5.0000 mg | ORAL_TABLET | Freq: Two times a day (BID) | ORAL | 6 refills | Status: DC
Start: 2016-09-11 — End: 2017-01-25

## 2016-09-26 ENCOUNTER — Telehealth: Payer: Self-pay | Admitting: *Deleted

## 2016-09-26 ENCOUNTER — Ambulatory Visit (HOSPITAL_BASED_OUTPATIENT_CLINIC_OR_DEPARTMENT_OTHER): Payer: BLUE CROSS/BLUE SHIELD | Admitting: Hematology & Oncology

## 2016-09-26 ENCOUNTER — Other Ambulatory Visit (HOSPITAL_BASED_OUTPATIENT_CLINIC_OR_DEPARTMENT_OTHER): Payer: BLUE CROSS/BLUE SHIELD

## 2016-09-26 ENCOUNTER — Ambulatory Visit (HOSPITAL_BASED_OUTPATIENT_CLINIC_OR_DEPARTMENT_OTHER)
Admission: RE | Admit: 2016-09-26 | Discharge: 2016-09-26 | Disposition: A | Discharge status: Home / Self Care | Payer: BLUE CROSS/BLUE SHIELD | Source: Ambulatory Visit | Attending: Hematology & Oncology | Admitting: Hematology & Oncology

## 2016-09-26 VITALS — BP 156/87 | HR 52 | Temp 98.3°F | Resp 18

## 2016-09-26 DIAGNOSIS — I82402 Acute embolism and thrombosis of unspecified deep veins of left lower extremity: Secondary | ICD-10-CM

## 2016-09-26 DIAGNOSIS — I82432 Acute embolism and thrombosis of left popliteal vein: Secondary | ICD-10-CM | POA: Diagnosis not present

## 2016-09-26 DIAGNOSIS — I2699 Other pulmonary embolism without acute cor pulmonale: Secondary | ICD-10-CM

## 2016-09-26 LAB — CBC WITH DIFFERENTIAL (CANCER CENTER ONLY)
BASO#: 0 10*3/uL (ref 0.0–0.2)
BASO%: 0.5 % (ref 0.0–2.0)
EOS ABS: 0.5 10*3/uL (ref 0.0–0.5)
EOS%: 8 % — ABNORMAL HIGH (ref 0.0–7.0)
HEMATOCRIT: 42.6 % (ref 38.7–49.9)
HGB: 15.3 g/dL (ref 13.0–17.1)
LYMPH#: 2.6 10*3/uL (ref 0.9–3.3)
LYMPH%: 39.9 % (ref 14.0–48.0)
MCH: 32.9 pg (ref 28.0–33.4)
MCHC: 35.9 g/dL (ref 32.0–35.9)
MCV: 92 fL (ref 82–98)
MONO#: 0.7 10*3/uL (ref 0.1–0.9)
MONO%: 10.3 % (ref 0.0–13.0)
NEUT#: 2.7 10*3/uL (ref 1.5–6.5)
NEUT%: 41.3 % (ref 40.0–80.0)
Platelets: 187 10*3/uL (ref 145–400)
RBC: 4.65 10*6/uL (ref 4.20–5.70)
RDW: 11.8 % (ref 11.1–15.7)
WBC: 6.6 10*3/uL (ref 4.0–10.0)

## 2016-09-26 NOTE — Telephone Encounter (Addendum)
Patient aware of results  ----- Message from Josph MachoPeter R Ennever, MD sent at 09/26/2016  2:05 PM EST ----- Call - there is only a minimal amount of blood clot in the left leg!!  This is much better than the last doppler test!!!  [name]

## 2016-09-26 NOTE — Progress Notes (Signed)
Hematology and Oncology Follow Up Visit  [name] [id number] Mar 19, 1961 55 y.o. 09/26/2016   Principle Diagnosis:   Idiopathic pulmonary embolism and left leg DVT  Current Therapy:    ELIQUIS 5 mg by mouth twice a day-to finish in April 2018     Interim History:  [name] is back for follow-up. He is doing quite well. He was supposed to have a Doppler of his left leg this morning. This was rescheduled until this afternoon.  He is still working. He's having no problems at work.  He was supposed to have a colonoscopy. However, the group that he went to really made a big deal out of him being on blood thinner. They did not even bother to call me to let them know that it was safe to do a colonoscopy. As such, he wants to see somebody else.  He has had no problems with bleeding. He's had no cough or shortness of breath. He's had no abdominal pain. He's had no leg pain or swelling.   Overall, his performance status is ECOG 0.  Medications:  Current Outpatient Prescriptions:  .  apixaban (ELIQUIS) 5 MG TABS tablet, Take 1 tablet (5 mg total) by mouth 2 (two) times daily., Disp: 60 tablet, Rfl: 6 .  carvedilol (COREG) 12.5 MG tablet, TAKE ONE (1) TABLET BY MOUTH AT BEDTIME, Disp: 30 tablet, Rfl: 2 .  furosemide (LASIX) 40 MG tablet, TAKE ONE (1) TABLET BY MOUTH TWO (2) TIMES DAILY, Disp: 60 tablet, Rfl: 2 .  potassium chloride SA (K-DUR,KLOR-CON) 20 MEQ tablet, TAKE 1 TABLET BY MOUTH DAILY, Disp: 30 tablet, Rfl: 2  Allergies: No Known Allergies  Past Medical History, Surgical history, Social history, and Family History were reviewed and updated.  Review of Systems: As above  Physical Exam:  oral temperature is 98.3 F (36.8 C). His blood pressure is 156/87 (abnormal) and his pulse is 52 (abnormal). His respiration is 18 and oxygen saturation is 99%.   Wt Readings from Last 3 Encounters:  08/15/16 (!) 360 lb (163.3 kg)  05/23/16 (!) 354 lb (160.6 kg)  04/24/16 (!)  352 lb 2 oz (159.7 kg)     Obese white male in no obvious distress. Head neck exam shows no ocular or oral lesions. There are no palpable cervical or supraclavicular lymph nodes. Lungs are clear. Cardiac exam regular rate and rhythm with no murmurs, rubs or bruits. Abdomen is soft. He is obese. Has good bowel sounds. There is no fluid wave. There is no palpable liver or spleen tip. Back exam shows no tenderness over the spine, ribs or hips. Extremities shows mild nonpitting edema of the left leg. No venous cord is noted. Has good pulses in his distal extremities. Skin exam shows no rashes, ecchymoses or petechia. Neurological exam shows no focal neurological deficits.  Lab Results  Component Value Date   WBC 6.6 09/26/2016   HGB 15.3 09/26/2016   HCT 42.6 09/26/2016   MCV 92 09/26/2016   PLT 187 09/26/2016     Chemistry      Component Value Date/Time   NA 138 05/23/2016 1101   K 4.2 05/23/2016 1101   CL 105 04/24/2016 1519   CL 103 04/11/2016 1505   CL 102 02/24/2016 1312   CO2 25 05/23/2016 1101   BUN 16.9 05/23/2016 1101   CREATININE 1.1 05/23/2016 1101      Component Value Date/Time   CALCIUM 9.2 05/23/2016 1101   ALKPHOS 88 05/23/2016 1101   AST  19 05/23/2016 1101   ALT 22 05/23/2016 1101   BILITOT 0.57 05/23/2016 1101         Impression and Plan: [name] is a 55 year old white male. He has an idiopathic pulmonary embolus and left lower extremity thrombus.  I am thankful that the pulmonary embolism has resolved.   We will see with the Doppler shows.  He does do the colonoscopy. We will see about making a referral to Dr. Matthias HughsBuccini.  I will see him back in 4 months. We'll see him back, we will  switch him over to low-dose ELIQUIS. I would like to  keep him on this for one year.  Josph MachoENNEVER,Lana Flaim R, MD 12/13/201710:45 AM

## 2016-09-27 ENCOUNTER — Telehealth: Payer: Self-pay | Admitting: *Deleted

## 2016-09-27 LAB — D-DIMER, QUANTITATIVE: D-DIMER: 0.54 mg/L FEU — ABNORMAL HIGH (ref 0.00–0.49)

## 2016-09-27 NOTE — Telephone Encounter (Signed)
Appointment made with Dr Matthias HughsBuccini office  Tuesday January 16th at 2:45p [phone]  Patient aware of location and time

## 2016-10-18 ENCOUNTER — Other Ambulatory Visit: Payer: Self-pay | Admitting: Emergency Medicine

## 2016-10-18 DIAGNOSIS — R600 Localized edema: Secondary | ICD-10-CM

## 2016-10-18 MED ORDER — POTASSIUM CHLORIDE CRYS ER 20 MEQ PO TBCR: 20.0000 meq | EXTENDED_RELEASE_TABLET | Freq: Every day | ORAL | 6 refills | Status: DC

## 2016-10-22 ENCOUNTER — Telehealth: Payer: Self-pay | Admitting: Physician Assistant

## 2016-10-22 ENCOUNTER — Other Ambulatory Visit: Payer: Self-pay | Admitting: Emergency Medicine

## 2016-10-22 DIAGNOSIS — R600 Localized edema: Secondary | ICD-10-CM

## 2016-10-22 MED ORDER — CARVEDILOL 12.5 MG PO TABS: ORAL_TABLET | ORAL | 6 refills | Status: DC

## 2016-10-22 MED ORDER — POTASSIUM CHLORIDE CRYS ER 20 MEQ PO TBCR: 20.0000 meq | EXTENDED_RELEASE_TABLET | Freq: Every day | ORAL | 6 refills | Status: DC

## 2016-10-22 MED ORDER — FUROSEMIDE 40 MG PO TABS: ORAL_TABLET | ORAL | 6 refills | Status: DC

## 2016-10-22 NOTE — Telephone Encounter (Signed)
Refilled medication to the [facility]

## 2016-10-22 NOTE — Telephone Encounter (Signed)
Pt states that he needs refill on lasix, potassium, carvedilol, sent to [facility]

## 2016-10-30 DIAGNOSIS — Z1211 Encounter for screening for malignant neoplasm of colon: Secondary | ICD-10-CM | POA: Diagnosis not present

## 2016-10-30 DIAGNOSIS — Z01818 Encounter for other preprocedural examination: Secondary | ICD-10-CM | POA: Diagnosis not present

## 2016-11-28 ENCOUNTER — Encounter (HOSPITAL_COMMUNITY): Payer: Self-pay | Admitting: *Deleted

## 2016-11-29 ENCOUNTER — Ambulatory Visit (HOSPITAL_COMMUNITY)
Admission: RE | Admit: 2016-11-29 | Discharge: 2016-11-29 | Disposition: A | Discharge status: Home / Self Care | Payer: BLUE CROSS/BLUE SHIELD | Source: Ambulatory Visit | Attending: Gastroenterology | Admitting: Gastroenterology

## 2016-11-29 ENCOUNTER — Ambulatory Visit (HOSPITAL_COMMUNITY): Payer: BLUE CROSS/BLUE SHIELD | Admitting: Anesthesiology

## 2016-11-29 ENCOUNTER — Ambulatory Visit: Payer: Self-pay | Admitting: Gastroenterology

## 2016-11-29 ENCOUNTER — Encounter (HOSPITAL_COMMUNITY)
Admission: RE | Disposition: A | Discharge status: Home / Self Care | Payer: Self-pay | Source: Ambulatory Visit | Attending: Gastroenterology

## 2016-11-29 ENCOUNTER — Encounter (HOSPITAL_COMMUNITY): Payer: Self-pay

## 2016-11-29 DIAGNOSIS — Z86711 Personal history of pulmonary embolism: Secondary | ICD-10-CM | POA: Diagnosis not present

## 2016-11-29 DIAGNOSIS — Z7901 Long term (current) use of anticoagulants: Secondary | ICD-10-CM | POA: Insufficient documentation

## 2016-11-29 DIAGNOSIS — Z6841 Body Mass Index (BMI) 40.0 and over, adult: Secondary | ICD-10-CM | POA: Insufficient documentation

## 2016-11-29 DIAGNOSIS — I10 Essential (primary) hypertension: Secondary | ICD-10-CM | POA: Diagnosis not present

## 2016-11-29 DIAGNOSIS — K635 Polyp of colon: Secondary | ICD-10-CM | POA: Insufficient documentation

## 2016-11-29 DIAGNOSIS — K5730 Diverticulosis of large intestine without perforation or abscess without bleeding: Secondary | ICD-10-CM | POA: Insufficient documentation

## 2016-11-29 DIAGNOSIS — K621 Rectal polyp: Secondary | ICD-10-CM | POA: Insufficient documentation

## 2016-11-29 DIAGNOSIS — D127 Benign neoplasm of rectosigmoid junction: Secondary | ICD-10-CM | POA: Diagnosis not present

## 2016-11-29 DIAGNOSIS — Z86718 Personal history of other venous thrombosis and embolism: Secondary | ICD-10-CM | POA: Insufficient documentation

## 2016-11-29 DIAGNOSIS — Z79899 Other long term (current) drug therapy: Secondary | ICD-10-CM | POA: Insufficient documentation

## 2016-11-29 DIAGNOSIS — Z1211 Encounter for screening for malignant neoplasm of colon: Secondary | ICD-10-CM | POA: Insufficient documentation

## 2016-11-29 HISTORY — PX: COLONOSCOPY WITH PROPOFOL: SHX5780

## 2016-11-29 SURGERY — COLONOSCOPY WITH PROPOFOL
Anesthesia: Monitor Anesthesia Care

## 2016-11-29 MED ORDER — PROPOFOL 10 MG/ML IV BOLUS
INTRAVENOUS | Status: DC | PRN
  Administered 2016-11-29: 20 mg via INTRAVENOUS

## 2016-11-29 MED ORDER — LACTATED RINGERS IV SOLN
INTRAVENOUS | Status: DC
Start: 2016-11-29 — End: 2016-11-29
  Administered 2016-11-29: 1000 mL via INTRAVENOUS

## 2016-11-29 MED ORDER — SODIUM CHLORIDE 0.9 % IV SOLN: INTRAVENOUS | Status: DC

## 2016-11-29 MED ORDER — LIDOCAINE 2% (20 MG/ML) 5 ML SYRINGE
INTRAMUSCULAR | Status: DC | PRN
  Administered 2016-11-29: 100 mg via INTRAVENOUS

## 2016-11-29 MED ORDER — PROPOFOL 10 MG/ML IV BOLUS
INTRAVENOUS | Status: AC
  Filled 2016-11-29: qty 20

## 2016-11-29 MED ORDER — PROPOFOL 10 MG/ML IV BOLUS
INTRAVENOUS | Status: AC
  Filled 2016-11-29: qty 60

## 2016-11-29 MED ORDER — LIDOCAINE 2% (20 MG/ML) 5 ML SYRINGE
INTRAMUSCULAR | Status: AC
  Filled 2016-11-29: qty 5

## 2016-11-29 MED ORDER — PROPOFOL 500 MG/50ML IV EMUL
INTRAVENOUS | Status: DC | PRN
  Administered 2016-11-29: 140 ug/kg/min via INTRAVENOUS

## 2016-11-29 SURGICAL SUPPLY — 21 items

## 2016-11-29 NOTE — Transfer of Care (Signed)
Immediate Anesthesia Transfer of Care Note  Patient: [name]  Procedure(s) Performed: Procedure(s): COLONOSCOPY WITH PROPOFOL (N/A)  Patient Location: Endoscopy Unit  Anesthesia Type:MAC  Level of Consciousness: awake  Airway & Oxygen Therapy: Patient Spontanous Breathing and Patient connected to face mask oxygen  Post-op Assessment: Report given to RN and Post -op Vital signs reviewed and stable  Post vital signs: Reviewed and stable  Last Vitals:  Vitals:   11/29/16 0946  BP: (!) 169/60  Pulse: 62  Resp: 11  Temp: 36.7 C    Last Pain:  Vitals:   11/29/16 0946  TempSrc: Oral         Complications: No apparent anesthesia complications

## 2016-11-29 NOTE — Anesthesia Preprocedure Evaluation (Signed)
Anesthesia Evaluation  Patient identified by MRN, date of birth, ID band Patient awake    Reviewed: Allergy & Precautions, NPO status , Patient's Chart, lab work & pertinent test results  Airway Mallampati: I  TM Distance: >3 FB Neck ROM: Full    Dental  (+) Teeth Intact, Dental Advisory Given   Pulmonary sleep apnea ,  Hx of pulm embolism   breath sounds clear to auscultation       Cardiovascular hypertension,  Rhythm:Regular Rate:Normal     Neuro/Psych    GI/Hepatic GERD  ,  Endo/Other  Hypothyroidism Morbid obesity  Renal/GU      Musculoskeletal   Abdominal (+) + obese,   Peds  Hematology   Anesthesia Other Findings   Reproductive/Obstetrics                             Anesthesia Physical Anesthesia Plan  ASA: III  Anesthesia Plan: MAC   Post-op Pain Management:    Induction: Intravenous  Airway Management Planned: Simple Face Mask and Natural Airway  Additional Equipment:   Intra-op Plan:   Post-operative Plan:   Informed Consent: I have reviewed the patients History and Physical, chart, labs and discussed the procedure including the risks, benefits and alternatives for the proposed anesthesia with the patient or authorized representative who has indicated his/her understanding and acceptance.     Plan Discussed with:   Anesthesia Plan Comments:         Anesthesia Quick Evaluation

## 2016-11-29 NOTE — Anesthesia Postprocedure Evaluation (Addendum)
Anesthesia Post Note  Patient: [name]  Procedure(s) Performed: Procedure(s) (LRB): COLONOSCOPY WITH PROPOFOL (N/A)  Patient location during evaluation: Endoscopy Anesthesia Type: MAC Level of consciousness: awake and alert Pain management: pain level controlled Vital Signs Assessment: post-procedure vital signs reviewed and stable Respiratory status: spontaneous breathing, nonlabored ventilation, respiratory function stable and patient connected to nasal cannula oxygen Cardiovascular status: stable and blood pressure returned to baseline Anesthetic complications: no       Last Vitals:  Vitals:   11/29/16 1257 11/29/16 1300  BP: (!) 160/74   Pulse: 66   Resp: 13   Temp:  36.6 C    Last Pain:  Vitals:   11/29/16 1300  TempSrc: Axillary                 Amarian Botero,JAMES TERRILL

## 2016-11-29 NOTE — Op Note (Signed)
[facility] Patient Name: [name] Procedure Date: 11/29/2016 MRN: [id number] Attending MD: Bernette Redbird , MD Date of Birth: [birth year] CSN: [id number] Age: 56 Admit Type: Outpatient Procedure:                Colonoscopy Indications:              Screening for colorectal malignant neoplasm, This                            is the patient's first colonoscopy. Pt is approx 1                            yr s/p DVT/PE and is maintained on Eliquis. The                            procedure is being done at the hospital because of                            large body size (BMI 49) Providers:                Bernette Redbird, MD, Norman Clay, RN, Arlee Muslim                            Tech., Technician, Leroy Libman, CRNA Referring MD:              Medicines:                Monitored Anesthesia Care Complications:            No immediate complications. Estimated Blood Loss:     Estimated blood loss was minimal. Procedure:                Pre-Anesthesia Assessment:                           - Prior to the procedure, a History and Physical                            was performed, and patient medications and                            allergies were reviewed. The patient's tolerance of                            previous anesthesia was also reviewed. The risks                            and benefits of the procedure and the sedation                            options and risks were discussed with the patient.                            All questions were answered, and informed consent  was obtained. Prior Anticoagulants: The patient has                            taken Eliquis (apixaban), last dose was 1 day prior                            to procedure. ASA Grade Assessment: III - A patient                            with severe systemic disease. After reviewing the                            risks and benefits, the patient was deemed in                             satisfactory condition to undergo the procedure.                           After obtaining informed consent, the colonoscope                            was passed under direct vision. Throughout the                            procedure, the patient's blood pressure, pulse, and                            oxygen saturations were monitored continuously. The                            [id number] ([id number]) adult colonoscope was                            introduced through the anus and advanced to the the                            terminal ileum. The colonoscopy was performed                            without difficulty. The patient tolerated the                            procedure well. The quality of the bowel                            preparation was good. The terminal ileum, ileocecal                            valve, appendiceal orifice, and rectum were                            photographed. Scope In: 12:29:00 PM Scope Out: 12:50:37 PM Scope Withdrawal Time: 0 hours 18 minutes 0 seconds  Total Procedure Duration: 0  hours 21 minutes 37 seconds  Findings:      The digital rectal exam was normal. Pertinent negatives include normal       prostate (size, shape, and consistency).      A 3 mm polyp was found in the recto-sigmoid colon. The polyp was       sessile. Biopsies were taken with a cold forceps for histology.      A 8 mm polyp was found in the proximal rectum (benign-appearing lesion).       The polyp was flat and hyperplastic in appearance. The polyp was removed       with a cold snare. Resection and retrieval were complete. Estimated       blood loss was minimal.      A few medium-mouthed diverticula were found in the sigmoid colon.      No other significant abnormalities were identified in a careful       examination of the remainder of the colon.      The terminal ileum appeared normal.      The retroflexed view of the distal rectum and anal verge was  normal and       showed no anal or rectal abnormalities. Impression:               - One 3 mm polyp at the recto-sigmoid colon.                            Biopsied.                           - One benign appearing 8 mm polyp in the rectum,                            removed with a cold snare. Resected and retrieved.                           - Diverticulosis in the sigmoid colon.                           - The examined portion of the ileum was normal.                           - The distal rectum and anal verge are normal on                            retroflexion view. Moderate Sedation:      This patient was sedated with monitored anesthesia care, not moderate       sedation. Recommendation:           - Await pathology results.                           - If the pathology report reveals adenomatous                            tissue, then repeat the colonoscopy for                            surveillance in  5 years.                           - Resume Eliquis (apixaban) at prior dose today.                           - Resume previous diet. Procedure Code(s):        --- Professional ---                           [phone], Colonoscopy, flexible; with removal of                            tumor(s), polyp(s), or other lesion(s) by snare                            technique                           45380, 59, Colonoscopy, flexible; with biopsy,                            single or multiple Diagnosis Code(s):        --- Professional ---                           Z12.11, Encounter for screening for malignant                            neoplasm of colon                           D12.7, Benign neoplasm of rectosigmoid junction                           K62.1, Rectal polyp CPT copyright 2016 American Medical Association. All rights reserved. The codes documented in this report are preliminary and upon coder review may  be revised to meet current compliance requirements. Bernette Redbirdobert Anayia Eugene,  MD 11/29/2016 1:00:45 PM This report has been signed electronically. Number of Addenda: 0

## 2016-11-29 NOTE — Discharge Instructions (Addendum)
YOU MAY RESTART YOUR ELIQUIS AROUND 6 PM THIS EVENING.  CALL ME IF YOU SEE SIGNIFICANT RECTAL BLEEDING.   YOU HAD AN ENDOSCOPIC PROCEDURE TODAY: Refer to the procedure report and other information in the discharge instructions given to you for any specific questions about what was found during the examination. If this information does not answer your questions, please call [facility] office at [phone] to clarify.   YOU SHOULD EXPECT: Some feelings of bloating in the abdomen. Passage of more gas than usual. Walking can help get rid of the air that was put into your GI tract during the procedure and reduce the bloating. If you had a lower endoscopy (such as a colonoscopy or flexible sigmoidoscopy) you may notice spotting of blood in your stool or on the toilet paper. Some abdominal soreness may be present for a day or two, also.  DIET: Your first meal following the procedure should be a light meal and then it is ok to progress to your normal diet. A half-sandwich or bowl of soup is an example of a good first meal. Heavy or fried foods are harder to digest and may make you feel nauseous or bloated. Drink plenty of fluids but you should avoid alcoholic beverages for 24 hours. If you had a esophageal dilation, please see attached instructions for diet.   ACTIVITY: Your care partner should take you home directly after the procedure. You should plan to take it easy, moving slowly for the rest of the day. You can resume normal activity the day after the procedure however YOU SHOULD NOT DRIVE, use power tools, machinery or perform tasks that involve climbing or major physical exertion for 24 hours (because of the sedation medicines used during the test).   SYMPTOMS TO REPORT IMMEDIATELY: A gastroenterologist can be reached at any hour. Please call [phone]  for any of the following symptoms:  Following lower endoscopy (colonoscopy, flexible sigmoidoscopy) Excessive amounts of blood in the stool    Significant tenderness, worsening of abdominal pains  Swelling of the abdomen that is new, acute  Fever of 100 or higher  Following upper endoscopy (EGD, EUS, ERCP, esophageal dilation) Vomiting of blood or coffee ground material  New, significant abdominal pain  New, significant chest pain or pain under the shoulder blades  Painful or persistently difficult swallowing  New shortness of breath  Black, tarry-looking or red, bloody stools  FOLLOW UP:  If any biopsies were taken you will be contacted by phone or by letter within the next 1-3 weeks. Call [phone]  if you have not heard about the biopsies in 3 weeks.  Please also call with any specific questions about appointments or follow up tests.

## 2016-11-29 NOTE — H&P (Signed)
[name] is an 56 y.o. male.   Chief Complaint:  Need for colon cancer screening HPI: Pt w/ no worrisome risk factors or sx for initial screening colonoscopy.   Pt is on anticoagulation b/o h/o DVT/PE  Past Medical History:  Diagnosis Date  . Deep vein thrombosis (DVT) of left lower extremity (HCC)   . GERD (gastroesophageal reflux disease)   . History of chicken pox   . Hypertension   . Measles   . Pulmonary embolism (HCC)   . Sleep apnea   . Thyroid disease    Unfounded    Past Surgical History:  Procedure Laterality Date  . FOOT SURGERY     Chain saw accident    Family History  Problem Relation Age of Onset  . Hypertension Father     Living  . Heart disease Mother     Living  . Pulmonary embolism Mother   . Anemia Mother   . Hypertension Other     Paternal Side  . Hypertension Sister     x2  . Migraines Sister   . Allergies Son     x1   Social History:  reports that he has never smoked. He has never used smokeless tobacco. He reports that he does not drink alcohol or use drugs.  Allergies: No Known Allergies  Medications Prior to Admission  Medication Sig Dispense Refill  . apixaban (ELIQUIS) 5 MG TABS tablet Take 1 tablet (5 mg total) by mouth 2 (two) times daily. 60 tablet 6  . carvedilol (COREG) 12.5 MG tablet TAKE ONE (1) TABLET BY MOUTH AT BEDTIME 30 tablet 6  . furosemide (LASIX) 40 MG tablet TAKE ONE (1) TABLET BY MOUTH TWO (2) TIMES DAILY 60 tablet 6  . potassium chloride SA (K-DUR,KLOR-CON) 20 MEQ tablet Take 1 tablet (20 mEq total) by mouth daily. 30 tablet 6    No results found for this or any previous visit (from the past 48 hour(s)). No results found.  ROS no current acute sx  Blood pressure (!) 169/60, pulse 62, temperature 98.1 F (36.7 C), temperature source Oral, resp. rate 11, height 6\' 1"  (1.854 m), weight (!) 163.3 kg (360 lb), SpO2 99 %. Physical Exam  Morbidly obese, but otherwise healthy-appearing.  NAD. Chest has  diminished breath sds, heart reg rhythm, abd NT  Assessment/Plan Pt at "standard risk" for colon cancer, for colonoscopy today off anticoagulation x24 hr.  Florencia ReasonsBUCCINI,Ithan Touhey V, MD 11/29/2016, 11:26 AM

## 2016-11-30 ENCOUNTER — Encounter (HOSPITAL_COMMUNITY): Payer: Self-pay | Admitting: Gastroenterology

## 2017-01-25 ENCOUNTER — Other Ambulatory Visit (HOSPITAL_BASED_OUTPATIENT_CLINIC_OR_DEPARTMENT_OTHER): Payer: BLUE CROSS/BLUE SHIELD

## 2017-01-25 ENCOUNTER — Ambulatory Visit (HOSPITAL_BASED_OUTPATIENT_CLINIC_OR_DEPARTMENT_OTHER): Payer: BLUE CROSS/BLUE SHIELD | Admitting: Hematology & Oncology

## 2017-01-25 VITALS — BP 130/72 | HR 57 | Temp 98.0°F | Resp 20 | Wt 360.0 lb

## 2017-01-25 DIAGNOSIS — I82412 Acute embolism and thrombosis of left femoral vein: Secondary | ICD-10-CM

## 2017-01-25 DIAGNOSIS — I2699 Other pulmonary embolism without acute cor pulmonale: Secondary | ICD-10-CM

## 2017-01-25 DIAGNOSIS — I82402 Acute embolism and thrombosis of unspecified deep veins of left lower extremity: Secondary | ICD-10-CM | POA: Diagnosis not present

## 2017-01-25 LAB — COMPREHENSIVE METABOLIC PANEL
ALT: 24 U/L (ref 0–55)
AST: 19 U/L (ref 5–34)
Albumin: 3.7 g/dL (ref 3.5–5.0)
Alkaline Phosphatase: 85 U/L (ref 40–150)
Anion Gap: 7 mEq/L (ref 3–11)
BILIRUBIN TOTAL: 0.82 mg/dL (ref 0.20–1.20)
BUN: 19.6 mg/dL (ref 7.0–26.0)
CHLORIDE: 108 meq/L (ref 98–109)
CO2: 26 meq/L (ref 22–29)
Calcium: 9 mg/dL (ref 8.4–10.4)
Creatinine: 1 mg/dL (ref 0.7–1.3)
EGFR: 80 mL/min/{1.73_m2} — AB (ref >90)
GLUCOSE: 123 mg/dL (ref 70–140)
Potassium: 3.9 mEq/L (ref 3.5–5.1)
SODIUM: 140 meq/L (ref 136–145)
TOTAL PROTEIN: 6.6 g/dL (ref 6.4–8.3)

## 2017-01-25 LAB — CBC WITH DIFFERENTIAL (CANCER CENTER ONLY)
BASO#: 0 10*3/uL (ref 0.0–0.2)
BASO%: 0.5 % (ref 0.0–2.0)
EOS ABS: 0.4 10*3/uL (ref 0.0–0.5)
EOS%: 7.3 % — ABNORMAL HIGH (ref 0.0–7.0)
HCT: 41.7 % (ref 38.7–49.9)
HGB: 14.6 g/dL (ref 13.0–17.1)
LYMPH#: 2.3 10*3/uL (ref 0.9–3.3)
LYMPH%: 40 % (ref 14.0–48.0)
MCH: 32.8 pg (ref 28.0–33.4)
MCHC: 35 g/dL (ref 32.0–35.9)
MCV: 94 fL (ref 82–98)
MONO#: 0.6 10*3/uL (ref 0.1–0.9)
MONO%: 9.9 % (ref 0.0–13.0)
NEUT#: 2.5 10*3/uL (ref 1.5–6.5)
NEUT%: 42.3 % (ref 40.0–80.0)
PLATELETS: 183 10*3/uL (ref 145–400)
RBC: 4.45 10*6/uL (ref 4.20–5.70)
RDW: 12.2 % (ref 11.1–15.7)
WBC: 5.8 10*3/uL (ref 4.0–10.0)

## 2017-01-25 MED ORDER — APIXABAN 2.5 MG PO TABS: 2.5000 mg | ORAL_TABLET | Freq: Two times a day (BID) | ORAL | 12 refills | Status: DC

## 2017-01-25 NOTE — Progress Notes (Signed)
Hematology and Oncology Follow Up Visit  [name] [id number] July 17, 1961 56 y.o. 01/25/2017   Principle Diagnosis:   Idiopathic pulmonary embolism and left leg DVT  Current Therapy:    ELIQUIS 2.5 mg by mouth twice a day-to start in May 2018     Interim History:  [name] is back for follow-up. He is doing quite well. He had a Doppler done back in December. This was of his left leg. There is a residual non-occlusive thrombus in the popliteal vein.  He is still working. He is having no problems at work. He's had no leg swelling with the left leg.  He's had no cough or chest wall pain. He's had no fever. He's had no nausea or vomiting. He's had no change in bowel or bladder habits.  Overall, his performance status is ECOG 0.  Medications:  Current Outpatient Prescriptions:  .  apixaban (ELIQUIS) 2.5 MG TABS tablet, Take 1 tablet (2.5 mg total) by mouth 2 (two) times daily., Disp: 60 tablet, Rfl: 12 .  carvedilol (COREG) 12.5 MG tablet, TAKE ONE (1) TABLET BY MOUTH AT BEDTIME, Disp: 30 tablet, Rfl: 6 .  furosemide (LASIX) 40 MG tablet, TAKE ONE (1) TABLET BY MOUTH TWO (2) TIMES DAILY, Disp: 60 tablet, Rfl: 6 .  potassium chloride SA (K-DUR,KLOR-CON) 20 MEQ tablet, Take 1 tablet (20 mEq total) by mouth daily., Disp: 30 tablet, Rfl: 6  Allergies: No Known Allergies  Past Medical History, Surgical history, Social history, and Family History were reviewed and updated.  Review of Systems: As above  Physical Exam:  weight is 360 lb (163.3 kg) (abnormal). His oral temperature is 98 F (36.7 C). His blood pressure is 130/72 and his pulse is 57 (abnormal). His respiration is 20 and oxygen saturation is 98%.   Wt Readings from Last 3 Encounters:  01/25/17 (!) 360 lb (163.3 kg)  11/29/16 (!) 360 lb (163.3 kg)  08/15/16 (!) 360 lb (163.3 kg)     Obese white male in no obvious distress. Head neck exam shows no ocular or oral lesions. There are no palpable cervical or  supraclavicular lymph nodes. Lungs are clear. Cardiac exam regular rate and rhythm with no murmurs, rubs or bruits. Abdomen is soft. He is obese. Has good bowel sounds. There is no fluid wave. There is no palpable liver or spleen tip. Back exam shows no tenderness over the spine, ribs or hips. Extremities shows mild nonpitting edema of the left leg. No venous cord is noted. Has good pulses in his distal extremities. Skin exam shows no rashes, ecchymoses or petechia. Neurological exam shows no focal neurological deficits.  Lab Results  Component Value Date   WBC 5.8 01/25/2017   HGB 14.6 01/25/2017   HCT 41.7 01/25/2017   MCV 94 01/25/2017   PLT 183 01/25/2017     Chemistry      Component Value Date/Time   NA 140 01/25/2017 0958   K 3.9 01/25/2017 0958   CL 105 04/24/2016 1519   CL 103 04/11/2016 1505   CL 102 02/24/2016 1312   CO2 26 01/25/2017 0958   BUN 19.6 01/25/2017 0958   CREATININE 1.0 01/25/2017 0958      Component Value Date/Time   CALCIUM 9.0 01/25/2017 0958   ALKPHOS 85 01/25/2017 0958   AST 19 01/25/2017 0958   ALT 24 01/25/2017 0958   BILITOT 0.82 01/25/2017 0958         Impression and Plan: [name] is a 56 year old white male.  He has an idiopathic pulmonary embolus and left lower extremity thrombus.  We will go ahead and put him on low-dose maintenance ELIQUIS for one year. I think this would be reasonable. I think this would be very helpful.  We will get him on 2.5 mg twice a day.  I will plan to see him back in another 4 months.  I don't think we have to do any Dopplers or CT scans.  He is very happy that he go down to a lower dose.  I'm sure he will be very busy getting his tomatoes planted. I think he plants over 200 plants.Josph Macho, MD 4/13/20181:14 PM

## 2017-01-28 LAB — D-DIMER, QUANTITATIVE: D-DIMER: 0.57 mg/L FEU — ABNORMAL HIGH (ref 0.00–0.49)

## 2017-03-15 NOTE — Addendum Note (Signed)
Addendum  created 03/15/17 1129 by Donevan Biller, Macguire, MD   Sign clinical note    

## 2017-04-26 ENCOUNTER — Encounter: Payer: BLUE CROSS/BLUE SHIELD | Admitting: Physician Assistant

## 2017-05-16 ENCOUNTER — Telehealth: Payer: Self-pay | Admitting: Emergency Medicine

## 2017-05-16 ENCOUNTER — Other Ambulatory Visit: Payer: Self-pay | Admitting: Physician Assistant

## 2017-05-16 NOTE — Telephone Encounter (Signed)
Patient request a refill of the Carvedilol medication. He is past due for a CPE. Last CPE was 04/24/16.  LMOVM advising patient he is due for an appointment. Will send in a limited amount of Carvedilol to the pharmacy until appointment.

## 2017-05-20 ENCOUNTER — Other Ambulatory Visit: Payer: Self-pay | Admitting: Physician Assistant

## 2017-05-20 NOTE — Telephone Encounter (Signed)
Rx refill. LB 

## 2017-05-27 ENCOUNTER — Telehealth: Payer: Self-pay | Admitting: Physician Assistant

## 2017-05-27 NOTE — Telephone Encounter (Signed)
Ok with me 

## 2017-05-27 NOTE — Telephone Encounter (Signed)
ok 

## 2017-05-27 NOTE — Telephone Encounter (Signed)
[facility], please advise.  Relation to WU:JWJXpt:self Call back number:[phone] Pharmacy: [facility] - [place] - [address] [address] [phone] (Phone) [phone] (Fax)     Reason for call:  Patient requesting a refill furosemide (LASIX) 40 MG tablet,please advise

## 2017-05-27 NOTE — Telephone Encounter (Signed)
Patient scheduled for 06/19/17 with Dr. Patsy Lageropland

## 2017-05-31 ENCOUNTER — Ambulatory Visit (HOSPITAL_BASED_OUTPATIENT_CLINIC_OR_DEPARTMENT_OTHER): Payer: BLUE CROSS/BLUE SHIELD | Admitting: Hematology & Oncology

## 2017-05-31 ENCOUNTER — Other Ambulatory Visit (HOSPITAL_BASED_OUTPATIENT_CLINIC_OR_DEPARTMENT_OTHER): Payer: BLUE CROSS/BLUE SHIELD

## 2017-05-31 VITALS — BP 148/86 | HR 73 | Temp 97.9°F | Resp 16 | Wt 355.0 lb

## 2017-05-31 DIAGNOSIS — I82402 Acute embolism and thrombosis of unspecified deep veins of left lower extremity: Secondary | ICD-10-CM

## 2017-05-31 DIAGNOSIS — I2699 Other pulmonary embolism without acute cor pulmonale: Secondary | ICD-10-CM | POA: Diagnosis not present

## 2017-05-31 DIAGNOSIS — I82412 Acute embolism and thrombosis of left femoral vein: Secondary | ICD-10-CM

## 2017-05-31 DIAGNOSIS — I82432 Acute embolism and thrombosis of left popliteal vein: Secondary | ICD-10-CM

## 2017-05-31 LAB — CBC WITH DIFFERENTIAL (CANCER CENTER ONLY)
BASO#: 0 10*3/uL (ref 0.0–0.2)
BASO%: 0.3 % (ref 0.0–2.0)
EOS ABS: 0.4 10*3/uL (ref 0.0–0.5)
EOS%: 6 % (ref 0.0–7.0)
HCT: 43.7 % (ref 38.7–49.9)
HGB: 15.6 g/dL (ref 13.0–17.1)
LYMPH#: 2.4 10*3/uL (ref 0.9–3.3)
LYMPH%: 33.8 % (ref 14.0–48.0)
MCH: 33.5 pg — AB (ref 28.0–33.4)
MCHC: 35.7 g/dL (ref 32.0–35.9)
MCV: 94 fL (ref 82–98)
MONO#: 0.7 10*3/uL (ref 0.1–0.9)
MONO%: 9.7 % (ref 0.0–13.0)
NEUT#: 3.6 10*3/uL (ref 1.5–6.5)
NEUT%: 50.2 % (ref 40.0–80.0)
PLATELETS: 212 10*3/uL (ref 145–400)
RBC: 4.66 10*6/uL (ref 4.20–5.70)
RDW: 12.1 % (ref 11.1–15.7)
WBC: 7.2 10*3/uL (ref 4.0–10.0)

## 2017-05-31 LAB — CMP (CANCER CENTER ONLY)
ALT(SGPT): 34 U/L (ref 10–47)
AST: 28 U/L (ref 11–38)
Albumin: 4.1 g/dL (ref 3.3–5.5)
Alkaline Phosphatase: 91 U/L — ABNORMAL HIGH (ref 26–84)
BUN: 17 mg/dL (ref 7–22)
CO2: 32 meq/L (ref 18–33)
CREATININE: 1.2 mg/dL (ref 0.6–1.2)
Calcium: 9.6 mg/dL (ref 8.0–10.3)
Chloride: 104 mEq/L (ref 98–108)
GLUCOSE: 122 mg/dL — AB (ref 73–118)
Potassium: 3.9 mEq/L (ref 3.3–4.7)
SODIUM: 143 meq/L (ref 128–145)
TOTAL PROTEIN: 7.6 g/dL (ref 6.4–8.1)
Total Bilirubin: 0.9 mg/dl (ref 0.20–1.60)

## 2017-05-31 NOTE — Progress Notes (Signed)
Hematology and Oncology Follow Up Visit  [name] [id number] 06-Jul-1961 55 y.o. 05/31/2017   Principle Diagnosis:   Idiopathic pulmonary embolism and left leg DVT  Current Therapy:    ELIQUIS 2.5 mg by mouth twice a day-to start in May 2018 - 1 yr of     therapy     Interim History:  [name] is back for follow-up. He is doing quite well. He has had a fairly busy summer. He is still working full-time for [employer]. however, several other might be some layoffs. Hopefully he will not be laid off.  He has a wonderful garden. He brought in some vegetables for [place]. I am very thankful for this.   He is doing well with ELIQUIS. He's had no problems with bleeding. There's been no leg pain. He says, on occasion, his left leg is a little bit tight.  He's had no change in bowel or bladder habits. He's had no cough or shortness of breath. There's been no chest wall pain.  Overall, his performance status is ECOG 0.  Medications:  Current Outpatient Prescriptions:  .  apixaban (ELIQUIS) 2.5 MG TABS tablet, Take 1 tablet (2.5 mg total) by mouth 2 (two) times daily., Disp: 60 tablet, Rfl: 12 .  carvedilol (COREG) 12.5 MG tablet, TAKE 1 TABLET BY MOUTH EACH NIGHT AT BEDTIME, Disp: 30 tablet, Rfl: 0 .  furosemide (LASIX) 40 MG tablet, TAKE 1 TABLET BY MOUTH TWICE DAILY, Disp: 30 tablet, Rfl: 0 .  potassium chloride SA (K-DUR,KLOR-CON) 20 MEQ tablet, Take 1 tablet (20 mEq total) by mouth daily., Disp: 30 tablet, Rfl: 6  Allergies: No Known Allergies  Past Medical History, Surgical history, Social history, and Family History were reviewed and updated.  Review of Systems: As above  Physical Exam:  weight is 355 lb (161 kg) (abnormal). His oral temperature is 97.9 F (36.6 C). His blood pressure is 148/86 (abnormal) and his pulse is 73. His respiration is 16 and oxygen saturation is 99%.   Wt Readings from Last 3 Encounters:  05/31/17 (!) 355 lb (161 kg)  01/25/17 (!) 360  lb (163.3 kg)  11/29/16 (!) 360 lb (163.3 kg)     Obese white male in no obvious distress. Head neck exam shows no ocular or oral lesions. There are no palpable cervical or supraclavicular lymph nodes. Lungs are clear. Cardiac exam regular rate and rhythm with no murmurs, rubs or bruits. Abdomen is soft. He is obese. Has good bowel sounds. There is no fluid wave. There is no palpable liver or spleen tip. Back exam shows no tenderness over the spine, ribs or hips. Extremities shows mild nonpitting edema of the left leg. No venous cord is noted. Has good pulses in his distal extremities. Skin exam shows no rashes, ecchymoses or petechia. Neurological exam shows no focal neurological deficits.  Lab Results  Component Value Date   WBC 7.2 05/31/2017   HGB 15.6 05/31/2017   HCT 43.7 05/31/2017   MCV 94 05/31/2017   PLT 212 05/31/2017     Chemistry      Component Value Date/Time   NA 143 05/31/2017 1502   NA 140 01/25/2017 0958   K 3.9 05/31/2017 1502   K 3.9 01/25/2017 0958   CL 104 05/31/2017 1502   CO2 32 05/31/2017 1502   CO2 26 01/25/2017 0958   BUN 17 05/31/2017 1502   BUN 19.6 01/25/2017 0958   CREATININE 1.2 05/31/2017 1502   CREATININE 1.0 01/25/2017  0141      Component Value Date/Time   CALCIUM 9.6 05/31/2017 1502   CALCIUM 9.0 01/25/2017 0958   ALKPHOS 91 (H) 05/31/2017 1502   ALKPHOS 85 01/25/2017 0958   AST 28 05/31/2017 1502   AST 19 01/25/2017 0958   ALT 34 05/31/2017 1502   ALT 24 01/25/2017 0958   BILITOT 0.90 05/31/2017 1502   BILITOT 0.82 01/25/2017 0958         Impression and Plan: [name] is a 56 year old white male. He has an idiopathic pulmonary embolus and left lower extremity thrombus.  He is on low-dose maintenance ELIQUIS. I will keep him on this for one year. He will finish up 1 year in May 2019.  I might consider another Doppler of his left leg prior to stopping the ELIQUIS.  I would like to see him back in 6 months.  He is doing  good job is staying well-hydrated.  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   Josph Macho, MD 8/17/20183:55 PM

## 2017-06-01 ENCOUNTER — Other Ambulatory Visit: Payer: Self-pay | Admitting: Physician Assistant

## 2017-06-01 LAB — D-DIMER, QUANTITATIVE: D-DIMER: 0.93 mg/L FEU — ABNORMAL HIGH (ref 0.00–0.49)

## 2017-06-03 ENCOUNTER — Other Ambulatory Visit: Payer: Self-pay | Admitting: Physician Assistant

## 2017-06-17 IMAGING — CT CT ANGIO CHEST
2 of 6 series · 17 of 36 positions shown · IV contrast (isovue)
Comparison: Chest x-ray 02/04/2016

CLINICAL DATA: Evaluate for pulmonary embolus ; Pt c/o SOB with
exertion and at rest x 2 days, also c/o left leg swelling x 1 week ,
today left calf pain Hx of HTN

EXAM:
CT ANGIOGRAPHY CHEST WITH CONTRAST
TECHNIQUE: Multidetector CT imaging of the chest was performed using the
standard protocol during bolus administration of intravenous
contrast. Multiplanar CT image reconstructions and MIPs were
obtained to evaluate the vascular anatomy.
CONTRAST:  100 cc Isovue 370

[Series 5: coronal mpr · coronal · 0.56mm/px · 1 of 171 slices shown]
[im 86/171  mediastinal]
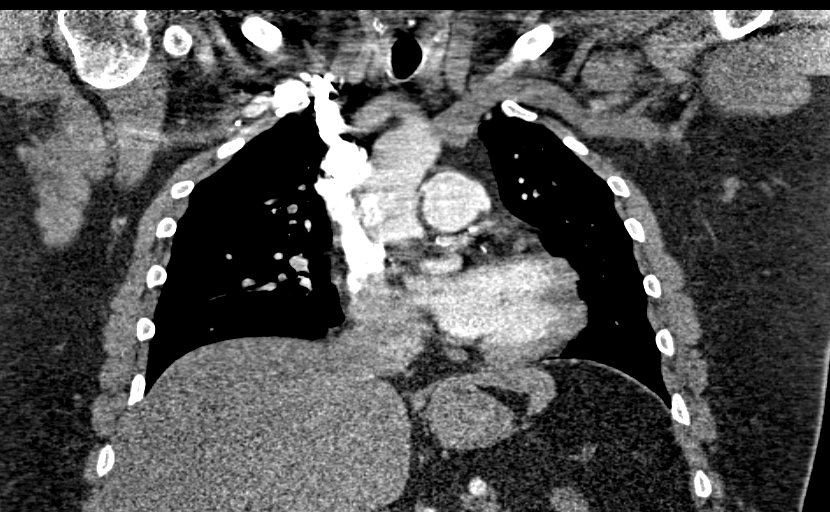

[Series 10: thins for pacs · axial · 0.90mm/px · z∈[+1268,+1520]mm · 16 of 279 slices shown]
[im 14/279  lung]
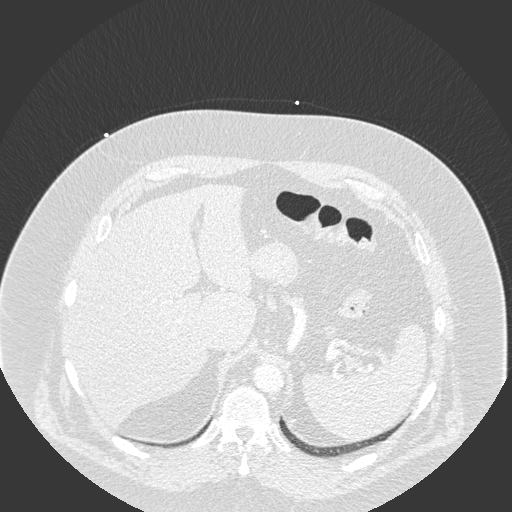
[im 28/279  mediastinal]
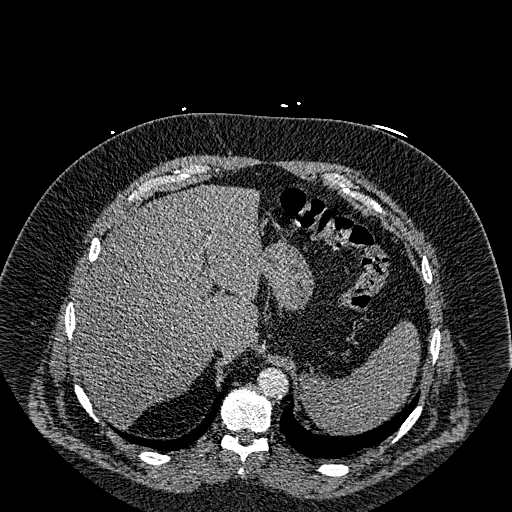
[im 42/279  lung]
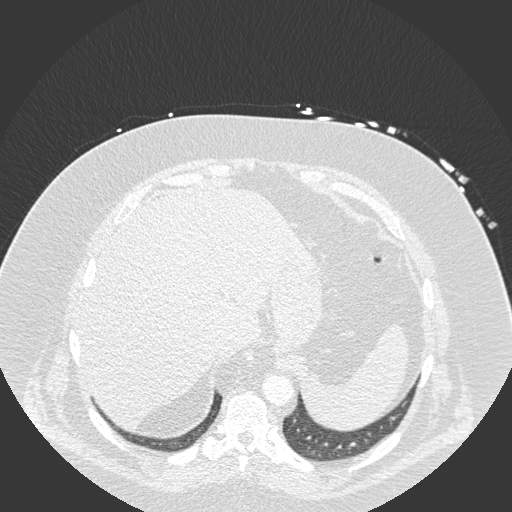
[im 70/279  mediastinal]
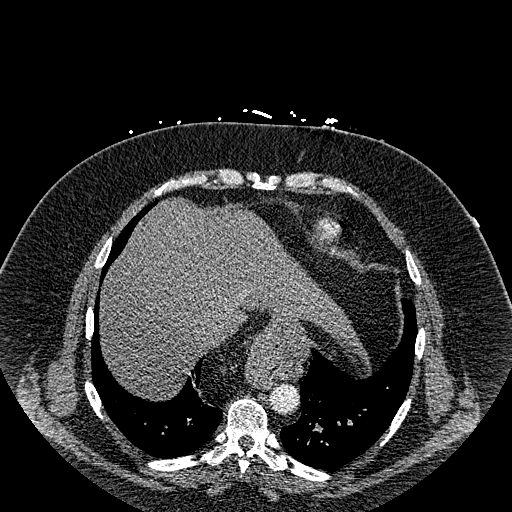
[im 84/279  lung]
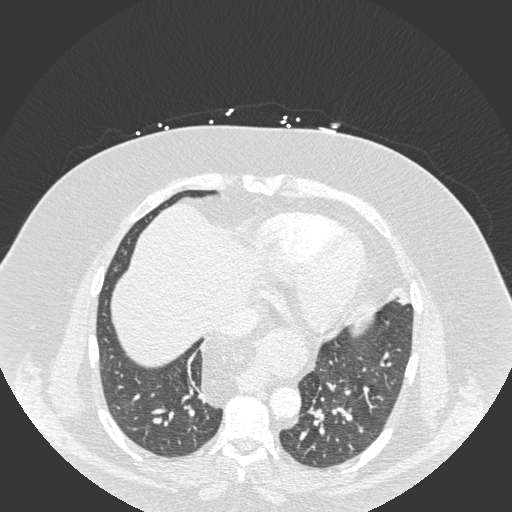
[im 98/279  mediastinal]
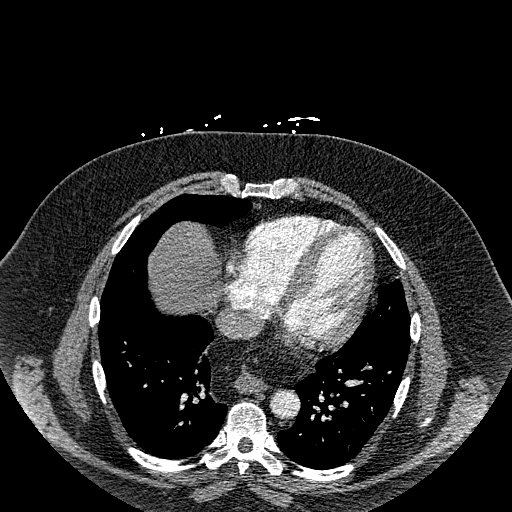
[im 112/279  lung]
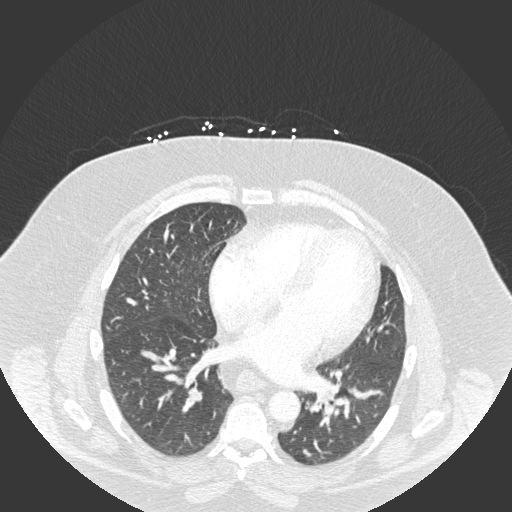
[im 126/279  mediastinal]
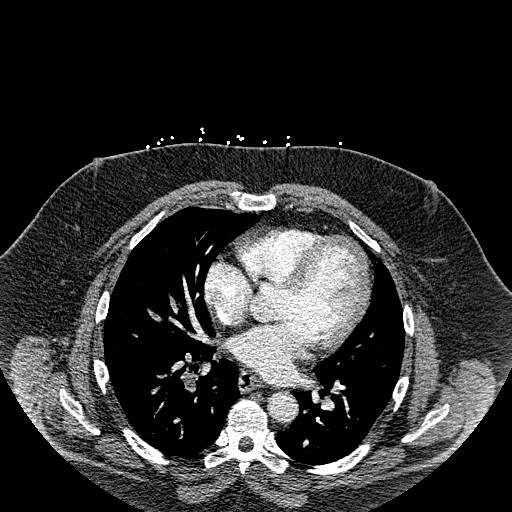
[im 153/279  lung]
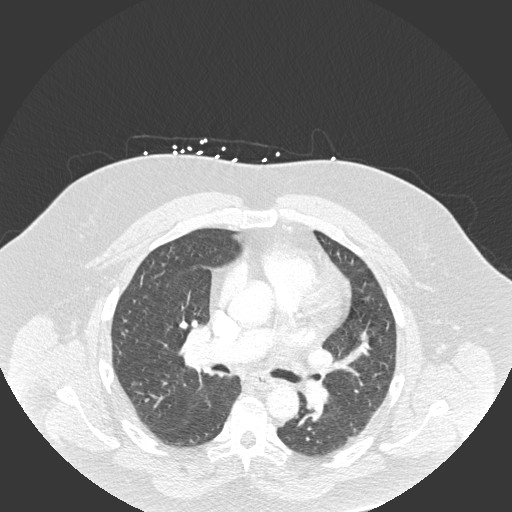
[im 167/279  mediastinal]
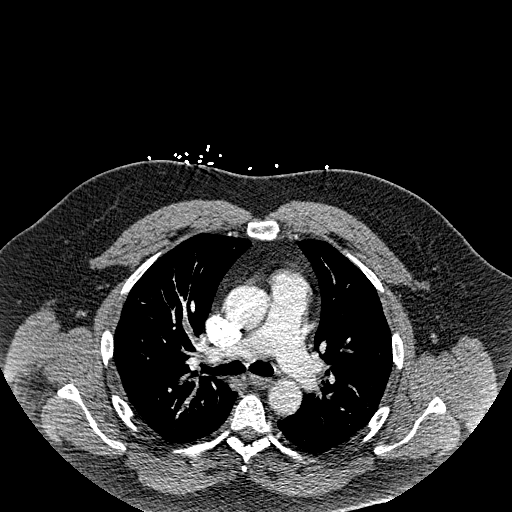
[im 181/279  lung]
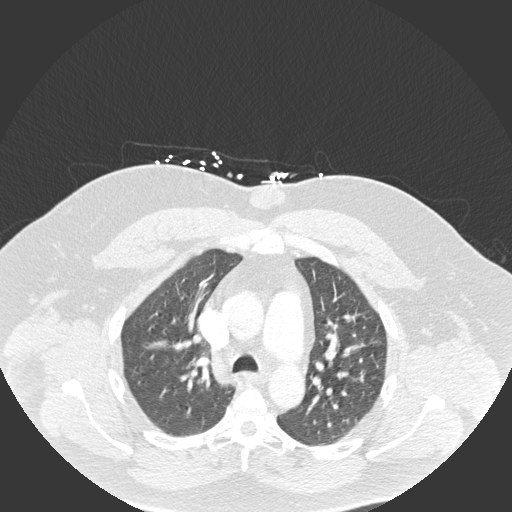
[im 195/279  mediastinal]
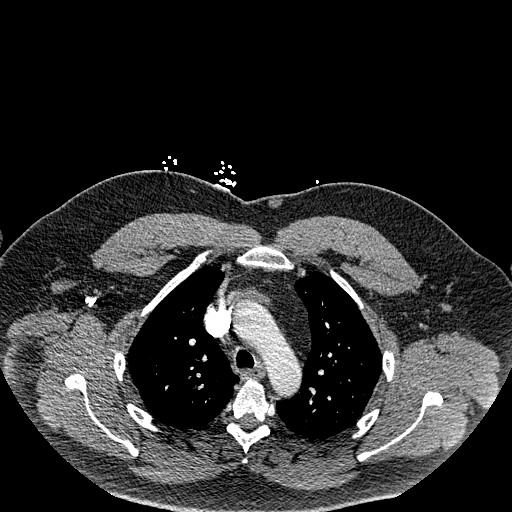
[im 209/279  lung]
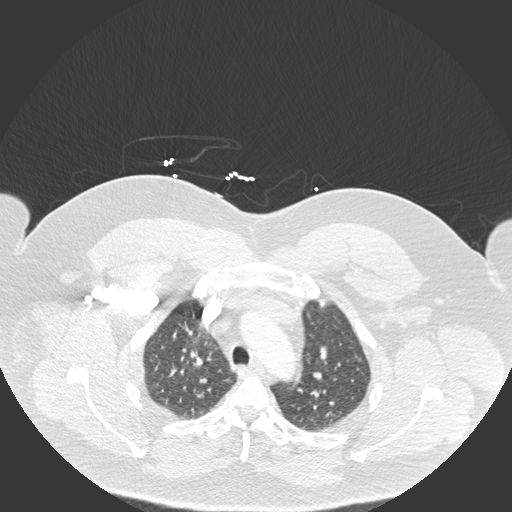
[im 237/279  mediastinal]
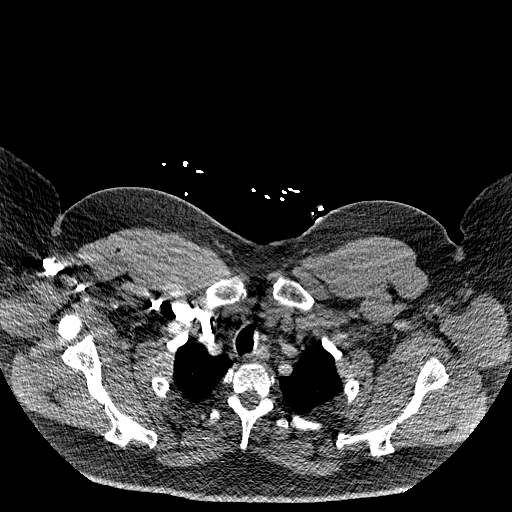
[im 251/279  lung]
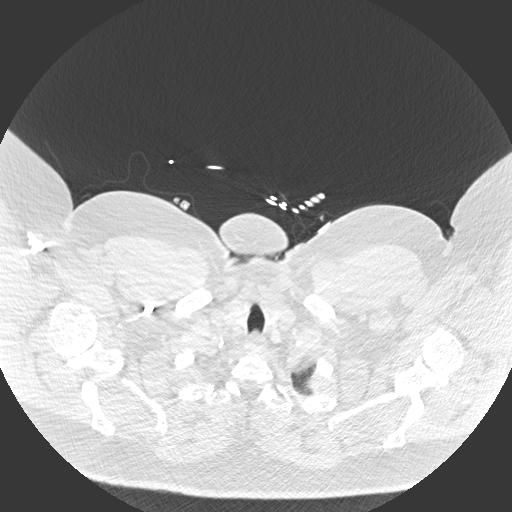
[im 265/279  mediastinal]
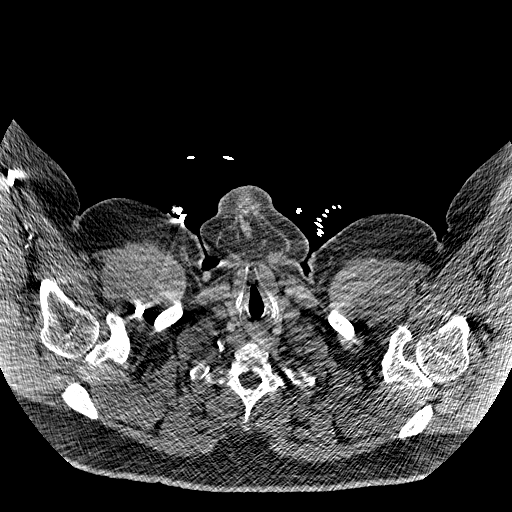

[17 of 36 positions shown; findings below may reference images not displayed]

FINDINGS: Heart: Overall heart size is upper normal. The RV/LV ratio is 1.1.
No pericardial effusion.

Vascular structures: Pulmonary arteries are only moderately well
opacified, limiting evaluation of small pulmonary arteries. However
there is significant thrombus in the right upper lobe and right
lower lobe pulmonary artery branches. Although no definite emboli
are identified in the left pulmonary artery system, smaller emboli
are difficult to exclude.

Mediastinum/thyroid: The visualized portion of the thyroid gland has
a normal appearance. Moderate hiatal hernia is present.

Lungs/Airways: In the right middle lobe there is a 4 x 4 mm nodule
(for mm mean diameter) on image 49 of series 11. No focal
consolidation or pleural effusion identified. No pulmonary edema.

Upper abdomen: Hiatal hernia; otherwise unremarkable.

Chest wall/osseous structures: Within the midline of the upper
anterior chest there is a rounded soft tissue density mass measuring
3.4 x 2.2 cm anterior to the sternomanubrial junction. Visualized
osseous structures have a normal appearance.

Review of the MIP images confirms the above findings.
IMPRESSION: 1. Positive for acute PE with CT evidence of right heart strain
(RV/LV Ratio = 1.1) consistent with at least submassive
(intermediate risk) PE. The presence of right heart strain has been
associated with an increased risk of morbidity and mortality. Please
activate Code PE by paging [phone].
2. Right middle lobe nodule. No follow-up needed if patient is
low-risk. Non-contrast chest CT can be considered in 12 months if
patient is high-risk. This recommendation follows the consensus
statement: Guidelines for Management of Incidental Pulmonary Nodules
Detected on CT Images:From the [HOSPITAL] 7772; published
online before print (10.1148/radiol.8273737396).
3. Rounded soft tissue density mass in the upper central anterior
chest, anterior to the sternomanubrial junction. Findings could be
related to small epidermal inclusion cyst or small solid mass.
Correlation with physical exam findings recommended.

Critical Value/emergent results were called by telephone at the time
of interpretation on 02/04/2016 at [DATE] to Dr. ARGILIO SAILOR ,
who verbally acknowledged these results.

## 2017-06-17 IMAGING — CR DG CHEST 2V
2 series · 2 of 2 positions shown · non-contrast
Comparison: None available

CLINICAL DATA: SOB x 2 weeks, worsening x 2-3 days ago, dry cough x
1 week HTN, never smoker No sx

EXAM:
CHEST - 2 VIEW

[w chest pa]
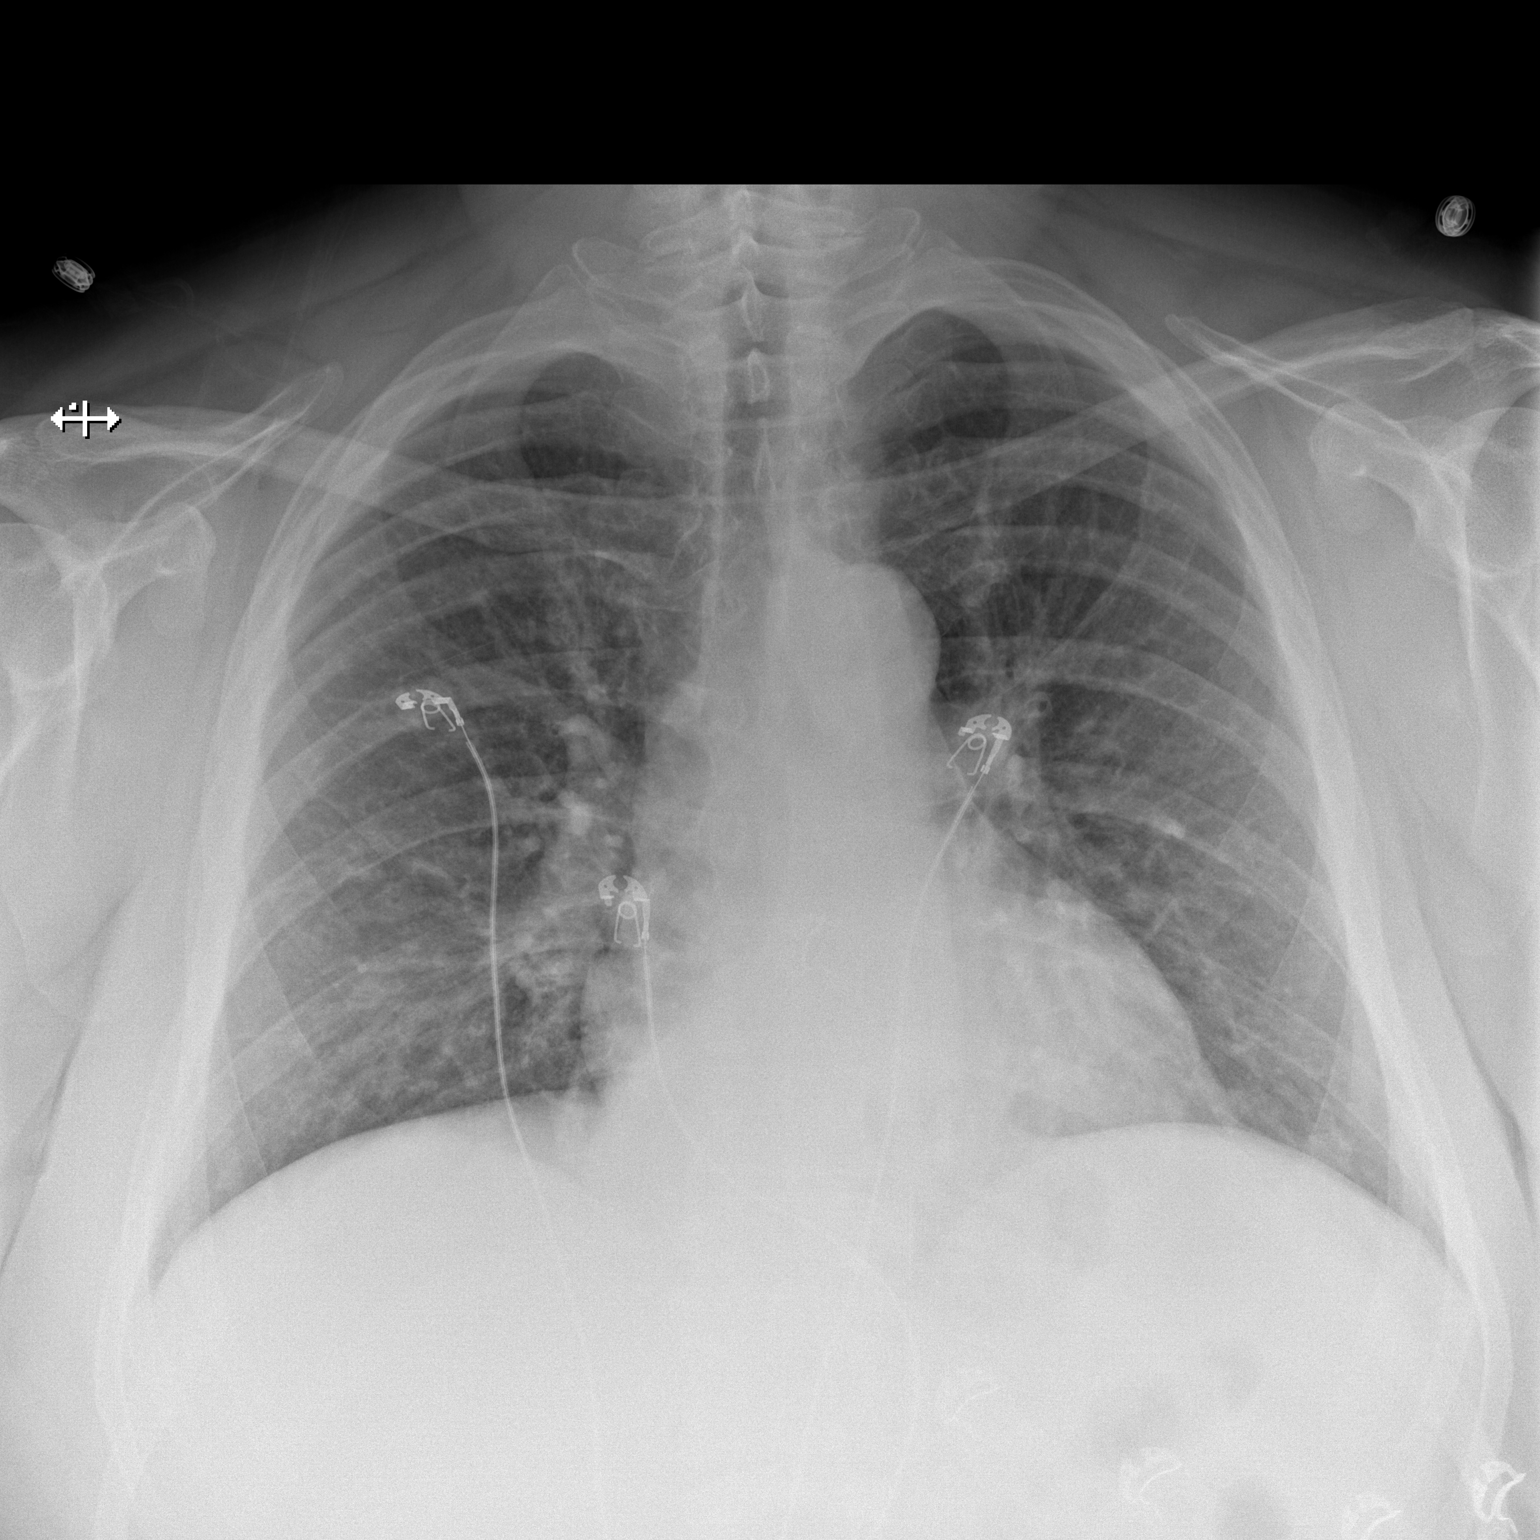

[w chest lat]
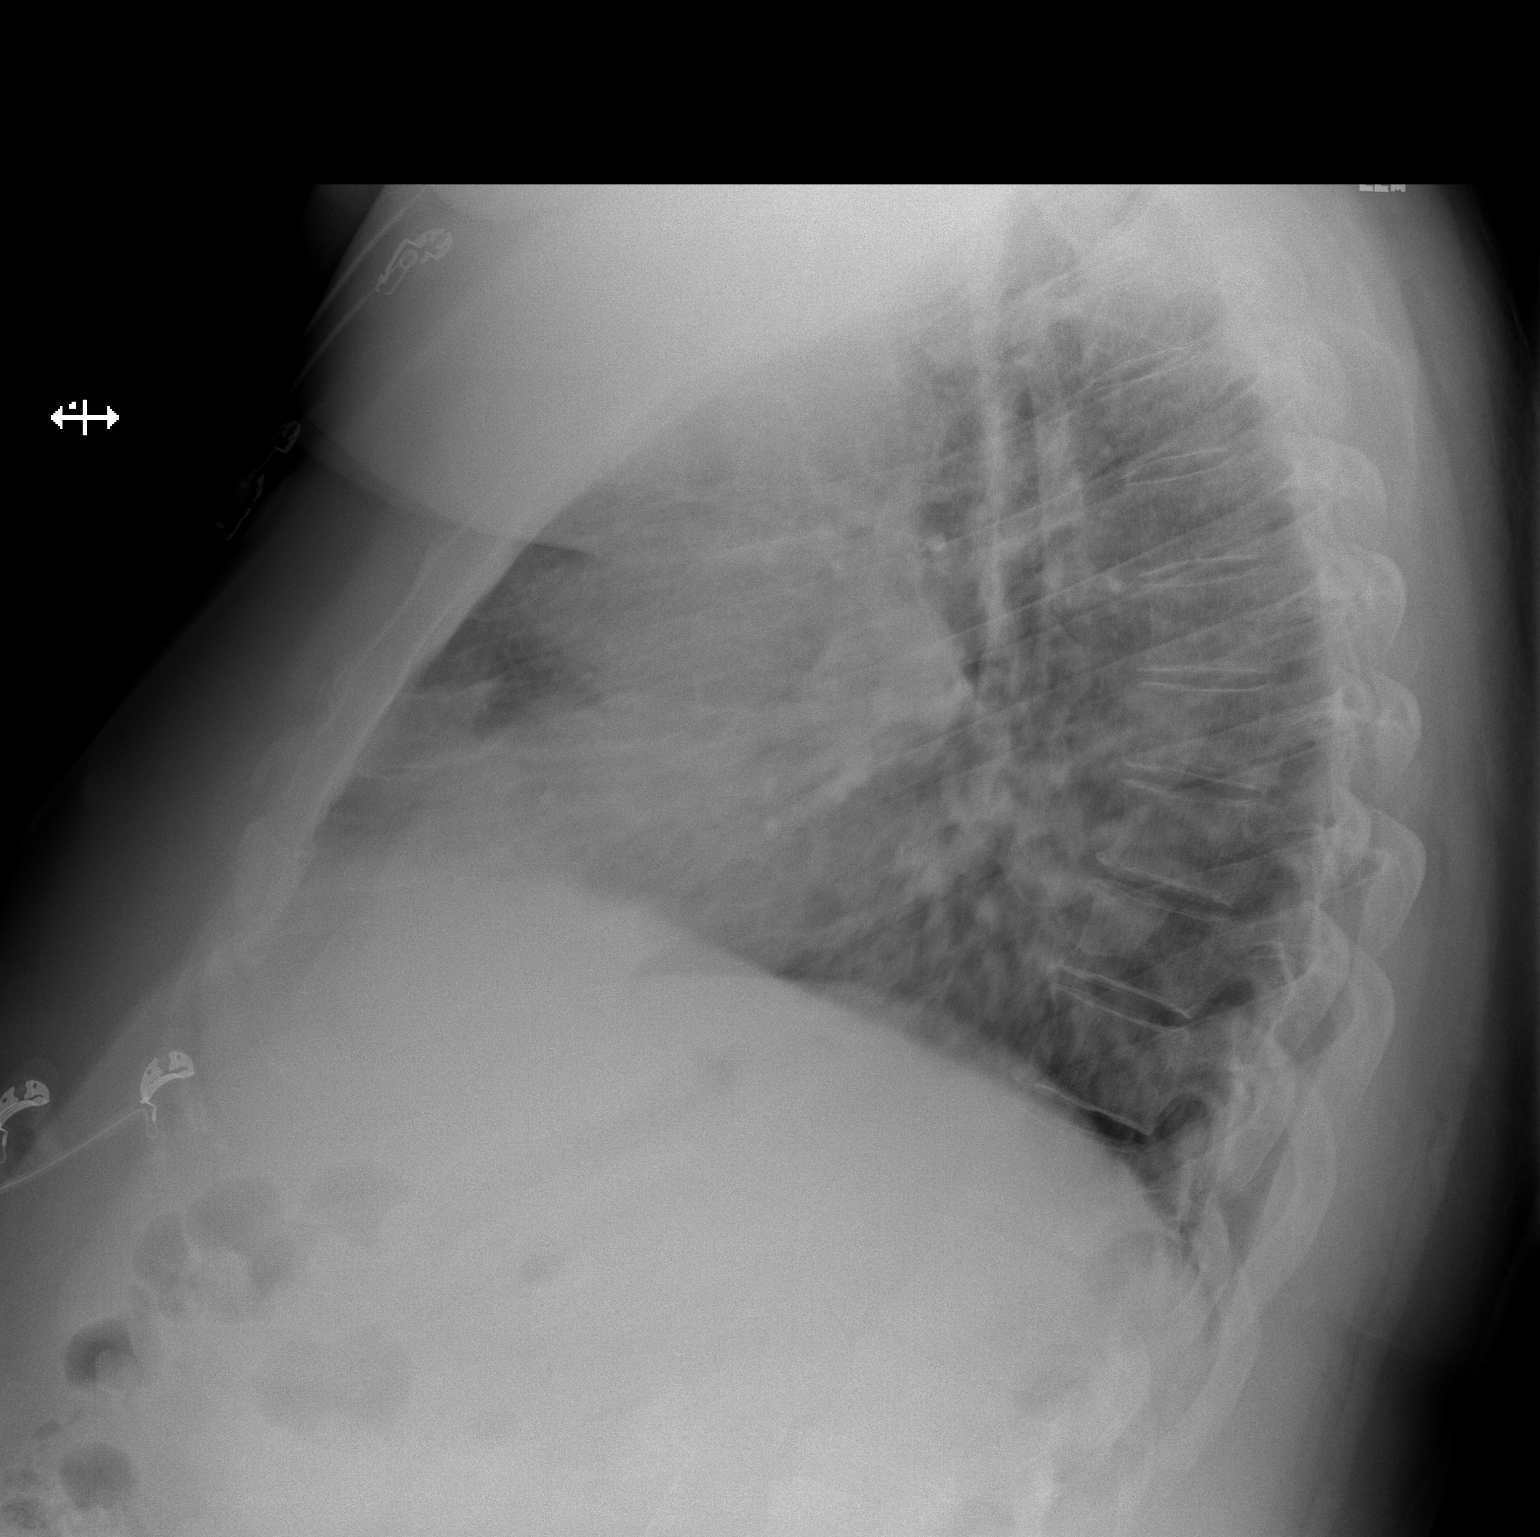

[2 of 2 positions shown; findings below may reference images not displayed]

FINDINGS: Relatively low lung volumes with some crowding of perihilar
bronchovascular structures. No focal infiltrate or overt edema.
Motion degrades the lateral radiograph. Heart size upper limits
normal.
No effusion.  No pneumothorax.
Visualized skeletal structures are unremarkable.
IMPRESSION: Low volumes.  No acute disease.

## 2017-06-17 NOTE — Progress Notes (Addendum)
[facility] [address] [place], [place] [address] [phone]  Date:  06/19/2017   Name:  [name]   DOB:  Oct 09, 1961   MRN:  [id number]  PCP:  [name] Merl, PA-C    Chief Complaint: Transfer of Care (Heel pain in left foot)   History of Present Illness:  [name] is a 56 y.o. very pleasant male patient who presents with the following:  Last seen by [name] about a year ago- is transferring care to my practice as [name] has gone to a new location.  From note a year ago:  Chronic Issues: Hypertension -- Endorses taking medications as directed. Patient denies chest pain, palpitations, lightheadedness, dizziness, vision changes or frequent headaches.     BP Readings from Last 3 Encounters:  04/24/16 138/88  04/11/16 140/71  02/29/16 136/90   Hypothyroidism -- Is taking levothyroxine as directed. Is due for repeat TSH levels.  Health Maintenance: Immunizations -- due for TDaP           Colonoscopy -- Due for screening colonoscopy. Average risk. Asymptomatic.  He had been seeing Dr. Myna Cross for a DVT/ PE and should have finished up his eliquis this spring (1 year of anticoagulation)  [name] is a 56 year old white male. He has an idiopathic pulmonary embolus and left lower extremity thrombus. I still feel that 1 year of anticoagulation is necessary. He will finish up in April 2018. After that, we will plan for maintenance anticoagulation at half dose. However per more recent notes it appears that [place] had another vs residual DVT and they are planning to continue anticoagulation until next year  He is using eliquis 2.5 BID at this time  Lab Results  Component Value Date   PSA 0.23 04/24/2016   He notes today that his left foot is painful, and his left leg seems more stiff than the right The worst pain is under his heel- he wonders about plantar fascitis   He was doing a lot of chores  around the house today- after he sat down to rest and then stood up again, the heel pain was more pronounced  It bothers him most when he has been off his feet for a time - in the morning as well Lab Results  Component Value Date   TSH 3.25 04/24/2016   Pt reports that he is not aware of having any thyroid issues, and he is not taking a thyroid med.  We will check this for him today. Looking back her was given synthroid in 29528, but none since and last TSH was normal   He declines a flu shot today He will check on his tetanus shot with his wife and will boost if due at next visit   Patient Active Problem List   Diagnosis Date Noted  . Prostate cancer screening 04/29/2016  . Visit for preventive health examination 04/29/2016  . Colon cancer screening 04/29/2016  . Acute deep vein thrombosis (DVT) of left lower extremity (HCC) 02/05/2016  . Hypothyroidism 02/05/2016  . Bilateral edema of lower extremity 02/05/2016  . Acute pulmonary embolism (HCC) 02/04/2016  . Obstructive sleep apnea on CPAP 02/04/2016  . Lung nodule 02/04/2016  . Morbid obesity (HCC) 02/04/2016  . Essential hypertension 02/04/2016    Past Medical History:  Diagnosis Date  . Deep vein thrombosis (DVT) of left lower extremity (HCC)   . GERD (gastroesophageal reflux disease)   .  History of chicken pox   . Hypertension   . Measles   . Pulmonary embolism (HCC)   . Sleep apnea   . Thyroid disease    Unfounded    Past Surgical History:  Procedure Laterality Date  . COLONOSCOPY WITH PROPOFOL N/A 11/29/2016   Procedure: COLONOSCOPY WITH PROPOFOL;  Surgeon: [name] Redbird, MD;  Location: [facility];  Service: Endoscopy;  Laterality: N/A;  . FOOT SURGERY     Chain saw accident    Social History  Substance Use Topics  . Smoking status: Never Smoker  . Smokeless tobacco: Never Used  . Alcohol use No    Family History  Problem Relation Age of Onset  . Hypertension Father        Living  . Heart disease  Mother        Living  . Pulmonary embolism Mother   . Anemia Mother   . Hypertension Other        Paternal Side  . Hypertension Sister        x2  . Migraines Sister   . Allergies Son        x1    No Known Allergies  Medication list has been reviewed and updated.  Current Outpatient Prescriptions on File Prior to Visit  Medication Sig Dispense Refill  . apixaban (ELIQUIS) 2.5 MG TABS tablet Take 1 tablet (2.5 mg total) by mouth 2 (two) times daily. 60 tablet 12  . carvedilol (COREG) 12.5 MG tablet TAKE 1 TABLET BY MOUTH EVERY NIGHT AT BEDTIME 30 tablet 0  . furosemide (LASIX) 40 MG tablet TAKE 1 TABLET BY MOUTH TWICE DAILY 60 tablet 0  . potassium chloride SA (K-DUR,KLOR-CON) 20 MEQ tablet Take 1 tablet (20 mEq total) by mouth daily. 30 tablet 6   No current facility-administered medications on file prior to visit.     Review of Systems:  As per HPI- otherwise negative. No fever or chills No CP or SOB No nausea, vomiting or diarrhea    Physical Examination: Vitals:   06/19/17 1512 06/19/17 1516  BP: (!) 147/91 131/88  Pulse: 81   Temp: 98.1 F (36.7 C)   SpO2: 96%    Vitals:   06/19/17 1512  Weight: (!) 355 lb 9.6 oz (161.3 kg)  Height: 6\' 1"  (1.854 m)   Body mass index is 46.92 kg/m. Ideal Body Weight: Weight in (lb) to have BMI = 25: 189.1  GEN: WDWN, NAD, Non-toxic, A & O x 3, obese, otherwise looks well today  HEENT: Atraumatic, Normocephalic. Neck supple. No masses, No LAD.  Bilateral TM wnl, oropharynx normal.  PEERL,EOMI.   Ears and Nose: No external deformity. CV: RRR, No M/G/R. No JVD. No thrill. No extra heart sounds. PULM: CTA B, no wheezes, crackles, rhonchi. No retractions. No resp. distress. No accessory muscle use. EXTR: No c/c trace edema of legs, left more than right.  Calves are non tender, no pitting noted  NEURO Normal gait.  PSYCH: Normally interactive. Conversant. Not depressed or anxious appearing.  Calm demeanor.  Left foot: he is  tender over the plantar fascia insertion at the heel Scar on foot from chainsaw injury 40 years ago Normal circulation of foot   Assessment and Plan: Essential hypertension  Bilateral edema of lower extremity  Plantar fasciitis  Screening for prostate cancer - Plan: PSA  Abnormal TSH - Plan: TSH  Encounter for hepatitis C screening test for low risk patient - Plan: Hepatitis C antibody  Here today for a  recheck visit BP under ok control  Labs pending as above Discussed conservative treatment for plantar fascitis, he will let me know if he desires a sports med referral  Will plan further follow- up pending labs. Plan to recheck in 6 months   Signed Abbe AmsterdamJessica Abuk Selleck, MD  Received labs 9/7  Results for orders placed or performed in visit on 06/19/17  TSH  Result Value Ref Range   TSH 3.98 0.35 - 4.50 uIU/mL  PSA  Result Value Ref Range   PSA 0.19 0.10 - 4.00 ng/mL  Hepatitis C antibody  Result Value Ref Range   Hepatitis C Ab NON-REACTIVE NON-REACTI   SIGNAL TO CUT-OFF 0.01 <1.00    Lab Results  Component Value Date   PSA 0.19 06/19/2017   PSA 0.23 04/24/2016

## 2017-06-18 ENCOUNTER — Other Ambulatory Visit: Payer: Self-pay | Admitting: Physician Assistant

## 2017-06-19 ENCOUNTER — Ambulatory Visit (INDEPENDENT_AMBULATORY_CARE_PROVIDER_SITE_OTHER): Payer: BLUE CROSS/BLUE SHIELD | Admitting: Family Medicine

## 2017-06-19 VITALS — BP 131/88 | HR 81 | Temp 98.1°F | Ht 73.0 in | Wt 355.6 lb

## 2017-06-19 DIAGNOSIS — R600 Localized edema: Secondary | ICD-10-CM

## 2017-06-19 DIAGNOSIS — I10 Essential (primary) hypertension: Secondary | ICD-10-CM | POA: Diagnosis not present

## 2017-06-19 DIAGNOSIS — R946 Abnormal results of thyroid function studies: Secondary | ICD-10-CM

## 2017-06-19 DIAGNOSIS — M722 Plantar fascial fibromatosis: Secondary | ICD-10-CM

## 2017-06-19 DIAGNOSIS — Z1159 Encounter for screening for other viral diseases: Secondary | ICD-10-CM | POA: Diagnosis not present

## 2017-06-19 DIAGNOSIS — Z125 Encounter for screening for malignant neoplasm of prostate: Secondary | ICD-10-CM

## 2017-06-19 DIAGNOSIS — R7989 Other specified abnormal findings of blood chemistry: Secondary | ICD-10-CM

## 2017-06-19 NOTE — Patient Instructions (Signed)
Good to see you today!  Take care and I will be in touch with your labs Please see me in about 6 months for a recheck and fasting labs Please check with your wife about your last tetanus shot- if you are due we can update at our next visit  Stretching your foot before you get out of bed and after sitting, "can rolls," and ice massage can help with your plantar fascitis

## 2017-06-20 LAB — HEPATITIS C ANTIBODY
HEP C AB: NONREACTIVE
SIGNAL TO CUT-OFF: 0.01 (ref <1.00)

## 2017-06-20 LAB — PSA: PSA: 0.19 ng/mL (ref 0.10–4.00)

## 2017-06-20 LAB — TSH: TSH: 3.98 u[IU]/mL (ref 0.35–4.50)

## 2017-06-22 ENCOUNTER — Other Ambulatory Visit: Payer: Self-pay | Admitting: Physician Assistant

## 2017-07-17 ENCOUNTER — Other Ambulatory Visit: Payer: Self-pay | Admitting: Physician Assistant

## 2017-07-17 NOTE — Telephone Encounter (Signed)
Will defer further refills of patient's medications to PCP  

## 2017-09-30 ENCOUNTER — Encounter: Payer: Self-pay | Admitting: Family Medicine

## 2017-09-30 ENCOUNTER — Ambulatory Visit: Payer: BLUE CROSS/BLUE SHIELD | Admitting: Family Medicine

## 2017-09-30 VITALS — BP 136/86 | HR 60 | Temp 98.2°F | Resp 16 | Ht 73.0 in | Wt 363.2 lb

## 2017-09-30 DIAGNOSIS — H6502 Acute serous otitis media, left ear: Secondary | ICD-10-CM | POA: Diagnosis not present

## 2017-09-30 DIAGNOSIS — J40 Bronchitis, not specified as acute or chronic: Secondary | ICD-10-CM

## 2017-09-30 MED ORDER — AZITHROMYCIN 250 MG PO TABS: ORAL_TABLET | ORAL | 0 refills | Status: DC

## 2017-09-30 MED ORDER — LEVOCETIRIZINE DIHYDROCHLORIDE 5 MG PO TABS: 5.0000 mg | ORAL_TABLET | Freq: Every evening | ORAL | 5 refills | Status: DC

## 2017-09-30 MED ORDER — ALBUTEROL SULFATE (2.5 MG/3ML) 0.083% IN NEBU
2.5000 mg | INHALATION_SOLUTION | Freq: Once | RESPIRATORY_TRACT | Status: AC
  Administered 2017-09-30: 2.5 mg via RESPIRATORY_TRACT

## 2017-09-30 MED ORDER — FLUTICASONE PROPIONATE 50 MCG/ACT NA SUSP: 2.0000 | Freq: Every day | NASAL | 6 refills | Status: DC

## 2017-09-30 NOTE — Patient Instructions (Signed)
  Get debrox otc for wax in ears and return to office if symptoms do not improve  Acute Bronchitis, Adult Acute bronchitis is when air tubes (bronchi) in the lungs suddenly get swollen. The condition can make it hard to breathe. It can also cause these symptoms:  A cough.  Coughing up clear, yellow, or green mucus.  Wheezing.  Chest congestion.  Shortness of breath.  A fever.  Body aches.  Chills.  A sore throat.  Follow these instructions at home: Medicines  Take over-the-counter and prescription medicines only as told by your doctor.  If you were prescribed an antibiotic medicine, take it as told by your doctor. Do not stop taking the antibiotic even if you start to feel better. General instructions  Rest.  Drink enough fluids to keep your pee (urine) clear or pale yellow.  Avoid smoking and secondhand smoke. If you smoke and you need help quitting, ask your doctor. Quitting will help your lungs heal faster.  Use an inhaler, cool mist vaporizer, or humidifier as told by your doctor.  Keep all follow-up visits as told by your doctor. This is important. How is this prevented? To lower your risk of getting this condition again:  Wash your hands often with soap and water. If you cannot use soap and water, use hand sanitizer.  Avoid contact with people who have cold symptoms.  Try not to touch your hands to your mouth, nose, or eyes.  Make sure to get the flu shot every year.  Contact a doctor if:  Your symptoms do not get better in 2 weeks. Get help right away if:  You cough up blood.  You have chest pain.  You have very bad shortness of breath.  You become dehydrated.  You faint (pass out) or keep feeling like you are going to pass out.  You keep throwing up (vomiting).  You have a very bad headache.  Your fever or chills gets worse. This information is not intended to replace advice given to you by your health care provider. Make sure you discuss  any questions you have with your health care provider. Document Released: 03/19/2008 Document Revised: 05/09/2016 Document Reviewed: 03/21/2016 Elsevier Interactive Patient Education  2017 [employer]

## 2017-09-30 NOTE — Progress Notes (Signed)
Patient ID: [name], male   DOB: 1961-06-15, 56 y.o.   MRN: [id number]     Subjective:  I acted as a Education administrator for Dr. Carollee Herter.  Guerry Bruin, [place]   Patient ID: [name], male    DOB: [birth year], 56 y.o.   MRN: [id number]  Chief Complaint  Patient presents with  . Cough    HPI  Patient is in today for possible URI.   Has a productive cough, left ear feels stopped up, sneezing, eye watery.  No sore throat.  Symptoms x 1week  Patient Care Team: [name], Gay Filler, MD as PCP - General (Family Medicine)   Past Medical History:  Diagnosis Date  . Deep vein thrombosis (DVT) of left lower extremity ([place])   . GERD (gastroesophageal reflux disease)   . History of chicken pox   . Hypertension   . Measles   . Pulmonary embolism ([place])   . Sleep apnea   . Thyroid disease    Unfounded    Past Surgical History:  Procedure Laterality Date  . COLONOSCOPY WITH PROPOFOL N/A 11/29/2016   Procedure: COLONOSCOPY WITH PROPOFOL;  Surgeon: Ronald Lobo, MD;  Location: [facility];  Service: Endoscopy;  Laterality: N/A;  . FOOT SURGERY     Chain saw accident    Family History  Problem Relation Age of Onset  . Hypertension Father        Living  . Heart disease Mother        Living  . Pulmonary embolism Mother   . Anemia Mother   . Hypertension Other        Paternal Side  . Hypertension Sister        x2  . Migraines Sister   . Allergies Son        x1    Social History   Socioeconomic History  . Marital status: Married    Spouse name: Not on file  . Number of children: Not on file  . Years of education: Not on file  . Highest education level: Not on file  Social Needs  . Financial resource strain: Not on file  . Food insecurity - worry: Not on file  . Food insecurity - inability: Not on file  . Transportation needs - medical: Not on file  . Transportation needs - non-medical: Not on file  Occupational History  . Not on file  Tobacco Use  . Smoking status:  Never Smoker  . Smokeless tobacco: Never Used  Substance and Sexual Activity  . Alcohol use: No    Alcohol/week: 0.0 oz  . Drug use: No  . Sexual activity: No  Other Topics Concern  . Not on file  Social History Narrative  . Not on file    Outpatient Medications Prior to Visit  Medication Sig Dispense Refill  . apixaban (ELIQUIS) 2.5 MG TABS tablet Take 1 tablet (2.5 mg total) by mouth 2 (two) times daily. 60 tablet 12  . carvedilol (COREG) 12.5 MG tablet TAKE 1 TABLET BY MOUTH EVERY NIGHT AT BEDTIME 90 tablet 3  . furosemide (LASIX) 40 MG tablet TAKE 1 TABLET BY MOUTH TWICE DAILY 60 tablet 6  . potassium chloride SA (K-DUR,KLOR-CON) 20 MEQ tablet Take 1 tablet (20 mEq total) by mouth daily. 30 tablet 6  . furosemide (LASIX) 40 MG tablet TAKE 1 TABLET BY MOUTH TWICE DAILY 60 tablet 0   No facility-administered medications prior to visit.     No Known Allergies  Review of Systems  Constitutional: Negative for fever and malaise/fatigue.  HENT: Positive for congestion. Negative for sore throat.        Left ear feels clogged.  Eyes: Negative for blurred vision.  Respiratory: Positive for cough, sputum production and shortness of breath.   Cardiovascular: Negative for chest pain, palpitations and leg swelling.  Gastrointestinal: Negative for vomiting.  Musculoskeletal: Negative for back pain.  Skin: Negative for rash.  Neurological: Negative for loss of consciousness and headaches.       Objective:    Physical Exam  Constitutional: He is oriented to person, place, and time. He appears well-developed and well-nourished.  HENT:  Right Ear: Hearing, tympanic membrane, external ear and ear canal normal. No decreased hearing is noted.  Left Ear: There is tenderness. No drainage or swelling. Tympanic membrane is not injected, not scarred, not perforated, not erythematous, not retracted and not bulging. A middle ear effusion is present.  + PND + errythema  Eyes: Conjunctivae are  normal. Right eye exhibits no discharge. Left eye exhibits no discharge.  Cardiovascular: Normal rate, regular rhythm and normal heart sounds.  No murmur heard. Pulmonary/Chest: Effort normal. No respiratory distress. He has decreased breath sounds. He has no wheezes. He has no rales. He exhibits no tenderness.  Musculoskeletal: He exhibits no edema.  Lymphadenopathy:    He has cervical adenopathy.  Neurological: He is alert and oriented to person, place, and time.  Nursing note and vitals reviewed.   BP 136/86   Pulse 60   Temp 98.2 F (36.8 C) (Oral)   Resp 16   Ht '6\' 1"'  (1.854 m)   Wt (!) 363 lb 3.2 oz (164.7 kg)   SpO2 97%   BMI 47.92 kg/m  Wt Readings from Last 3 Encounters:  09/30/17 (!) 363 lb 3.2 oz (164.7 kg)  06/19/17 (!) 355 lb 9.6 oz (161.3 kg)  05/31/17 (!) 355 lb (161 kg)   BP Readings from Last 3 Encounters:  09/30/17 136/86  06/19/17 131/88  05/31/17 (!) 148/86      There is no immunization history on file for this patient.  Health Maintenance  Topic Date Due  . HIV Screening  09/11/1976  . TETANUS/TDAP  09/11/1980  . INFLUENZA VACCINE  06/19/2018 (Originally 05/15/2017)  . COLONOSCOPY  11/29/2026  . Hepatitis C Screening  Completed    Lab Results  Component Value Date   WBC 7.2 05/31/2017   HGB 15.6 05/31/2017   HCT 43.7 05/31/2017   PLT 212 05/31/2017   GLUCOSE 122 (H) 05/31/2017   CHOL 181 04/24/2016   TRIG 154.0 (H) 04/24/2016   HDL 43.30 04/24/2016   LDLCALC 107 (H) 04/24/2016   ALT 34 05/31/2017   AST 28 05/31/2017   NA 143 05/31/2017   K 3.9 05/31/2017   CL 104 05/31/2017   CREATININE 1.2 05/31/2017   BUN 17 05/31/2017   CO2 32 05/31/2017   TSH 3.98 06/19/2017   PSA 0.19 06/19/2017   INR 1.11 02/04/2016   HGBA1C 5.8 04/24/2016    Lab Results  Component Value Date   TSH 3.98 06/19/2017   Lab Results  Component Value Date   WBC 7.2 05/31/2017   HGB 15.6 05/31/2017   HCT 43.7 05/31/2017   MCV 94 05/31/2017   PLT 212  05/31/2017   Lab Results  Component Value Date   NA 143 05/31/2017   K 3.9 05/31/2017   CHLORIDE 108 01/25/2017   CO2 32 05/31/2017   GLUCOSE 122 (H) 05/31/2017   BUN 17  05/31/2017   CREATININE 1.2 05/31/2017   BILITOT 0.90 05/31/2017   ALKPHOS 91 (H) 05/31/2017   AST 28 05/31/2017   ALT 34 05/31/2017   PROT 7.6 05/31/2017   ALBUMIN 4.1 05/31/2017   CALCIUM 9.6 05/31/2017   ANIONGAP 7 01/25/2017   EGFR 80 (L) 01/25/2017   GFR 75.55 04/24/2016   Lab Results  Component Value Date   CHOL 181 04/24/2016   Lab Results  Component Value Date   HDL 43.30 04/24/2016   Lab Results  Component Value Date   LDLCALC 107 (H) 04/24/2016   Lab Results  Component Value Date   TRIG 154.0 (H) 04/24/2016   Lab Results  Component Value Date   CHOLHDL 4 04/24/2016   Lab Results  Component Value Date   HGBA1C 5.8 04/24/2016         Assessment & Plan:   Problem List Items Addressed This Visit    None    Visit Diagnoses    Bronchitis    -  Primary   Relevant Medications   azithromycin (ZITHROMAX Z-PAK) 250 MG tablet   albuterol (PROVENTIL) (2.5 MG/3ML) 0.083% nebulizer solution 2.5 mg (Completed)   Acute serous otitis media of left ear, recurrence not specified       Relevant Medications   azithromycin (ZITHROMAX Z-PAK) 250 MG tablet   levocetirizine (XYZAL) 5 MG tablet   fluticasone (FLONASE) 50 MCG/ACT nasal spray      I am having Mayer Masker start on azithromycin, levocetirizine, and fluticasone. I am also having him maintain his potassium chloride SA, apixaban, furosemide, and carvedilol. We administered albuterol.  Meds ordered this encounter  Medications  . azithromycin (ZITHROMAX Z-PAK) 250 MG tablet    Sig: As directed    Dispense:  6 each    Refill:  0  . levocetirizine (XYZAL) 5 MG tablet    Sig: Take 1 tablet (5 mg total) by mouth every evening.    Dispense:  30 tablet    Refill:  5  . fluticasone (FLONASE) 50 MCG/ACT nasal spray    Sig: Place 2  sprays into both nostrils daily.    Dispense:  16 g    Refill:  6  . albuterol (PROVENTIL) (2.5 MG/3ML) 0.083% nebulizer solution 2.5 mg    CMA served as scribe during this visit. History, Physical and Plan performed by medical provider. Documentation and orders reviewed and attested to.  Ann Held, DO

## 2017-10-30 ENCOUNTER — Other Ambulatory Visit: Payer: Self-pay | Admitting: Physician Assistant

## 2017-10-30 DIAGNOSIS — R600 Localized edema: Secondary | ICD-10-CM

## 2017-12-06 ENCOUNTER — Inpatient Hospital Stay (HOSPITAL_BASED_OUTPATIENT_CLINIC_OR_DEPARTMENT_OTHER): Payer: BLUE CROSS/BLUE SHIELD | Admitting: Hematology & Oncology

## 2017-12-06 ENCOUNTER — Inpatient Hospital Stay: Payer: BLUE CROSS/BLUE SHIELD | Attending: Hematology & Oncology

## 2017-12-06 ENCOUNTER — Encounter: Payer: Self-pay | Admitting: Hematology & Oncology

## 2017-12-06 ENCOUNTER — Other Ambulatory Visit: Payer: Self-pay

## 2017-12-06 VITALS — BP 142/83 | HR 63 | Temp 98.3°F | Resp 18 | Wt 360.0 lb

## 2017-12-06 DIAGNOSIS — Z79899 Other long term (current) drug therapy: Secondary | ICD-10-CM | POA: Insufficient documentation

## 2017-12-06 DIAGNOSIS — I82432 Acute embolism and thrombosis of left popliteal vein: Secondary | ICD-10-CM | POA: Insufficient documentation

## 2017-12-06 DIAGNOSIS — M25562 Pain in left knee: Secondary | ICD-10-CM | POA: Diagnosis not present

## 2017-12-06 DIAGNOSIS — Z7901 Long term (current) use of anticoagulants: Secondary | ICD-10-CM

## 2017-12-06 LAB — CMP (CANCER CENTER ONLY)
ALT: 28 U/L (ref 0–55)
ANION GAP: 11 (ref 3–11)
AST: 21 U/L (ref 5–34)
Albumin: 4.1 g/dL (ref 3.5–5.0)
Alkaline Phosphatase: 94 U/L (ref 40–150)
BUN: 13 mg/dL (ref 7–26)
CHLORIDE: 104 mmol/L (ref 98–109)
CO2: 27 mmol/L (ref 22–29)
Calcium: 9.6 mg/dL (ref 8.4–10.4)
Creatinine: 1.1 mg/dL (ref 0.70–1.30)
GFR, Estimated: >60 mL/min (ref >60)
Glucose, Bld: 97 mg/dL (ref 70–140)
POTASSIUM: 4.1 mmol/L (ref 3.5–5.1)
Sodium: 142 mmol/L (ref 136–145)
Total Bilirubin: 0.8 mg/dL (ref 0.2–1.2)
Total Protein: 7.2 g/dL (ref 6.4–8.3)

## 2017-12-06 LAB — CBC WITH DIFFERENTIAL (CANCER CENTER ONLY)
Basophils Absolute: 0 10*3/uL (ref 0.0–0.1)
Basophils Relative: 0 %
Eosinophils Absolute: 0.5 10*3/uL (ref 0.0–0.5)
Eosinophils Relative: 6 %
HCT: 43.8 % (ref 38.7–49.9)
HEMOGLOBIN: 15.6 g/dL (ref 13.0–17.1)
LYMPHS ABS: 2.7 10*3/uL (ref 0.9–3.3)
LYMPHS PCT: 36 %
MCH: 32.8 pg (ref 28.0–33.4)
MCHC: 35.6 g/dL (ref 32.0–35.9)
MCV: 92.2 fL (ref 82.0–98.0)
Monocytes Absolute: 0.7 10*3/uL (ref 0.1–0.9)
Monocytes Relative: 9 %
NEUTROS PCT: 49 %
Neutro Abs: 3.7 10*3/uL (ref 1.5–6.5)
Platelet Count: 211 10*3/uL (ref 145–400)
RBC: 4.75 MIL/uL (ref 4.20–5.70)
RDW: 12.2 % (ref 11.1–15.7)
WBC Count: 7.5 10*3/uL (ref 4.0–10.0)

## 2017-12-06 LAB — D-DIMER, QUANTITATIVE (NOT AT ARMC)

## 2017-12-06 NOTE — Progress Notes (Signed)
Hematology and Oncology Follow Up Visit  [name] [id number] 06-Apr-1961 57 y.o. 12/06/2017   Principle Diagnosis:   Idiopathic pulmonary embolism and left leg DVT  Current Therapy:    ELIQUIS 2.5 mg by mouth twice a day-to start in May 2018 - 1 yr of  therapy     Interim History:  [name] is back for follow-up.  We saw him back in the summertime.  Since then, his complaint has been pain and swelling in the left knee.  With his type of work at the [employer] it is possible that he may have done some ligament damage.  He does bend and twist.  I think that we probably will need to get an MRI to see what is going on.  He does have the thrombus in the left leg.  However, his last Doppler back in December 2017 only showed a minor residual thrombus in the left popliteal vein.    He has had no problems with bleeding.  There is no cough or shortness of breath.  He has had no nausea or vomiting.  There is been no change in bowel or bladder habits.  Overall, his performance status is ECOG 0.  Medications:  Current Outpatient Medications:  .  apixaban (ELIQUIS) 2.5 MG TABS tablet, Take 1 tablet (2.5 mg total) by mouth 2 (two) times daily., Disp: 60 tablet, Rfl: 12 .  carvedilol (COREG) 12.5 MG tablet, TAKE 1 TABLET BY MOUTH EVERY NIGHT AT BEDTIME, Disp: 90 tablet, Rfl: 3 .  furosemide (LASIX) 40 MG tablet, TAKE 1 TABLET BY MOUTH TWICE DAILY, Disp: 60 tablet, Rfl: 6 .  potassium chloride SA (K-DUR,KLOR-CON) 20 MEQ tablet, TAKE 1 TABLET BY MOUTH DAILY, Disp: 30 tablet, Rfl: 6  Allergies: No Known Allergies  Past Medical History, Surgical history, Social history, and Family History were reviewed and updated.  Review of Systems: Review of Systems  Constitutional: Negative.   HENT: Negative.   Eyes: Negative.   Respiratory: Negative.   Cardiovascular: Negative.   Gastrointestinal: Negative.   Genitourinary: Negative.   Musculoskeletal: Positive for joint pain.    Skin: Negative.   Neurological: Negative.   Endo/Heme/Allergies: Negative.   Psychiatric/Behavioral: Negative.      Physical Exam:  weight is 360 lb (163.3 kg) (abnormal). His oral temperature is 98.3 F (36.8 C). His blood pressure is 142/83 (abnormal) and his pulse is 63. His respiration is 18 and oxygen saturation is 99%.   Wt Readings from Last 3 Encounters:  12/06/17 (!) 360 lb (163.3 kg)  09/30/17 (!) 363 lb 3.2 oz (164.7 kg)  06/19/17 (!) 355 lb 9.6 oz (161.3 kg)   Physical Exam  Constitutional: He is oriented to person, place, and time.  HENT:  Head: Normocephalic and atraumatic.  Mouth/Throat: Oropharynx is clear and moist.  Eyes: EOM are normal. Pupils are equal, round, and reactive to light.  Neck: Normal range of motion.  Cardiovascular: Normal rate, regular rhythm and normal heart sounds.  Pulmonary/Chest: Effort normal and breath sounds normal.  Abdominal: Soft. Bowel sounds are normal.  Musculoskeletal: Normal range of motion. He exhibits no edema, tenderness or deformity.  He does have some swelling and some slight discomfort in the left knee.  He does have some range of motion limitations of the left knee.  Lymphadenopathy:    He has no cervical adenopathy.  Neurological: He is alert and oriented to person, place, and time.  Skin: Skin is warm and dry. No rash noted. No  erythema.  Psychiatric: He has a normal mood and affect. His behavior is normal. Judgment and thought content normal.  Vitals reviewed.  Lab Results  Component Value Date   WBC 7.2 05/31/2017   HGB 15.6 05/31/2017   HCT 43.7 05/31/2017   MCV 94 05/31/2017   PLT 212 05/31/2017     Chemistry      Component Value Date/Time   NA 143 05/31/2017 1502   NA 140 01/25/2017 0958   K 3.9 05/31/2017 1502   K 3.9 01/25/2017 0958   CL 104 05/31/2017 1502   CO2 32 05/31/2017 1502   CO2 26 01/25/2017 0958   BUN 17 05/31/2017 1502   BUN 19.6 01/25/2017 0958   CREATININE 1.2 05/31/2017 1502    CREATININE 1.0 01/25/2017 0958      Component Value Date/Time   CALCIUM 9.6 05/31/2017 1502   CALCIUM 9.0 01/25/2017 0958   ALKPHOS 91 (H) 05/31/2017 1502   ALKPHOS 85 01/25/2017 0958   AST 28 05/31/2017 1502   AST 19 01/25/2017 0958   ALT 34 05/31/2017 1502   ALT 24 01/25/2017 0958   BILITOT 0.90 05/31/2017 1502   BILITOT 0.82 01/25/2017 0958         Impression and Plan: [name] is a 57 year old white male. He has an idiopathic pulmonary embolus and left lower extremity thrombus.  He is on low-dose maintenance ELIQUIS. I will keep him on this for one year. He will finish up 1 year in May 2019.  We will see what the MRI of the knee shows.  We probably will have to refer him to an orthopedist.  He does not have an orthopedist.  I will plan to get him back in May of this year.  When I see him back we will get a Doppler to assess for the thrombus.  Josph Macho, MD 2/22/20192:52 PM

## 2017-12-07 ENCOUNTER — Ambulatory Visit (HOSPITAL_BASED_OUTPATIENT_CLINIC_OR_DEPARTMENT_OTHER)
Admission: RE | Admit: 2017-12-07 | Discharge: 2017-12-07 | Disposition: A | Discharge status: Home / Self Care | Payer: BLUE CROSS/BLUE SHIELD | Source: Ambulatory Visit | Attending: Hematology & Oncology | Admitting: Hematology & Oncology

## 2017-12-07 DIAGNOSIS — R600 Localized edema: Secondary | ICD-10-CM | POA: Insufficient documentation

## 2017-12-07 DIAGNOSIS — M25562 Pain in left knee: Secondary | ICD-10-CM | POA: Diagnosis not present

## 2017-12-07 DIAGNOSIS — X58XXXA Exposure to other specified factors, initial encounter: Secondary | ICD-10-CM | POA: Insufficient documentation

## 2017-12-07 DIAGNOSIS — M25461 Effusion, right knee: Secondary | ICD-10-CM | POA: Diagnosis not present

## 2017-12-07 DIAGNOSIS — Z7901 Long term (current) use of anticoagulants: Secondary | ICD-10-CM | POA: Insufficient documentation

## 2017-12-07 DIAGNOSIS — S83012A Lateral subluxation of left patella, initial encounter: Secondary | ICD-10-CM | POA: Insufficient documentation

## 2017-12-07 DIAGNOSIS — S83005A Unspecified dislocation of left patella, initial encounter: Secondary | ICD-10-CM | POA: Diagnosis not present

## 2017-12-09 ENCOUNTER — Telehealth: Payer: Self-pay | Admitting: *Deleted

## 2017-12-09 NOTE — Telephone Encounter (Addendum)
Patient is aware of results. He would like referral to be placed on hold until he can take care of it.   ----- Message from Josph MachoPeter R Ennever, MD sent at 12/09/2017  6:16 AM EST ----- Call - looks like you might have a tear in the meniscus of the the left knee.  You may need surgery.  Let me know if you would like to see an orthopedic surgeon.  [name]

## 2017-12-18 NOTE — Progress Notes (Addendum)
[facility] [address] [place], [place] [address] [phone]  Date:  12/19/2017   Name:  [name]   DOB:  12/16/1960   MRN:  [id number]  PCP:  Pearline Cables, MD    Chief Complaint: Follow-up (Pt here for 6 month f/u visit. Pt states that he is fasting for labs. )   History of Present Illness:  [name] is a 57 y.o. very pleasant male patient who presents with the following:  Following up today Last seen by myself in September:  Chronic Issues: Hypertension -- Endorses taking medications as directed. Patient denies chest pain, palpitations, lightheadedness, dizziness, vision changes or frequent headaches.     BP Readings from Last 3 Encounters:  04/24/16 138/88  04/11/16 140/71  02/29/16 136/90   Hypothyroidism -- Is taking levothyroxine as directed. Is due for repeat TSH levels.  Per Dr. Gustavo Lah note from last month: Mr. Shea is a 57 year old white male. He has an idiopathic pulmonary embolus and left lower extremity thrombus. He is on low-dose maintenance ELIQUIS. I will keep him on this for one year. He will finish up 1 year in May 2019  Tdap: will update today Flu: declined  No cholesterol panel in a couple of years on chart   He is fasting today so we will check labs/ lipids He had a left knee MRI recently which did show a meniscal tear. He is seeing Daldorf next week  He is on lasix 40 BID and also K, recent K was normal He has not noted much difficulty with leg swelling   He has noted some difficulty hearing from his ears, left more than right No pain however  Lab Results  Component Value Date   TSH 3.98 06/19/2017    Patient Active Problem List   Diagnosis Date Noted  . Prostate cancer screening 04/29/2016  . Visit for preventive health examination 04/29/2016  . Colon cancer screening 04/29/2016  . Acute deep vein thrombosis (DVT) of left lower extremity  (HCC) 02/05/2016  . Hypothyroidism 02/05/2016  . Bilateral edema of lower extremity 02/05/2016  . Acute pulmonary embolism (HCC) 02/04/2016  . Obstructive sleep apnea on CPAP 02/04/2016  . Lung nodule 02/04/2016  . Morbid obesity (HCC) 02/04/2016  . Essential hypertension 02/04/2016    Past Medical History:  Diagnosis Date  . Deep vein thrombosis (DVT) of left lower extremity (HCC)   . GERD (gastroesophageal reflux disease)   . History of chicken pox   . Hypertension   . Measles   . Pulmonary embolism (HCC)   . Sleep apnea   . Thyroid disease    Unfounded    Past Surgical History:  Procedure Laterality Date  . COLONOSCOPY WITH PROPOFOL N/A 11/29/2016   Procedure: COLONOSCOPY WITH PROPOFOL;  Surgeon: Bernette Redbird, MD;  Location: [facility];  Service: Endoscopy;  Laterality: N/A;  . FOOT SURGERY     Chain saw accident    Social History   Tobacco Use  . Smoking status: Never Smoker  . Smokeless tobacco: Never Used  Substance Use Topics  . Alcohol use: No    Alcohol/week: 0.0 oz  . Drug use: No    Family History  Problem Relation Age of Onset  . Hypertension Father        Living  . Heart disease Mother        Living  . Pulmonary embolism Mother   . Anemia Mother   .  Hypertension Other        Paternal Side  . Hypertension Sister        x2  . Migraines Sister   . Allergies Son        x1    No Known Allergies  Medication list has been reviewed and updated.  Current Outpatient Medications on File Prior to Visit  Medication Sig Dispense Refill  . apixaban (ELIQUIS) 2.5 MG TABS tablet Take 1 tablet (2.5 mg total) by mouth 2 (two) times daily. 60 tablet 12  . carvedilol (COREG) 12.5 MG tablet TAKE 1 TABLET BY MOUTH EVERY NIGHT AT BEDTIME 90 tablet 3  . furosemide (LASIX) 40 MG tablet TAKE 1 TABLET BY MOUTH TWICE DAILY 60 tablet 6  . potassium chloride SA (K-DUR,KLOR-CON) 20 MEQ tablet TAKE 1 TABLET BY MOUTH DAILY 30 tablet 6   No current  facility-administered medications on file prior to visit.     Review of Systems:  As per HPI- otherwise negative.   Physical Examination: Vitals:   12/19/17 1629  BP: 138/84  Pulse: 98  Temp: 98 F (36.7 C)  SpO2: 98%   Vitals:   12/19/17 1629  Weight: (!) 363 lb 3.2 oz (164.7 kg)  Height: 6\' 1"  (1.854 m)   Body mass index is 47.92 kg/m. Ideal Body Weight: Weight in (lb) to have BMI = 25: 189.1  GEN: WDWN, NAD, Non-toxic, A & O x 3, obese, large build HEENT: Atraumatic, Normocephalic. Neck supple. No masses, No LAD. Ears and Nose: No external deformity. CV: RRR, No M/G/R. No JVD. No thrill. No extra heart sounds. PULM: CTA B, no wheezes, crackles, rhonchi. No retractions. No resp. distress. No accessory muscle use. EXTR: No c/c/e NEURO Normal gait.  PSYCH: Normally interactive. Conversant. Not depressed or anxious appearing.  Calm demeanor.   Irrigated ears and removed a large amount of wax from the right, less from the left  TM and IAC then normal Assessment and Plan: Essential hypertension  Acute deep vein thrombosis (DVT) of popliteal vein of left lower extremity (HCC)  Screening for hyperlipidemia - Plan: Lipid panel  Abnormal TSH - Plan: TSH  Immunization due - Plan: Tdap vaccine greater than or equal to 7yo IM  Bilateral impacted cerumen  He is NOT taking any thyroid medication at this time.  He was on some replacement in the past- check TSH today tdap today He is being managed by Dr. Myna HidalgoEnnever for his DVT, is on eliquis Resolved cerumen impaction Will plan further follow- up pending labs.   Signed Abbe AmsterdamJessica Shade Rivenbark, MD  Received his labs 3/8- letter to pt  Results for orders placed or performed in visit on 12/19/17  TSH  Result Value Ref Range   TSH 4.24 0.35 - 4.50 uIU/mL  Lipid panel  Result Value Ref Range   Cholesterol 165 0 - 200 mg/dL   Triglycerides 161.0120.0 0.0 - 149.0 mg/dL   HDL 96.0449.10 >54.09>39.00 mg/dL   VLDL 81.124.0 0.0 - 91.440.0 mg/dL   LDL  Cholesterol 92 0 - 99 mg/dL   Total CHOL/HDL Ratio 3    NonHDL 116.06    Letter to pt

## 2017-12-19 ENCOUNTER — Ambulatory Visit: Payer: BLUE CROSS/BLUE SHIELD | Admitting: Family Medicine

## 2017-12-19 ENCOUNTER — Encounter: Payer: Self-pay | Admitting: Family Medicine

## 2017-12-19 VITALS — BP 138/84 | HR 98 | Temp 98.0°F | Ht 73.0 in | Wt 363.2 lb

## 2017-12-19 DIAGNOSIS — R7989 Other specified abnormal findings of blood chemistry: Secondary | ICD-10-CM

## 2017-12-19 DIAGNOSIS — Z13220 Encounter for screening for lipoid disorders: Secondary | ICD-10-CM

## 2017-12-19 DIAGNOSIS — I82432 Acute embolism and thrombosis of left popliteal vein: Secondary | ICD-10-CM | POA: Diagnosis not present

## 2017-12-19 DIAGNOSIS — I10 Essential (primary) hypertension: Secondary | ICD-10-CM

## 2017-12-19 DIAGNOSIS — H6123 Impacted cerumen, bilateral: Secondary | ICD-10-CM | POA: Diagnosis not present

## 2017-12-19 DIAGNOSIS — Z23 Encounter for immunization: Secondary | ICD-10-CM | POA: Diagnosis not present

## 2017-12-19 NOTE — Patient Instructions (Signed)
I will be in touch with your labs asap Let me know if you do want me to have you see an ENT doctor about your left ear symptoms

## 2017-12-20 LAB — TSH: TSH: 4.24 u[IU]/mL (ref 0.35–4.50)

## 2017-12-20 LAB — LIPID PANEL
Cholesterol: 165 mg/dL (ref 0–200)
HDL: 49.1 mg/dL (ref >39.00)
LDL Cholesterol: 92 mg/dL (ref 0–99)
NONHDL: 116.06
Total CHOL/HDL Ratio: 3
Triglycerides: 120 mg/dL (ref 0.0–149.0)
VLDL: 24 mg/dL (ref 0.0–40.0)

## 2017-12-23 DIAGNOSIS — M1712 Unilateral primary osteoarthritis, left knee: Secondary | ICD-10-CM | POA: Diagnosis not present

## 2018-02-10 ENCOUNTER — Other Ambulatory Visit: Payer: Self-pay | Admitting: *Deleted

## 2018-02-10 DIAGNOSIS — I82412 Acute embolism and thrombosis of left femoral vein: Secondary | ICD-10-CM

## 2018-02-10 MED ORDER — APIXABAN 2.5 MG PO TABS: 2.5000 mg | ORAL_TABLET | Freq: Two times a day (BID) | ORAL | 12 refills | Status: DC

## 2018-02-28 ENCOUNTER — Inpatient Hospital Stay: Payer: BLUE CROSS/BLUE SHIELD | Attending: Hematology & Oncology | Admitting: Family

## 2018-02-28 ENCOUNTER — Ambulatory Visit (HOSPITAL_BASED_OUTPATIENT_CLINIC_OR_DEPARTMENT_OTHER)
Admission: RE | Admit: 2018-02-28 | Discharge: 2018-02-28 | Disposition: A | Discharge status: Home / Self Care | Payer: BLUE CROSS/BLUE SHIELD | Source: Ambulatory Visit | Attending: Hematology & Oncology | Admitting: Hematology & Oncology

## 2018-02-28 ENCOUNTER — Other Ambulatory Visit: Payer: Self-pay

## 2018-02-28 ENCOUNTER — Inpatient Hospital Stay: Payer: BLUE CROSS/BLUE SHIELD

## 2018-02-28 ENCOUNTER — Encounter: Payer: Self-pay | Admitting: Family

## 2018-02-28 VITALS — BP 141/82 | HR 64 | Temp 98.3°F | Resp 18 | Wt 364.0 lb

## 2018-02-28 DIAGNOSIS — Z86711 Personal history of pulmonary embolism: Secondary | ICD-10-CM | POA: Insufficient documentation

## 2018-02-28 DIAGNOSIS — S83207S Unspecified tear of unspecified meniscus, current injury, left knee, sequela: Secondary | ICD-10-CM | POA: Insufficient documentation

## 2018-02-28 DIAGNOSIS — I82432 Acute embolism and thrombosis of left popliteal vein: Secondary | ICD-10-CM

## 2018-02-28 DIAGNOSIS — Z7901 Long term (current) use of anticoagulants: Secondary | ICD-10-CM | POA: Insufficient documentation

## 2018-02-28 DIAGNOSIS — Z86718 Personal history of other venous thrombosis and embolism: Secondary | ICD-10-CM | POA: Insufficient documentation

## 2018-02-28 DIAGNOSIS — X58XXXS Exposure to other specified factors, sequela: Secondary | ICD-10-CM | POA: Diagnosis not present

## 2018-02-28 DIAGNOSIS — R600 Localized edema: Secondary | ICD-10-CM | POA: Diagnosis not present

## 2018-02-28 DIAGNOSIS — I2699 Other pulmonary embolism without acute cor pulmonale: Secondary | ICD-10-CM

## 2018-02-28 DIAGNOSIS — Z79899 Other long term (current) drug therapy: Secondary | ICD-10-CM | POA: Diagnosis not present

## 2018-02-28 LAB — CMP (CANCER CENTER ONLY)
ALK PHOS: 95 U/L — AB (ref 26–84)
ALT: 38 U/L (ref 10–47)
AST: 32 U/L (ref 11–38)
Albumin: 4.3 g/dL (ref 3.5–5.0)
Anion gap: 9 (ref 5–15)
BILIRUBIN TOTAL: 1.1 mg/dL (ref 0.2–1.6)
BUN: 19 mg/dL (ref 7–22)
CHLORIDE: 108 mmol/L (ref 98–108)
CO2: 30 mmol/L (ref 18–33)
Calcium: 9.4 mg/dL (ref 8.0–10.3)
Creatinine: 1.2 mg/dL (ref 0.60–1.20)
Glucose, Bld: 110 mg/dL (ref 73–118)
POTASSIUM: 4.2 mmol/L (ref 3.3–4.7)
Sodium: 147 mmol/L — ABNORMAL HIGH (ref 128–145)
Total Protein: 7.3 g/dL (ref 6.4–8.1)

## 2018-02-28 LAB — CBC WITH DIFFERENTIAL (CANCER CENTER ONLY)
Basophils Absolute: 0 10*3/uL (ref 0.0–0.1)
Basophils Relative: 0 %
Eosinophils Absolute: 0.4 10*3/uL (ref 0.0–0.5)
Eosinophils Relative: 6 %
HEMATOCRIT: 43.1 % (ref 38.7–49.9)
HEMOGLOBIN: 15.1 g/dL (ref 13.0–17.1)
LYMPHS ABS: 2.6 10*3/uL (ref 0.9–3.3)
Lymphocytes Relative: 36 %
MCH: 33.1 pg (ref 28.0–33.4)
MCHC: 35 g/dL (ref 32.0–35.9)
MCV: 94.5 fL (ref 82.0–98.0)
MONOS PCT: 10 %
Monocytes Absolute: 0.7 10*3/uL (ref 0.1–0.9)
NEUTROS ABS: 3.5 10*3/uL (ref 1.5–6.5)
NEUTROS PCT: 48 %
Platelet Count: 201 10*3/uL (ref 145–400)
RBC: 4.56 MIL/uL (ref 4.20–5.70)
RDW: 12.4 % (ref 11.1–15.7)
WBC Count: 7.3 10*3/uL (ref 4.0–10.0)

## 2018-02-28 NOTE — Progress Notes (Signed)
Hematology and Oncology Follow Up Visit  [name] [id number] [birth year] 57 y.o. 02/28/2018   Principle Diagnosis:  Idiopathic pulmonary embolism and left leg DVT  Current Therapy:   ELIQUIS 2.5 mg by mouth twice a day-to start in May 2018 - 1 yr of  therapy   Interim History:  [name] is here today for follow-up. He is doing well and was able to go see Dr. Jerl Santos about the tear in the meniscus of his left knee. He states that since he is not in any pain they did not feels that surgery was needed at this time.  [place] today showed no acute DVT and minimal amount of nonocclusive chronic mural thrombus in a duplicated femoral vein and in the popliteal vein.  He has some swelling around his left knee. This is unchanged. No other swelling in his extremities. No tenderness, numbness or tingling.  He is doing well on maintenance Eliquis and has had no bleeding, bruising or petechiae.  No lymphadenopathy noted on his exam.  No fever, chills, n/v, cough, rash, dizziness, SOB, chest pain, palpitations, abdominal pain or changes in bowel or bladder habits.  He has maintained a good appetite and is staying well hydrated. He would really like to lose some weight and will try to work on this.   ECOG Performance Status: 1 - Symptomatic but completely ambulatory  Medications:  Allergies as of 02/28/2018   No Known Allergies     Medication List        Accurate as of 02/28/18  2:44 PM. Always use your most recent med list.          apixaban 2.5 MG Tabs tablet Commonly known as:  ELIQUIS Take 1 tablet (2.5 mg total) by mouth 2 (two) times daily.   carvedilol 12.5 MG tablet Commonly known as:  COREG TAKE 1 TABLET BY MOUTH EVERY NIGHT AT BEDTIME   furosemide 40 MG tablet Commonly known as:  LASIX TAKE 1 TABLET BY MOUTH TWICE DAILY   potassium chloride SA 20 MEQ tablet Commonly known as:  K-DUR,KLOR-CON TAKE 1 TABLET BY MOUTH DAILY       Allergies: No Known  Allergies  Past Medical History, Surgical history, Social history, and Family History were reviewed and updated.  Review of Systems: All other 10 point review of systems is negative.   Physical Exam:  vitals were not taken for this visit.   Wt Readings from Last 3 Encounters:  12/19/17 (!) 363 lb 3.2 oz (164.7 kg)  12/06/17 (!) 360 lb (163.3 kg)  09/30/17 (!) 363 lb 3.2 oz (164.7 kg)    Ocular: Sclerae unicteric, pupils equal, round and reactive to light Ear-nose-throat: Oropharynx clear, dentition fair Lymphatic: No cervical, supraclavicular or axillary adenopathy Lungs no rales or rhonchi, good excursion bilaterally Heart regular rate and rhythm, no murmur appreciated Abd soft, nontender, positive bowel sounds, no liver or spleen tip palpated on exam, no fluid wave  MSK no focal spinal tenderness, no joint edema Neuro: non-focal, well-oriented, appropriate affect Breasts: Deferred   Lab Results  Component Value Date   WBC 7.5 12/06/2017   HGB 15.6 12/06/2017   HCT 43.8 12/06/2017   MCV 92.2 12/06/2017   PLT 211 12/06/2017   No results found for: FERRITIN, IRON, TIBC, UIBC, IRONPCTSAT Lab Results  Component Value Date   RBC 4.75 12/06/2017   No results found for: KPAFRELGTCHN, LAMBDASER, KAPLAMBRATIO No results found for: IGGSERUM, IGA, IGMSERUM No results found for: TOTALPROTELP, ALBUMINELP, A1GS, A2GS, BETS,  Kathrin Ruddy, SPEI   Chemistry      Component Value Date/Time   NA 142 12/06/2017 1425   NA 143 05/31/2017 1502   NA 140 01/25/2017 0958   K 4.1 12/06/2017 1425   K 3.9 05/31/2017 1502   K 3.9 01/25/2017 0958   CL 104 12/06/2017 1425   CL 104 05/31/2017 1502   CO2 27 12/06/2017 1425   CO2 32 05/31/2017 1502   CO2 26 01/25/2017 0958   BUN 13 12/06/2017 1425   BUN 17 05/31/2017 1502   BUN 19.6 01/25/2017 0958   CREATININE 1.10 12/06/2017 1425   CREATININE 1.2 05/31/2017 1502   CREATININE 1.0 01/25/2017 0958      Component Value Date/Time    CALCIUM 9.6 12/06/2017 1425   CALCIUM 9.6 05/31/2017 1502   CALCIUM 9.0 01/25/2017 0958   ALKPHOS 94 12/06/2017 1425   ALKPHOS 91 (H) 05/31/2017 1502   ALKPHOS 85 01/25/2017 0958   AST 21 12/06/2017 1425   AST 19 01/25/2017 0958   ALT 28 12/06/2017 1425   ALT 34 05/31/2017 1502   ALT 24 01/25/2017 0958   BILITOT 0.8 12/06/2017 1425   BILITOT 0.82 01/25/2017 0958      Impression and Plan: [name] is a very pleasant 57 yo caucasian gentleman with history of idiopathic PE and left lower extremity DVT. US showed minimal amount of nonocclusive chronic mural thrombus in a duplicated femoral vein and in the popliteal vein. He is doing well on maintenance Eliquis and will finish a full year at the end of this month. I will check with Dr. Myna Hidalgo and see if he is agreeable t=with stopping the Eliquis and getting him on to daily aspirin.  We will plan to see him back in another 4 months for follow-up.  He will contact our office with any questions or concerns. We can certainly see him sooner if need be.    Emeline Gins, NP 5/17/20192:44 PM

## 2018-03-06 ENCOUNTER — Telehealth: Payer: Self-pay | Admitting: *Deleted

## 2018-03-06 ENCOUNTER — Telehealth: Payer: Self-pay | Admitting: Family

## 2018-03-06 NOTE — Telephone Encounter (Addendum)
Patient is aware of results.   ----- Message from Josph Macho, MD sent at 03/05/2018  5:47 PM EDT ----- Call - NO active blood clot in your leg!!!  Cindee Lame

## 2018-03-06 NOTE — Telephone Encounter (Signed)
I spoke with [name] and we will have him stop the Eliquis when he runs out later this month and then start taking an 81 mg enteric coated aspirin daily with food. Her verbalized understanding and agreement. No other questions at this time.

## 2018-05-30 ENCOUNTER — Other Ambulatory Visit: Payer: Self-pay | Admitting: Physician Assistant

## 2018-06-23 ENCOUNTER — Other Ambulatory Visit: Payer: Self-pay | Admitting: Family Medicine

## 2018-06-23 DIAGNOSIS — R600 Localized edema: Secondary | ICD-10-CM

## 2018-07-04 ENCOUNTER — Inpatient Hospital Stay: Payer: BLUE CROSS/BLUE SHIELD

## 2018-07-04 ENCOUNTER — Encounter: Payer: Self-pay | Admitting: Hematology & Oncology

## 2018-07-04 ENCOUNTER — Inpatient Hospital Stay: Payer: BLUE CROSS/BLUE SHIELD | Attending: Hematology & Oncology | Admitting: Hematology & Oncology

## 2018-07-04 ENCOUNTER — Other Ambulatory Visit: Payer: Self-pay

## 2018-07-04 VITALS — BP 164/81 | HR 69 | Temp 98.4°F | Resp 20 | Wt 364.0 lb

## 2018-07-04 DIAGNOSIS — I2699 Other pulmonary embolism without acute cor pulmonale: Secondary | ICD-10-CM

## 2018-07-04 DIAGNOSIS — Z7982 Long term (current) use of aspirin: Secondary | ICD-10-CM | POA: Diagnosis not present

## 2018-07-04 DIAGNOSIS — Z7902 Long term (current) use of antithrombotics/antiplatelets: Secondary | ICD-10-CM

## 2018-07-04 DIAGNOSIS — I82402 Acute embolism and thrombosis of unspecified deep veins of left lower extremity: Secondary | ICD-10-CM | POA: Diagnosis not present

## 2018-07-04 DIAGNOSIS — I82432 Acute embolism and thrombosis of left popliteal vein: Secondary | ICD-10-CM

## 2018-07-04 LAB — CMP (CANCER CENTER ONLY)
ALBUMIN: 3.9 g/dL (ref 3.5–5.0)
ALT: 30 U/L (ref 10–47)
ANION GAP: 6 (ref 5–15)
AST: 27 U/L (ref 11–38)
Alkaline Phosphatase: 96 U/L — ABNORMAL HIGH (ref 26–84)
BILIRUBIN TOTAL: 0.7 mg/dL (ref 0.2–1.6)
BUN: 17 mg/dL (ref 7–22)
CALCIUM: 9.3 mg/dL (ref 8.0–10.3)
CO2: 31 mmol/L (ref 18–33)
Chloride: 105 mmol/L (ref 98–108)
Creatinine: 1.2 mg/dL (ref 0.60–1.20)
GLUCOSE: 112 mg/dL (ref 73–118)
Potassium: 4.2 mmol/L (ref 3.3–4.7)
Sodium: 142 mmol/L (ref 128–145)
TOTAL PROTEIN: 7.3 g/dL (ref 6.4–8.1)

## 2018-07-04 LAB — CBC WITH DIFFERENTIAL (CANCER CENTER ONLY)
BASOS ABS: 0 10*3/uL (ref 0.0–0.1)
BASOS PCT: 0 %
EOS ABS: 0.5 10*3/uL (ref 0.0–0.5)
EOS PCT: 6 %
HCT: 43.6 % (ref 38.7–49.9)
Hemoglobin: 15 g/dL (ref 13.0–17.1)
Lymphocytes Relative: 31 %
Lymphs Abs: 2.4 10*3/uL (ref 0.9–3.3)
MCH: 32.3 pg (ref 28.0–33.4)
MCHC: 34.4 g/dL (ref 32.0–35.9)
MCV: 93.8 fL (ref 82.0–98.0)
MONO ABS: 0.8 10*3/uL (ref 0.1–0.9)
MONOS PCT: 10 %
Neutro Abs: 4.1 10*3/uL (ref 1.5–6.5)
Neutrophils Relative %: 53 %
PLATELETS: 224 10*3/uL (ref 145–400)
RBC: 4.65 MIL/uL (ref 4.20–5.70)
RDW: 12 % (ref 11.1–15.7)
WBC Count: 7.8 10*3/uL (ref 4.0–10.0)

## 2018-07-04 LAB — D-DIMER, QUANTITATIVE (NOT AT ARMC): D DIMER QUANT: 1.45 ug{FEU}/mL — AB (ref 0.00–0.50)

## 2018-07-04 NOTE — Progress Notes (Signed)
Hematology and Oncology Follow Up Visit  CILLIAN GWINNER [id number] 27-Jun-1961 57 y.o. 07/04/2018   Principle Diagnosis:  Idiopathic pulmonary embolism and left leg DVT  Current Therapy:   EC ASA 81 mg po q day   Interim History:  Mr. Bihm is here today for follow-up.  Everything is going quite well with him.  He is working quite hard at the [employer].  He is working overtime.   He is now off Eliquis.  He is doing well on aspirin.  He is on baby aspirin daily.  He has had no problems with leg pain.  He has had no problems with cough or shortness of breath.  He has had no change in bowel or bladder habits.  His tomato garden was rated by deer this summer.  As such, he did not have a good tomato crop.  He has had no fever.  He has had no nausea or vomiting.  He has had no headache.  He still has some compression stockings.  He wears these on occasion.  Overall, his performance status is ECOG 0.  He is Medications:  Allergies as of 07/04/2018   No Known Allergies     Medication List        Accurate as of 07/04/18  3:25 PM. Always use your most recent med list.          apixaban 2.5 MG Tabs tablet Commonly known as:  ELIQUIS Take 1 tablet (2.5 mg total) by mouth 2 (two) times daily.   carvedilol 12.5 MG tablet Commonly known as:  COREG TAKE 1 TABLET BY MOUTH EVERY NIGHT AT BEDTIME   furosemide 40 MG tablet Commonly known as:  LASIX Take 1 tablet (40 mg total) by mouth 2 (two) times daily. Please come in and see Dr. Patsy Lager soon   potassium chloride SA 20 MEQ tablet Commonly known as:  K-DUR,KLOR-CON TAKE 1 TABLET BY MOUTH DAILY       Allergies: No Known Allergies  Past Medical History, Surgical history, Social history, and Family History were reviewed and updated.  Review of Systems: Review of Systems  Constitutional: Negative.   HENT: Negative.   Eyes: Negative.   Respiratory: Negative.   Cardiovascular: Negative.     Gastrointestinal: Negative.   Genitourinary: Negative.   Musculoskeletal: Negative.   Skin: Negative.   Neurological: Negative.   Endo/Heme/Allergies: Negative.   Psychiatric/Behavioral: Negative.      Physical Exam:  weight is 364 lb (165.1 kg) (abnormal). His oral temperature is 98.4 F (36.9 C). His blood pressure is 164/81 (abnormal) and his pulse is 69. His respiration is 20 and oxygen saturation is 94%.   Wt Readings from Last 3 Encounters:  07/04/18 (!) 364 lb (165.1 kg)  02/28/18 (!) 364 lb (165.1 kg)  12/19/17 (!) 363 lb 3.2 oz (164.7 kg)    Physical Exam  Constitutional: He is oriented to person, place, and time.  HENT:  Head: Normocephalic and atraumatic.  Mouth/Throat: Oropharynx is clear and moist.  Eyes: Pupils are equal, round, and reactive to light. EOM are normal.  Neck: Normal range of motion.  Cardiovascular: Normal rate, regular rhythm and normal heart sounds.  Pulmonary/Chest: Effort normal and breath sounds normal.  Abdominal: Soft. Bowel sounds are normal.  Musculoskeletal: Normal range of motion. He exhibits no edema, tenderness or deformity.  Lymphadenopathy:    He has no cervical adenopathy.  Neurological: He is alert and oriented to person, place, and time.  Skin: Skin is  warm and dry. No rash noted. No erythema.  Psychiatric: He has a normal mood and affect. His behavior is normal. Judgment and thought content normal.  Vitals reviewed.    Lab Results  Component Value Date   WBC 7.8 07/04/2018   HGB 15.0 07/04/2018   HCT 43.6 07/04/2018   MCV 93.8 07/04/2018   PLT 224 07/04/2018   No results found for: FERRITIN, IRON, TIBC, UIBC, IRONPCTSAT Lab Results  Component Value Date   RBC 4.65 07/04/2018   No results found for: KPAFRELGTCHN, LAMBDASER, KAPLAMBRATIO No results found for: IGGSERUM, IGA, IGMSERUM No results found for: Marda StalkerOTALPROTELP, ALBUMINELP, A1GS, A2GS, BETS, BETA2SER, GAMS, MSPIKE, SPEI   Chemistry      Component Value  Date/Time   NA 142 07/04/2018 1451   NA 143 05/31/2017 1502   NA 140 01/25/2017 0958   K 4.2 07/04/2018 1451   K 3.9 05/31/2017 1502   K 3.9 01/25/2017 0958   CL 105 07/04/2018 1451   CL 104 05/31/2017 1502   CO2 31 07/04/2018 1451   CO2 32 05/31/2017 1502   CO2 26 01/25/2017 0958   BUN 17 07/04/2018 1451   BUN 17 05/31/2017 1502   BUN 19.6 01/25/2017 0958   CREATININE 1.20 07/04/2018 1451   CREATININE 1.2 05/31/2017 1502   CREATININE 1.0 01/25/2017 0958      Component Value Date/Time   CALCIUM 9.3 07/04/2018 1451   CALCIUM 9.6 05/31/2017 1502   CALCIUM 9.0 01/25/2017 0958   ALKPHOS 96 (H) 07/04/2018 1451   ALKPHOS 91 (H) 05/31/2017 1502   ALKPHOS 85 01/25/2017 0958   AST 27 07/04/2018 1451   AST 19 01/25/2017 0958   ALT 30 07/04/2018 1451   ALT 34 05/31/2017 1502   ALT 24 01/25/2017 0958   BILITOT 0.7 07/04/2018 1451   BILITOT 0.82 01/25/2017 0958      Impression and Plan: Mr. Diona BrownerMcDowell is a very pleasant 57 yo caucasian gentleman with history of idiopathic PE and left lower extremity DVT. US showed minimal amount of nonocclusive chronic mural thrombus in a duplicated femoral vein and in the popliteal vein.  We will keep him on the baby aspirin.  I think that he should do okay on the baby aspirin.  I will see him back in 6 months.  If all looks good in 6 months, then I think we can let him go from the clinic as we really would not be doing much for his overall health care.     Josph MachoPeter R Ennever, MD 9/20/20193:25 PM

## 2018-07-10 ENCOUNTER — Other Ambulatory Visit: Payer: Self-pay | Admitting: Family Medicine

## 2018-07-16 DIAGNOSIS — H40033 Anatomical narrow angle, bilateral: Secondary | ICD-10-CM | POA: Diagnosis not present

## 2018-07-16 DIAGNOSIS — H25013 Cortical age-related cataract, bilateral: Secondary | ICD-10-CM | POA: Diagnosis not present

## 2018-07-16 DIAGNOSIS — H40013 Open angle with borderline findings, low risk, bilateral: Secondary | ICD-10-CM | POA: Diagnosis not present

## 2018-07-16 DIAGNOSIS — H5213 Myopia, bilateral: Secondary | ICD-10-CM | POA: Diagnosis not present

## 2018-08-08 ENCOUNTER — Other Ambulatory Visit: Payer: Self-pay | Admitting: Family Medicine

## 2018-09-15 ENCOUNTER — Other Ambulatory Visit: Payer: Self-pay | Admitting: Family Medicine

## 2018-09-15 ENCOUNTER — Ambulatory Visit (HOSPITAL_BASED_OUTPATIENT_CLINIC_OR_DEPARTMENT_OTHER)
Admission: RE | Admit: 2018-09-15 | Discharge: 2018-09-15 | Disposition: A | Discharge status: Home / Self Care | Payer: BLUE CROSS/BLUE SHIELD | Source: Ambulatory Visit | Attending: Family | Admitting: Family

## 2018-09-15 ENCOUNTER — Ambulatory Visit: Payer: BLUE CROSS/BLUE SHIELD | Admitting: Family

## 2018-09-15 ENCOUNTER — Encounter: Payer: Self-pay | Admitting: Family

## 2018-09-15 VITALS — BP 134/66 | HR 74 | Temp 98.6°F | Resp 18 | Ht 73.0 in | Wt 365.0 lb

## 2018-09-15 DIAGNOSIS — R059 Cough, unspecified: Secondary | ICD-10-CM

## 2018-09-15 DIAGNOSIS — R600 Localized edema: Secondary | ICD-10-CM

## 2018-09-15 DIAGNOSIS — R05 Cough: Secondary | ICD-10-CM | POA: Diagnosis not present

## 2018-09-15 DIAGNOSIS — J209 Acute bronchitis, unspecified: Secondary | ICD-10-CM | POA: Diagnosis not present

## 2018-09-15 MED ORDER — PREDNISONE 10 MG PO TABS: ORAL_TABLET | ORAL | 0 refills | Status: DC

## 2018-09-15 MED ORDER — AZITHROMYCIN 250 MG PO TABS: ORAL_TABLET | ORAL | 0 refills | Status: DC

## 2018-09-15 MED ORDER — ALBUTEROL SULFATE HFA 108 (90 BASE) MCG/ACT IN AERS: 2.0000 | INHALATION_SPRAY | Freq: Four times a day (QID) | RESPIRATORY_TRACT | 0 refills | Status: DC | PRN

## 2018-09-15 NOTE — Patient Instructions (Signed)
Please complete chest x-ray on the first floor.  Start Zpak, prednisone taper and albuterol every 6 hours for the next few days. Call if symptoms worsen or if not improved in 3-4 days.

## 2018-09-15 NOTE — Progress Notes (Signed)
Subjective:    Patient ID: [name], male    DOB: [birth year], 57 y.o.   MRN: [id number]  HPI  Patient is a 57 yr old male who presents today with chief complaint of cough. Reports that cough has been present for about 2 weeks.  + associated wheezing. Mild SOB. Green sputum in the AM's.  Reports that he has tried mucinex without much improvement.  Denies fever.    Review of Systems See HPI  Past Medical History:  Diagnosis Date  . Deep vein thrombosis (DVT) of left lower extremity (HCC)   . GERD (gastroesophageal reflux disease)   . History of chicken pox   . Hypertension   . Measles   . Pulmonary embolism (HCC)   . Sleep apnea   . Thyroid disease    Unfounded     Social History   Socioeconomic History  . Marital status: Married    Spouse name: Not on file  . Number of children: Not on file  . Years of education: Not on file  . Highest education level: Not on file  Occupational History  . Not on file  Social Needs  . Financial resource strain: Not on file  . Food insecurity:    Worry: Not on file    Inability: Not on file  . Transportation needs:    Medical: Not on file    Non-medical: Not on file  Tobacco Use  . Smoking status: Never Smoker  . Smokeless tobacco: Never Used  Substance and Sexual Activity  . Alcohol use: No    Alcohol/week: 0.0 standard drinks  . Drug use: No  . Sexual activity: Never  Lifestyle  . Physical activity:    Days per week: Not on file    Minutes per session: Not on file  . Stress: Not on file  Relationships  . Social connections:    Talks on phone: Not on file    Gets together: Not on file    Attends religious service: Not on file    Active member of club or organization: Not on file    Attends meetings of clubs or organizations: Not on file    Relationship status: Not on file  . Intimate partner violence:    Fear of current or ex partner: Not on file    Emotionally abused: Not on file    Physically abused: Not  on file    Forced sexual activity: Not on file  Other Topics Concern  . Not on file  Social History Narrative  . Not on file    Past Surgical History:  Procedure Laterality Date  . COLONOSCOPY WITH PROPOFOL N/A 11/29/2016   Procedure: COLONOSCOPY WITH PROPOFOL;  Surgeon: Bernette Redbirdobert Buccini, MD;  Location: [facility];  Service: Endoscopy;  Laterality: N/A;  . FOOT SURGERY     Chain saw accident    Family History  Problem Relation Age of Onset  . Hypertension Father        Living  . Heart disease Mother        Living  . Pulmonary embolism Mother   . Anemia Mother   . Hypertension Other        Paternal Side  . Hypertension Sister        x2  . Migraines Sister   . Allergies Son        x1    No Known Allergies  Current Outpatient Medications on File Prior to Visit  Medication Sig Dispense Refill  .  aspirin EC 81 MG tablet Take 81 mg by mouth daily.    . carvedilol (COREG) 12.5 MG tablet TAKE 1 TABLET BY MOUTH EACH NIGHT AT BEDTIME 90 tablet 1  . furosemide (LASIX) 40 MG tablet Take 1 tablet (40 mg total) by mouth 2 (two) times daily. 60 tablet 1  . potassium chloride SA (K-DUR,KLOR-CON) 20 MEQ tablet TAKE 1 TABLET BY MOUTH DAILY 30 tablet 1   No current facility-administered medications on file prior to visit.     BP 134/66 (BP Location: Right Arm, Patient Position: Sitting, Cuff Size: Large)   Pulse 74   Temp 98.6 F (37 C) (Oral)   Resp 18   Ht 6\' 1"  (1.854 m)   Wt (!) 365 lb (165.6 kg)   SpO2 98%   BMI 48.16 kg/m       Objective:   Physical Exam  Constitutional: He is oriented to person, place, and time. He appears well-developed and well-nourished. No distress.  HENT:  Head: Normocephalic and atraumatic.  Right Ear: Tympanic membrane and ear canal normal.  Left Ear: Tympanic membrane and ear canal normal.  Mouth/Throat: No oropharyngeal exudate, posterior oropharyngeal edema or posterior oropharyngeal erythema.  Cardiovascular: Normal rate and regular  rhythm.  No murmur heard. Pulmonary/Chest: Effort normal. No respiratory distress. He has wheezes. He has no rales.  Musculoskeletal: He exhibits no edema.  Neurological: He is alert and oriented to person, place, and time.  Skin: Skin is warm and dry.  Psychiatric: He has a normal mood and affect. His behavior is normal. Thought content normal.          Assessment & Plan:  Bronchitis with bronchospasm- will rx with prednisone taper and azithromycin.  Albuterol prn wheezing. Obtain CXR to exclude pneumonia. Advised pt to call if symptoms worsen or if not improved in 3-4 days.

## 2018-09-16 ENCOUNTER — Telehealth: Payer: Self-pay

## 2018-09-16 NOTE — Telephone Encounter (Signed)
Copied from CRM [phone]. Topic: General - Other >> Sep 15, 2018  5:25 PM Cross, [name] wrote: Reason for CRM: Pt stated he had a missed call from the office. Pt requests call back. Cb# [phone]

## 2018-09-16 NOTE — Telephone Encounter (Signed)
I do not see any phone calls documented.   Called him back but LMOM- I am not aware of any call.  We apologize for any inconvenience but I am not sure who might have tried to reach him

## 2018-10-30 ENCOUNTER — Other Ambulatory Visit: Payer: Self-pay | Admitting: Family Medicine

## 2018-11-24 ENCOUNTER — Encounter: Payer: Self-pay | Admitting: Family Medicine

## 2018-11-24 ENCOUNTER — Ambulatory Visit: Payer: BLUE CROSS/BLUE SHIELD | Admitting: Family Medicine

## 2018-11-24 VITALS — BP 132/88 | HR 68 | Temp 97.9°F | Ht 73.0 in | Wt 371.0 lb

## 2018-11-24 DIAGNOSIS — R05 Cough: Secondary | ICD-10-CM

## 2018-11-24 DIAGNOSIS — R059 Cough, unspecified: Secondary | ICD-10-CM

## 2018-11-24 DIAGNOSIS — H6983 Other specified disorders of Eustachian tube, bilateral: Secondary | ICD-10-CM | POA: Diagnosis not present

## 2018-11-24 MED ORDER — PREDNISONE 20 MG PO TABS: 40.0000 mg | ORAL_TABLET | Freq: Every day | ORAL | 0 refills | Status: AC

## 2018-11-24 MED ORDER — FLUTICASONE PROPIONATE 50 MCG/ACT NA SUSP: 2.0000 | Freq: Every day | NASAL | 2 refills | Status: DC

## 2018-11-24 NOTE — Progress Notes (Signed)
Chief Complaint  Patient presents with  . Cough  . Ear Fullness    [name] here for URI complaints.  Duration: 2.5 mo; comes and goes, most recent 3 weeks Associated symptoms: ear fullness and cough Denies: sinus congestion, sinus pain, rhinorrhea, itchy watery eyes, ear pain, ear drainage, sore throat, wheezing, shortness of breath, myalgia and fevers Treatment to date: Nyquil Sick contacts: No  He states he feels fine overall, but these issues have been lingering.  No history of asthma, COPD, smoking, or smoke exposure.  ROS:  Const: Denies fevers HEENT: As noted in HPI Lungs: No SOB  Past Medical History:  Diagnosis Date  . Deep vein thrombosis (DVT) of left lower extremity (HCC)   . GERD (gastroesophageal reflux disease)   . History of chicken pox   . Hypertension   . Measles   . Pulmonary embolism (HCC)   . Sleep apnea   . Thyroid disease    Unfounded    BP 132/88 (BP Location: Left Arm, Patient Position: Sitting, Cuff Size: Large)   Pulse 68   Temp 97.9 F (36.6 C) (Oral)   Ht 6\' 1"  (1.854 m)   Wt (!) 371 lb (168.3 kg)   SpO2 97%   BMI 48.95 kg/m  General: Awake, alert, appears stated age HEENT: AT, [place], ears patent b/l and TM's neg, nares patent w/o discharge, pharynx pink and without exudates, MMM Neck: No masses or asymmetry Heart: RRR Lungs: CTAB, no accessory muscle use Psych: Age appropriate judgment and insight, normal mood and affect  Dysfunction of both eustachian tubes  Cough  Ear fullness likely related to eustachian tube dysfunction.  Will recommend intranasal corticosteroid. I suspect underlying lung disease such as asthma.  I wanted to trial him on an inhaled corticosteroid but he states he does not do well with inhalers.  We will thus do a prednisone burst.  If he does well with this, we will need to consider inhalers in the future should this become a recurrent issue. Continue to push fluids, practice good hand hygiene, cover mouth  when coughing. F/u prn. If starting to experience fevers, shaking, or shortness of breath, seek immediate care. Pt voiced understanding and agreement to the plan.  Jilda Roche Weiner, DO 11/24/18 3:42 PM

## 2018-11-24 NOTE — Patient Instructions (Signed)
Start the nasal spray.   Continue to push fluids, practice good hand hygiene, and cover your mouth if you cough.  If you start having fevers, shaking or shortness of breath, seek immediate care.  Let us know if you need anything.

## 2018-11-25 ENCOUNTER — Other Ambulatory Visit: Payer: Self-pay | Admitting: Family Medicine

## 2018-11-25 DIAGNOSIS — R600 Localized edema: Secondary | ICD-10-CM

## 2018-12-06 NOTE — Progress Notes (Addendum)
[facility] [address], [address] [place], [place] [address] [phone]  Date:  12/11/2018   Name:  [name]   DOB:  Jul 25, 1961   MRN:  [id number]  PCP:  Pearline Cables, MD    Chief Complaint: Hypertension (2 week follow up, med check) and URI (follow up)   History of Present Illness:  [name] is a 58 y.o. very pleasant male patient who presents with the following:  Short-term follow-up visit today-history of hypothyroidism, sleep apnea, obesity, hypertension He was seen by Dr. Carmelia Roller on the 10th with URI Dr. Carmelia Roller thought he might have asthma as underlying concern.  He suggested inhaled corticosteroid, but the patient declined.  [name] states that he typically does not like to use nasal sprays or inhalers.  He just has a hard time using them.  We did do an oral prednisone burst- now finished  He notes that he had a lingering illness since Thanksgiving  He notes that he continues to have an intermittent cough No wheezing noted- he mostly just coughs  No sneezing. No eye sx  No vomiting   I last saw him myself about 1 year ago He has history of idiopathic pulmonary embolus and DVT He was treated with Eliquis for about 1 year-this is now stopped, and he is on aspirin Dr. Myna Hidalgo plan to see him one more time, and then to discharge from hematology  Lab Results  Component Value Date   TSH 4.24 12/19/2017   Could do a thyroid, lipid, PSA today if patient would like Lab Results  Component Value Date   PSA 0.19 06/19/2017   PSA 0.23 04/24/2016   Offered a flu shot but he declines today  Patient Active Problem List   Diagnosis Date Noted  . Prostate cancer screening 04/29/2016  . Visit for preventive health examination 04/29/2016  . Colon cancer screening 04/29/2016  . Acute deep vein thrombosis (DVT) of left lower extremity (HCC) 02/05/2016  . Acute pulmonary embolism (HCC) 02/04/2016  . Lung  nodule 02/04/2016  . Obesity 02/04/2016  . Idiopathic hypothyroidism 02/09/2015  . Edema of both legs 02/09/2015  . Sleep apnea 03/21/2013  . Hypertension, benign 03/21/2013    Past Medical History:  Diagnosis Date  . Deep vein thrombosis (DVT) of left lower extremity (HCC)   . GERD (gastroesophageal reflux disease)   . History of chicken pox   . Hypertension   . Measles   . Pulmonary embolism (HCC)   . Sleep apnea   . Thyroid disease    Unfounded    Past Surgical History:  Procedure Laterality Date  . COLONOSCOPY WITH PROPOFOL N/A 11/29/2016   Procedure: COLONOSCOPY WITH PROPOFOL;  Surgeon: Bernette Redbird, MD;  Location: [facility];  Service: Endoscopy;  Laterality: N/A;  . FOOT SURGERY     Chain saw accident    Social History   Tobacco Use  . Smoking status: Never Smoker  . Smokeless tobacco: Never Used  Substance Use Topics  . Alcohol use: No    Alcohol/week: 0.0 standard drinks  . Drug use: No    Family History  Problem Relation Age of Onset  . Hypertension Father        Living  . Heart disease Mother        Living  . Pulmonary embolism Mother   . Anemia Mother   . Hypertension Other        Paternal Side  .  Hypertension Sister        x2  . Migraines Sister   . Allergies Son        x1    No Known Allergies  Medication list has been reviewed and updated.  Current Outpatient Medications on File Prior to Visit  Medication Sig Dispense Refill  . aspirin EC 81 MG tablet Take 81 mg by mouth daily.    . carvedilol (COREG) 12.5 MG tablet TAKE 1 TABLET BY MOUTH EACH NIGHT AT BEDTIME 90 tablet 1  . fluticasone (FLONASE) 50 MCG/ACT nasal spray Place 2 sprays into both nostrils daily. 16 g 2  . furosemide (LASIX) 40 MG tablet TAKE 1 TABLET BY MOUTH TWICE DAILY 60 tablet 1  . potassium chloride SA (K-DUR,KLOR-CON) 20 MEQ tablet TAKE 1 TABLET BY MOUTH DAILY 30 tablet 0   No current facility-administered medications on file prior to visit.     Review of  Systems:  As per HPI- otherwise negative. No fever or chills, no chest pain or shortness of breath  Physical Examination: Vitals:   12/11/18 1602  BP: 128/74  Pulse: 81  Resp: 20  Temp: 98.1 F (36.7 C)  SpO2: 92%   Vitals:   12/11/18 1602  Weight: (!) 368 lb (166.9 kg)  Height:  (1.854 m)   Body mass index is 48.55 kg/m. Ideal Body Weight: Weight in (lb) to have BMI = 25: 189.1  GEN: WDWN, NAD, Non-toxic, A & O x 3, obese, looks well HEENT: Atraumatic, Normocephalic. Neck supple. No masses, No LAD.  Bilateral TM wnl, oropharynx normal.  PEERL,EOMI.   Ears and Nose: No external deformity. CV: RRR, No M/G/R. No JVD. No thrill. No extra heart sounds. PULM: CTA B, no wheezes, crackles, rhonchi. No retractions. No resp. distress. No accessory muscle use. EXTR: No c/c/e NEURO Normal gait.  PSYCH: Normally interactive. Conversant. Not depressed or anxious appearing.  Calm demeanor.    Assessment and Plan: Chronic cough - Plan: montelukast (SINGULAIR) 10 MG tablet  Abnormal TSH - Plan: TSH  Essential hypertension - Plan: CBC, Comprehensive metabolic panel  Prostate cancer screening - Plan: PSA  Screening for diabetes mellitus - Plan: Hemoglobin A1c  Screening for hyperlipidemia - Plan: Lipid panel  Here today with chronic cough for several months.  I do think cough variant asthma is a consideration, again suggested we try an inhaled steroid.  [name] does not think he would be able to use this.  He is willing to give Singulair a try.  He will let me know how this works for him, I have advised he can take it long-term if it is helpful Routine screening labs as above Offered flu shot, he declined Offered shingles vaccine-he is thinking about this, but does not want to have it today  Will plan further follow- up pending labs.    Signed Abbe Amsterdam, MD  2/28, received his labs  Results for orders placed or performed in visit on 12/11/18  CBC  Result Value  Ref Range   WBC 7.8 4.0 - 10.5 K/uL   RBC 4.81 4.22 - 5.81 Mil/uL   Platelets 231.0 150.0 - 400.0 K/uL   Hemoglobin 15.7 13.0 - 17.0 g/dL   HCT 16.1 09.6 - 04.5 %   MCV 94.1 78.0 - 100.0 fl   MCHC 34.7 30.0 - 36.0 g/dL   RDW 40.9 81.1 - 91.4 %  Comprehensive metabolic panel  Result Value Ref Range   Sodium 139 135 - 145 mEq/L  Potassium 4.2 3.5 - 5.1 mEq/L   Chloride 102 96 - 112 mEq/L   CO2 25 19 - 32 mEq/L   Glucose, Bld 97 70 - 99 mg/dL   BUN 20 6 - 23 mg/dL   Creatinine, Ser 4.09 0.40 - 1.50 mg/dL   Total Bilirubin 0.7 0.2 - 1.2 mg/dL   Alkaline Phosphatase 83 39 - 117 U/L   AST 21 0 - 37 U/L   ALT 23 0 - 53 U/L   Total Protein 7.0 6.0 - 8.3 g/dL   Albumin 4.4 3.5 - 5.2 g/dL   Calcium 9.4 8.4 - 81.1 mg/dL   GFR 91.47 >82.95 mL/min  Hemoglobin A1c  Result Value Ref Range   Hgb A1c MFr Bld 6.4 4.6 - 6.5 %  Lipid panel  Result Value Ref Range   Cholesterol 176 0 - 200 mg/dL   Triglycerides 621.3 0.0 - 149.0 mg/dL   HDL 08.65 >78.46 mg/dL   VLDL 96.2 0.0 - 95.2 mg/dL   LDL Cholesterol 841 (H) 0 - 99 mg/dL   Total CHOL/HDL Ratio 4    NonHDL 129.63   PSA  Result Value Ref Range   PSA 0.19 0.10 - 4.00 ng/mL  TSH  Result Value Ref Range   TSH 3.52 0.35 - 4.50 uIU/mL   Letter to patient

## 2018-12-11 ENCOUNTER — Encounter: Payer: Self-pay | Admitting: Family Medicine

## 2018-12-11 ENCOUNTER — Ambulatory Visit: Payer: BLUE CROSS/BLUE SHIELD | Admitting: Family Medicine

## 2018-12-11 VITALS — BP 128/74 | HR 81 | Temp 98.1°F | Resp 20 | Ht 73.0 in | Wt 368.0 lb

## 2018-12-11 DIAGNOSIS — R053 Chronic cough: Secondary | ICD-10-CM

## 2018-12-11 DIAGNOSIS — R7989 Other specified abnormal findings of blood chemistry: Secondary | ICD-10-CM

## 2018-12-11 DIAGNOSIS — Z13220 Encounter for screening for lipoid disorders: Secondary | ICD-10-CM

## 2018-12-11 DIAGNOSIS — R05 Cough: Secondary | ICD-10-CM | POA: Diagnosis not present

## 2018-12-11 DIAGNOSIS — Z125 Encounter for screening for malignant neoplasm of prostate: Secondary | ICD-10-CM | POA: Diagnosis not present

## 2018-12-11 DIAGNOSIS — Z131 Encounter for screening for diabetes mellitus: Secondary | ICD-10-CM | POA: Diagnosis not present

## 2018-12-11 DIAGNOSIS — I10 Essential (primary) hypertension: Secondary | ICD-10-CM

## 2018-12-11 MED ORDER — MONTELUKAST SODIUM 10 MG PO TABS
10.0000 mg | ORAL_TABLET | Freq: Every day | ORAL | 3 refills | Status: DC
Start: 2018-12-11 — End: 2019-03-23

## 2018-12-11 NOTE — Patient Instructions (Signed)
Great to see you today as always!   I will be in touch with your labs asap  Try adding the singulair to your daily meds- we will see if this helps at all with your cough and ear congestion I would encourage you to have the shingles vaccine- shingrix- at your convenience

## 2018-12-12 LAB — CBC
HCT: 45.3 % (ref 39.0–52.0)
Hemoglobin: 15.7 g/dL (ref 13.0–17.0)
MCHC: 34.7 g/dL (ref 30.0–36.0)
MCV: 94.1 fl (ref 78.0–100.0)
Platelets: 231 10*3/uL (ref 150.0–400.0)
RBC: 4.81 Mil/uL (ref 4.22–5.81)
RDW: 12.7 % (ref 11.5–15.5)
WBC: 7.8 10*3/uL (ref 4.0–10.5)

## 2018-12-12 LAB — COMPREHENSIVE METABOLIC PANEL
ALT: 23 U/L (ref 0–53)
AST: 21 U/L (ref 0–37)
Albumin: 4.4 g/dL (ref 3.5–5.2)
Alkaline Phosphatase: 83 U/L (ref 39–117)
BILIRUBIN TOTAL: 0.7 mg/dL (ref 0.2–1.2)
BUN: 20 mg/dL (ref 6–23)
CO2: 25 meq/L (ref 19–32)
CREATININE: 1.16 mg/dL (ref 0.40–1.50)
Calcium: 9.4 mg/dL (ref 8.4–10.5)
Chloride: 102 mEq/L (ref 96–112)
GFR: 64.84 mL/min (ref >60.00)
GLUCOSE: 97 mg/dL (ref 70–99)
Potassium: 4.2 mEq/L (ref 3.5–5.1)
SODIUM: 139 meq/L (ref 135–145)
Total Protein: 7 g/dL (ref 6.0–8.3)

## 2018-12-12 LAB — TSH: TSH: 3.52 u[IU]/mL (ref 0.35–4.50)

## 2018-12-12 LAB — HEMOGLOBIN A1C: Hgb A1c MFr Bld: 6.4 % (ref 4.6–6.5)

## 2018-12-12 LAB — LIPID PANEL
CHOL/HDL RATIO: 4
Cholesterol: 176 mg/dL (ref 0–200)
HDL: 46.5 mg/dL (ref >39.00)
LDL Cholesterol: 107 mg/dL — ABNORMAL HIGH (ref 0–99)
NONHDL: 129.63
Triglycerides: 115 mg/dL (ref 0.0–149.0)
VLDL: 23 mg/dL (ref 0.0–40.0)

## 2018-12-12 LAB — PSA: PSA: 0.19 ng/mL (ref 0.10–4.00)

## 2018-12-29 ENCOUNTER — Other Ambulatory Visit: Payer: Self-pay | Admitting: Family Medicine

## 2019-01-02 ENCOUNTER — Other Ambulatory Visit: Payer: BLUE CROSS/BLUE SHIELD

## 2019-01-02 ENCOUNTER — Other Ambulatory Visit: Payer: Self-pay | Admitting: Family Medicine

## 2019-01-02 ENCOUNTER — Ambulatory Visit: Payer: BLUE CROSS/BLUE SHIELD | Admitting: Family

## 2019-01-02 DIAGNOSIS — R600 Localized edema: Secondary | ICD-10-CM

## 2019-01-26 ENCOUNTER — Other Ambulatory Visit: Payer: BLUE CROSS/BLUE SHIELD

## 2019-01-26 ENCOUNTER — Ambulatory Visit: Payer: BLUE CROSS/BLUE SHIELD | Admitting: Hematology & Oncology

## 2019-03-02 ENCOUNTER — Other Ambulatory Visit: Payer: Self-pay | Admitting: Family Medicine

## 2019-03-23 ENCOUNTER — Inpatient Hospital Stay (HOSPITAL_BASED_OUTPATIENT_CLINIC_OR_DEPARTMENT_OTHER): Payer: BC Managed Care – PPO | Admitting: Hematology & Oncology

## 2019-03-23 ENCOUNTER — Inpatient Hospital Stay: Payer: BC Managed Care – PPO | Attending: Hematology & Oncology

## 2019-03-23 ENCOUNTER — Encounter: Payer: Self-pay | Admitting: Hematology & Oncology

## 2019-03-23 ENCOUNTER — Other Ambulatory Visit: Payer: Self-pay

## 2019-03-23 VITALS — BP 147/79 | HR 56 | Temp 97.9°F | Resp 18 | Ht 73.0 in | Wt 361.8 lb

## 2019-03-23 DIAGNOSIS — Z7982 Long term (current) use of aspirin: Secondary | ICD-10-CM

## 2019-03-23 DIAGNOSIS — Z86718 Personal history of other venous thrombosis and embolism: Secondary | ICD-10-CM | POA: Diagnosis not present

## 2019-03-23 DIAGNOSIS — Z79899 Other long term (current) drug therapy: Secondary | ICD-10-CM | POA: Diagnosis not present

## 2019-03-23 DIAGNOSIS — Z86711 Personal history of pulmonary embolism: Secondary | ICD-10-CM | POA: Insufficient documentation

## 2019-03-23 DIAGNOSIS — I82422 Acute embolism and thrombosis of left iliac vein: Secondary | ICD-10-CM

## 2019-03-23 DIAGNOSIS — I2699 Other pulmonary embolism without acute cor pulmonale: Secondary | ICD-10-CM

## 2019-03-23 LAB — CBC WITH DIFFERENTIAL (CANCER CENTER ONLY)
Abs Immature Granulocytes: 0.01 10*3/uL (ref 0.00–0.07)
Basophils Absolute: 0.1 10*3/uL (ref 0.0–0.1)
Basophils Relative: 1 %
Eosinophils Absolute: 0.4 10*3/uL (ref 0.0–0.5)
Eosinophils Relative: 6 %
HCT: 42.4 % (ref 39.0–52.0)
Hemoglobin: 14.4 g/dL (ref 13.0–17.0)
Immature Granulocytes: 0 %
Lymphocytes Relative: 39 %
Lymphs Abs: 2.4 10*3/uL (ref 0.7–4.0)
MCH: 31.8 pg (ref 26.0–34.0)
MCHC: 34 g/dL (ref 30.0–36.0)
MCV: 93.6 fL (ref 80.0–100.0)
Monocytes Absolute: 0.5 10*3/uL (ref 0.1–1.0)
Monocytes Relative: 8 %
Neutro Abs: 2.9 10*3/uL (ref 1.7–7.7)
Neutrophils Relative %: 46 %
Platelet Count: 216 10*3/uL (ref 150–400)
RBC: 4.53 MIL/uL (ref 4.22–5.81)
RDW: 11.9 % (ref 11.5–15.5)
WBC Count: 6.2 10*3/uL (ref 4.0–10.5)
nRBC: 0 % (ref 0.0–0.2)

## 2019-03-23 LAB — CMP (CANCER CENTER ONLY)
ALT: 21 U/L (ref 0–44)
AST: 19 U/L (ref 15–41)
Albumin: 4.5 g/dL (ref 3.5–5.0)
Alkaline Phosphatase: 78 U/L (ref 38–126)
Anion gap: 8 (ref 5–15)
BUN: 18 mg/dL (ref 6–20)
CO2: 29 mmol/L (ref 22–32)
Calcium: 9.4 mg/dL (ref 8.9–10.3)
Chloride: 101 mmol/L (ref 98–111)
Creatinine: 1.05 mg/dL (ref 0.61–1.24)
GFR, Est AFR Am: >60 mL/min (ref >60)
GFR, Estimated: >60 mL/min (ref >60)
Glucose, Bld: 110 mg/dL — ABNORMAL HIGH (ref 70–99)
Potassium: 4 mmol/L (ref 3.5–5.1)
Sodium: 138 mmol/L (ref 135–145)
Total Bilirubin: 0.6 mg/dL (ref 0.3–1.2)
Total Protein: 7.1 g/dL (ref 6.5–8.1)

## 2019-03-23 LAB — D-DIMER, QUANTITATIVE (NOT AT ARMC): D-Dimer, Quant: 0.94 ug/mL-FEU — ABNORMAL HIGH (ref 0.00–0.50)

## 2019-03-23 NOTE — Progress Notes (Signed)
Hematology and Oncology Follow Up Visit  [name] [id number] 09/14/1961 58 y.o. 03/23/2019   Principle Diagnosis:  Idiopathic pulmonary embolism and left leg DVT  Current Therapy:   EC ASA 81 mg po q day   Interim History:  [name] is here today for follow-up.  Last time we saw him was back in September 2019.  He is having some more swelling of the left leg.  Quite a bit.  I think we will have to get a Doppler to see if there is propagation of his thromboembolic disease.  He is only on baby aspirin right now.  If you find that these are worse for him, then I think we will probably need to get back anticoagulation.  He was going for about a week or so.  He works for [employer].  Thankfully, he is able to get back to work pretty quickly.  He also is an Engineer, agriculturalexpert gardener.  The rain that we had right before Memorial Day really affected his plants.  He had to do a lot of replanting.    He has had no chest wall pain.  He has had no change in bowel or bladder habits.  He has had no bleeding.      Overall, his performance status is ECOG 0.   Medications:  Allergies as of 03/23/2019   No Known Allergies     Medication List       Accurate as of March 23, 2019  3:42 PM. If you have any questions, ask your nurse or doctor.        STOP taking these medications   fluticasone 50 MCG/ACT nasal spray Commonly known as:  FLONASE Stopped by:  Josph MachoPeter R Coumba Kellison, MD   montelukast 10 MG tablet Commonly known as:  SINGULAIR Stopped by:  Josph MachoPeter R Lauramae Kneisley, MD     TAKE these medications   aspirin EC 81 MG tablet Take 81 mg by mouth daily.   carvedilol 12.5 MG tablet Commonly known as:  COREG TAKE 1 TABLET BY MOUTH DAILY AT BEDTIME   furosemide 40 MG tablet Commonly known as:  LASIX TAKE 1 TABLET BY MOUTH TWICE DAILY   potassium chloride SA 20 MEQ tablet Commonly known as:  K-DUR TAKE 1 TABLET BY MOUTH DAILY       Allergies: No Known Allergies  Past Medical  History, Surgical history, Social history, and Family History were reviewed and updated.  Review of Systems: Review of Systems  Constitutional: Negative.   HENT: Negative.   Eyes: Negative.   Respiratory: Negative.   Cardiovascular: Negative.   Gastrointestinal: Negative.   Genitourinary: Negative.   Musculoskeletal: Negative.   Skin: Negative.   Neurological: Negative.   Endo/Heme/Allergies: Negative.   Psychiatric/Behavioral: Negative.      Physical Exam:  height is 6\' 1"  (1.854 m) and weight is 361 lb 12.8 oz (164.1 kg) (abnormal). His oral temperature is 97.9 F (36.6 C). His blood pressure is 147/79 (abnormal) and his pulse is 56 (abnormal). His respiration is 18 and oxygen saturation is 98%.   Wt Readings from Last 3 Encounters:  03/23/19 (!) 361 lb 12.8 oz (164.1 kg)  12/11/18 (!) 368 lb (166.9 kg)  11/24/18 (!) 371 lb (168.3 kg)    Physical Exam Vitals signs reviewed.  HENT:     Head: Normocephalic and atraumatic.  Eyes:     Pupils: Pupils are equal, round, and reactive to light.  Neck:     Musculoskeletal: Normal range of  motion.  Cardiovascular:     Rate and Rhythm: Normal rate and regular rhythm.     Heart sounds: Normal heart sounds.  Pulmonary:     Effort: Pulmonary effort is normal.     Breath sounds: Normal breath sounds.  Abdominal:     General: Bowel sounds are normal.     Palpations: Abdomen is soft.  Musculoskeletal: Normal range of motion.        General: No tenderness or deformity.     Comments: There is swelling of the left leg.  It is nonpitting edema.  I really cannot palpate a venous cord in the left leg.  He does have decent pulses in his distal extremities.  Right leg is mild edema.  Lymphadenopathy:     Cervical: No cervical adenopathy.  Skin:    General: Skin is warm and dry.     Findings: No erythema or rash.  Neurological:     Mental Status: He is alert and oriented to person, place, and time.  Psychiatric:        Behavior:  Behavior normal.        Thought Content: Thought content normal.        Judgment: Judgment normal.      Lab Results  Component Value Date   WBC 6.2 03/23/2019   HGB 14.4 03/23/2019   HCT 42.4 03/23/2019   MCV 93.6 03/23/2019   PLT 216 03/23/2019   No results found for: FERRITIN, IRON, TIBC, UIBC, IRONPCTSAT Lab Results  Component Value Date   RBC 4.53 03/23/2019   No results found for: KPAFRELGTCHN, LAMBDASER, KAPLAMBRATIO No results found for: IGGSERUM, IGA, IGMSERUM No results found for: Kathrynn Ducking, MSPIKE, SPEI   Chemistry      Component Value Date/Time   NA 138 03/23/2019 1508   NA 143 05/31/2017 1502   NA 140 01/25/2017 0958   K 4.0 03/23/2019 1508   K 3.9 05/31/2017 1502   K 3.9 01/25/2017 0958   CL 101 03/23/2019 1508   CL 104 05/31/2017 1502   CO2 29 03/23/2019 1508   CO2 32 05/31/2017 1502   CO2 26 01/25/2017 0958   BUN 18 03/23/2019 1508   BUN 17 05/31/2017 1502   BUN 19.6 01/25/2017 0958   CREATININE 1.05 03/23/2019 1508   CREATININE 1.2 05/31/2017 1502   CREATININE 1.0 01/25/2017 0958      Component Value Date/Time   CALCIUM 9.4 03/23/2019 1508   CALCIUM 9.6 05/31/2017 1502   CALCIUM 9.0 01/25/2017 0958   ALKPHOS 78 03/23/2019 1508   ALKPHOS 91 (H) 05/31/2017 1502   ALKPHOS 85 01/25/2017 0958   AST 19 03/23/2019 1508   AST 19 01/25/2017 0958   ALT 21 03/23/2019 1508   ALT 34 05/31/2017 1502   ALT 24 01/25/2017 0958   BILITOT 0.6 03/23/2019 1508   BILITOT 0.82 01/25/2017 0958      Impression and Plan: [name] is a very pleasant 58 yo caucasian gentleman with history of idiopathic PE and left lower extremity DVT.   Again, we will have to see what the Doppler shows.  I will try to get this tomorrow if possible.  We will see what the Doppler shows.  This will dictate when we have to get Mr. Holtry back to the office.    Volanda Napoleon, MD 6/8/20203:42 PM

## 2019-03-24 ENCOUNTER — Ambulatory Visit (HOSPITAL_BASED_OUTPATIENT_CLINIC_OR_DEPARTMENT_OTHER)
Admission: RE | Admit: 2019-03-24 | Discharge: 2019-03-24 | Disposition: A | Discharge status: Home / Self Care | Payer: BC Managed Care – PPO | Source: Ambulatory Visit | Attending: Hematology & Oncology | Admitting: Hematology & Oncology

## 2019-03-24 DIAGNOSIS — M7989 Other specified soft tissue disorders: Secondary | ICD-10-CM | POA: Diagnosis not present

## 2019-03-24 DIAGNOSIS — I82422 Acute embolism and thrombosis of left iliac vein: Secondary | ICD-10-CM | POA: Diagnosis not present

## 2019-03-25 ENCOUNTER — Telehealth: Payer: Self-pay | Admitting: *Deleted

## 2019-03-25 NOTE — Telephone Encounter (Signed)
-----   Message from Volanda Napoleon, MD sent at 03/25/2019 10:09 AM EDT ----- Call - the blood clot in your left leg is actually better!!!  I would take 2 baby aspirin a day with food.  Make sure I see him in 3 months~~~  [place]

## 2019-03-25 NOTE — Telephone Encounter (Signed)
Notified pt of results, gave instructions and to expect a call from scheduling for a follow up in 3 months

## 2019-05-08 ENCOUNTER — Other Ambulatory Visit: Payer: Self-pay | Admitting: Family Medicine

## 2019-07-14 ENCOUNTER — Other Ambulatory Visit: Payer: Self-pay | Admitting: *Deleted

## 2019-07-14 DIAGNOSIS — R600 Localized edema: Secondary | ICD-10-CM

## 2019-07-14 MED ORDER — FUROSEMIDE 40 MG PO TABS: 40.0000 mg | ORAL_TABLET | Freq: Two times a day (BID) | ORAL | 0 refills | Status: DC

## 2019-07-14 MED ORDER — POTASSIUM CHLORIDE CRYS ER 20 MEQ PO TBCR: 20.0000 meq | EXTENDED_RELEASE_TABLET | Freq: Every day | ORAL | 0 refills | Status: DC

## 2019-08-17 ENCOUNTER — Other Ambulatory Visit: Payer: Self-pay | Admitting: Family Medicine

## 2019-08-17 DIAGNOSIS — R600 Localized edema: Secondary | ICD-10-CM

## 2019-09-23 ENCOUNTER — Telehealth: Payer: Self-pay | Admitting: *Deleted

## 2019-09-23 MED ORDER — CARVEDILOL 12.5 MG PO TABS: 12.5000 mg | ORAL_TABLET | Freq: Every day | ORAL | 0 refills | Status: DC

## 2019-09-23 NOTE — Telephone Encounter (Signed)
Dr Lorelei Pont -- carvedilol refill sent. Pt has no future appts scheduled. When is he due for a follow up?  Received request from [facility] for Carvedilol 12.5mg  at bedtime. 90 day supply sent.

## 2019-09-24 NOTE — Telephone Encounter (Signed)
Attempted to reach pt and left detailed message on cell # to call and schedule follow up per PCP recommendation below.

## 2019-11-09 ENCOUNTER — Other Ambulatory Visit: Payer: Self-pay

## 2019-11-10 ENCOUNTER — Encounter: Payer: Self-pay | Admitting: Medical

## 2019-11-10 ENCOUNTER — Ambulatory Visit: Payer: BC Managed Care – PPO | Admitting: Medical

## 2019-11-10 ENCOUNTER — Ambulatory Visit (HOSPITAL_BASED_OUTPATIENT_CLINIC_OR_DEPARTMENT_OTHER)
Admission: RE | Admit: 2019-11-10 | Discharge: 2019-11-10 | Disposition: A | Discharge status: Home / Self Care | Payer: BC Managed Care – PPO | Source: Ambulatory Visit | Attending: Medical | Admitting: Medical

## 2019-11-10 VITALS — BP 155/88 | HR 66 | Temp 97.4°F | Resp 18 | Ht 73.0 in | Wt 367.4 lb

## 2019-11-10 DIAGNOSIS — M79609 Pain in unspecified limb: Secondary | ICD-10-CM

## 2019-11-10 DIAGNOSIS — R600 Localized edema: Secondary | ICD-10-CM | POA: Diagnosis not present

## 2019-11-10 DIAGNOSIS — M7989 Other specified soft tissue disorders: Secondary | ICD-10-CM | POA: Diagnosis not present

## 2019-11-10 MED ORDER — DOXYCYCLINE HYCLATE 100 MG PO TABS: ORAL_TABLET | ORAL | 0 refills | Status: DC

## 2019-11-10 NOTE — Progress Notes (Signed)
Subjective:    Patient ID: [name], male    DOB: 17-Mar-1961, 59 y.o.   MRN: [id number]  HPI Pt has recent one month of faint redness to left pretibial area and some recent faint warmth(tenderness and warmth just recently). No sob, no chest pain or wheezing. He does have hx of dvt more than 2 years ago. Was on eliquis in the past and asprin . Dr. Marin Olp dc'd eliquis about 2 years ago.   With faint redness and mild calf swelling pt wanted to be evaluate today.    Review of Systems  Constitutional: Negative for chills, fatigue and fever.  Respiratory: Negative for cough, chest tightness, shortness of breath and wheezing.   Cardiovascular: Negative for chest pain and palpitations.  Gastrointestinal: Negative for abdominal pain.  Genitourinary: Negative for dysuria.  Musculoskeletal:       Left lower ext mild calf swelling.  Neurological: Negative for dizziness, numbness and headaches.  Psychiatric/Behavioral: Negative for behavioral problems.    Past Medical History:  Diagnosis Date  . Deep vein thrombosis (DVT) of left lower extremity ([place])   . GERD (gastroesophageal reflux disease)   . History of chicken pox   . Hypertension   . Measles   . Pulmonary embolism ([place])   . Sleep apnea   . Thyroid disease    Unfounded     Social History   Socioeconomic History  . Marital status: Married    Spouse name: Not on file  . Number of children: Not on file  . Years of education: Not on file  . Highest education level: Not on file  Occupational History  . Not on file  Tobacco Use  . Smoking status: Never Smoker  . Smokeless tobacco: Never Used  Substance and Sexual Activity  . Alcohol use: No    Alcohol/week: 0.0 standard drinks  . Drug use: No  . Sexual activity: Never  Other Topics Concern  . Not on file  Social History Narrative  . Not on file   Social Determinants of Health   Financial Resource Strain:   . Difficulty of Paying Living Expenses: Not on file   Food Insecurity:   . Worried About Charity fundraiser in the Last Year: Not on file  . Ran Out of Food in the Last Year: Not on file  Transportation Needs:   . Lack of Transportation (Medical): Not on file  . Lack of Transportation (Non-Medical): Not on file  Physical Activity:   . Days of Exercise per Week: Not on file  . Minutes of Exercise per Session: Not on file  Stress:   . Feeling of Stress : Not on file  Social Connections:   . Frequency of Communication with Friends and Family: Not on file  . Frequency of Social Gatherings with Friends and Family: Not on file  . Attends Religious Services: Not on file  . Active Member of Clubs or Organizations: Not on file  . Attends Archivist Meetings: Not on file  . Marital Status: Not on file  Intimate Partner Violence:   . Fear of Current or Ex-Partner: Not on file  . Emotionally Abused: Not on file  . Physically Abused: Not on file  . Sexually Abused: Not on file    Past Surgical History:  Procedure Laterality Date  . COLONOSCOPY WITH PROPOFOL N/A 11/29/2016   Procedure: COLONOSCOPY WITH PROPOFOL;  Surgeon: Ronald Lobo, MD;  Location: [facility];  Service: Endoscopy;  Laterality: N/A;  .  FOOT SURGERY     Chain saw accident    Family History  Problem Relation Age of Onset  . Hypertension Father        Living  . Heart disease Mother        Living  . Pulmonary embolism Mother   . Anemia Mother   . Hypertension Other        Paternal Side  . Hypertension Sister        x2  . Migraines Sister   . Allergies Son        x1    No Known Allergies  Current Outpatient Medications on File Prior to Visit  Medication Sig Dispense Refill  . aspirin EC 81 MG tablet Take 81 mg by mouth daily.    . carvedilol (COREG) 12.5 MG tablet Take 1 tablet (12.5 mg total) by mouth at bedtime. 90 tablet 0  . furosemide (LASIX) 40 MG tablet TAKE 1 TABLET BY MOUTH TWICE DAILY 60 tablet 5  . potassium chloride SA (KLOR-CON) 20 MEQ  tablet TAKE 1 TABLET BY MOUTH DAILY 30 tablet 5   No current facility-administered medications on file prior to visit.    BP (!) 155/88   Pulse 66   Temp (!) 97.4 F (36.3 C) (Temporal)   Resp 18   Ht 6\' 1"  (1.854 m)   Wt (!) 367 lb 6.4 oz (166.7 kg)   SpO2 97%   BMI 48.47 kg/m       Objective:   Physical Exam  General- No acute distress. Pleasant patient. Neck- Full range of motion, no jvd Lungs- Clear, even and unlabored. Heart- regular rate and rhythm. Neurologic- CNII- XII grossly intact.  Left lower ext- mild calf swelling. Distal calf faint reddish pink, faint tender. Minimal warmth on palpation. On homans testing reports faint popliteal discomfort.      Assessment & Plan:  Your left lower ext doppler did not show any acute DVT. You do have some redness and faint  tenderness so will prescribe doxycycline antibiotic. Rx advisement given.   Would recommend that you elevate lower ext daily.    Follow up this coming Monday or as needed  If signs/symptoms persist or worsen despite tx then consider further imaging studies to evaluate iliac vessels.  If any dypsnea then recommend ED evaluation.  30 + minutes spent with pt. 50% of time spent with pt counseling on plan going forward.

## 2019-11-10 NOTE — Patient Instructions (Addendum)
Your left lower ext doppler did not show any acute DVT. You do have some redness and faint  tenderness so will prescribe doxycycline antibiotic. Rx advisement given.   Would recommend that you elevate lower ext daily.    Follow up this coming Monday or as needed  If signs/symptoms persist or worsen despite tx then consider further imaging studies to evaluate iliac vessels.  If any dypsnea then recommend ED evaluation.

## 2019-11-13 ENCOUNTER — Other Ambulatory Visit: Payer: Self-pay

## 2019-11-15 NOTE — Progress Notes (Addendum)
[facility] [address], [address] [place], [place] [address] [phone]  Date:  11/16/2019   Name:  FARREN NELLES   DOB:  [birth year]   MRN:  [id number]  PCP:  Pearline Cables, MD    Chief Complaint: Leg Pain (bilateral leg and feet pain, worse in left, hx dvt)   History of Present Illness:  GERMANY CHELF is a 59 y.o. very pleasant male patient who presents with the following:  Here today for follow-up visit History of hypertension, DVT/PE, hypothyroidism, sleep apnea, obesity I last saw him about a year ago  He saw Dr. Myna Hidalgo in June for his history of idiopathic PE and DVT Who was found to have improvement of his lower extremity clot at that time, was advised to take 2 baby aspirin daily and follow-up in 3 months He is continue to take 2 baby aspirin daily, no further clots that we are aware of  He saw my partner Ramon Dredge on January 26 with worsening left leg swelling.  Ultrasound at that time did not show DVT, he was started on doxycycline for possible cellulitis  He continues to have some swelling of his left LE, mild swelling on the right as well.  His legs are improved in the morning, swelling gets worse as the day goes on.  The left is always worse than the right He may get some cramping and pain of his feet They "ball up" and hurt after he sits for a while He has pes planus and wears OTC arch supports  He is aware that he is significantly overweight  He is taking lasix 40 for swelling once or twice a day depending on his schedule- he generally only gets this in once a day, otherwise he is going to the bathroom too much Asa 81 2 daily Potassium daily Coreg 12.5 daily   Flu vaccine-declines  can do routine labs today- ordered for him  BP Readings from Last 3 Encounters:  11/16/19 (!) 142/90  11/10/19 (!) 155/88  03/23/19 (!) 147/79     Lab Results  Component Value Date   TSH 3.52 12/11/2018      Patient Active Problem List   Diagnosis Date Noted  . Prostate cancer screening 04/29/2016  . Visit for preventive health examination 04/29/2016  . Colon cancer screening 04/29/2016  . Acute deep vein thrombosis (DVT) of left lower extremity (HCC) 02/05/2016  . Acute pulmonary embolism (HCC) 02/04/2016  . Lung nodule 02/04/2016  . Obesity 02/04/2016  . Idiopathic hypothyroidism 02/09/2015  . Edema of both legs 02/09/2015  . Sleep apnea 03/21/2013  . Hypertension, benign 03/21/2013    Past Medical History:  Diagnosis Date  . Deep vein thrombosis (DVT) of left lower extremity (HCC)   . GERD (gastroesophageal reflux disease)   . History of chicken pox   . Hypertension   . Measles   . Pulmonary embolism (HCC)   . Sleep apnea   . Thyroid disease    Unfounded    Past Surgical History:  Procedure Laterality Date  . COLONOSCOPY WITH PROPOFOL N/A 11/29/2016   Procedure: COLONOSCOPY WITH PROPOFOL;  Surgeon: Bernette Redbird, MD;  Location: [facility];  Service: Endoscopy;  Laterality: N/A;  . FOOT SURGERY     Chain saw accident    Social History   Tobacco Use  . Smoking status: Never Smoker  . Smokeless tobacco: Never Used  Substance Use Topics  . Alcohol use:  No    Alcohol/week: 0.0 standard drinks  . Drug use: No    Family History  Problem Relation Age of Onset  . Hypertension Father        Living  . Heart disease Mother        Living  . Pulmonary embolism Mother   . Anemia Mother   . Hypertension Other        Paternal Side  . Hypertension Sister        x2  . Migraines Sister   . Allergies Son        x1    No Known Allergies  Medication list has been reviewed and updated.  Current Outpatient Medications on File Prior to Visit  Medication Sig Dispense Refill  . aspirin EC 81 MG tablet Take 81 mg by mouth daily.    . carvedilol (COREG) 12.5 MG tablet Take 1 tablet (12.5 mg total) by mouth at bedtime. 90 tablet 0  . furosemide (LASIX) 40 MG tablet  TAKE 1 TABLET BY MOUTH TWICE DAILY 60 tablet 5  . potassium chloride SA (KLOR-CON) 20 MEQ tablet TAKE 1 TABLET BY MOUTH DAILY 30 tablet 5   No current facility-administered medications on file prior to visit.    Review of Systems:  As per HPI- otherwise negative.   Physical Examination: Vitals:   11/16/19 1626 11/16/19 1643  BP: (!) 172/98 (!) 142/90  Pulse: 63   Resp: 18   Temp: 98.2 F (36.8 C)   SpO2: 96%    Vitals:   11/16/19 1626  Weight: (!) 364 lb (165.1 kg)  Height: 6\' 1"  (1.854 m)   Body mass index is 48.02 kg/m. Ideal Body Weight: Weight in (lb) to have BMI = 25: 189.1  GEN: WDWN, NAD, Non-toxic, A & O x 3 HEENT: Atraumatic, Normocephalic. Neck supple. No masses, No LAD. Ears and Nose: No external deformity. CV: RRR, No M/G/R. No JVD. No thrill. No extra heart sounds. PULM: CTA B, no wheezes, crackles, rhonchi. No retractions. No resp. distress. No accessory muscle use. ABD: S, NT, ND, +BS. No rebound. No HSM. EXTR: No c/c/e NEURO Normal gait.  PSYCH: Normally interactive. Conversant. Not depressed or anxious appearing.  Calm demeanor.  He has swelling of bilateral lower extremities, left more than right.  Pitting edema 2+ left, 1+ right.  No weeping or skin breakdown is apparent, though he does have changes of venous stasis dermatitis more so on the left.  Normal pulses, circulation, sensation in both feet.  He has pes planus bilaterally. Does not seem to have plantar fasciitis.  He endorses pain in the bilateral anterior ankles, more so on the left, with flexion extension  Assessment and Plan: Leg swelling - Plan: Ambulatory referral to Vascular Surgery  Abnormal TSH - Plan: TSH  Essential hypertension - Plan: CBC, Comprehensive metabolic panel, hydrochlorothiazide (MICROZIDE) 12.5 MG capsule  Prostate cancer screening - Plan: PSA  Screening for hyperlipidemia - Plan: Lipid panel  Screening for diabetes mellitus - Plan: Hemoglobin A1c  Sleep apnea,  unspecified type  Screening for HIV (human immunodeficiency virus) - Plan: HIV Antibody (routine testing w rflx)  Bilateral foot pain - Plan: Ambulatory referral to Orthopedic Surgery, CANCELED: Ambulatory referral to Orthopedic Surgery  Morbid obesity (HCC)  Here today with a few issues He has persistent lower extremity swelling, history of idiopathic DVT.  I suspect his swelling is due to a combination of venous stasis and obesity.  We will plan to refer to vascular surgery  for any advice they may have. Patient is aware that significant weight loss is very important for his long-term health.  We discussed bariatric surgery today, this is something he will think about.  He had not really considered it before  Referral to foot and ankle orthopedist as well for any suggestions they may had about his foot and ankle pain  Routine labs today  He is taking Lasix 40 once a day.  We will add HCTZ 12.5 once daily for blood pressure and swelling management Will plan further follow- up pending labs.  Moderate med decision making today  This visit occurred during the SARS-CoV-2 public health emergency.  Safety protocols were in place, including screening questions prior to the visit, additional usage of staff PPE, and extensive cleaning of exam room while observing appropriate contact time as indicated for disinfecting solutions.    Signed [name], MD  Received his labs 2/2-  Called patient 2/3 to go over his lab results He has not previously been treated for hypothyroidism, has been told his thyroid was borderline in the past.  Will start on levothyroxine 25 mcg Recheck TSH in 3 months Recommended that we start a cholesterol medication, he declines for the time being  We will send letter with other lab details Results for orders placed or performed in visit on 11/16/19  CBC  Result Value Ref Range   WBC 8.7 4.0 - 10.5 K/uL   RBC 4.54 4.22 - 5.81 Mil/uL   Platelets 230.0 150.0 -  400.0 K/uL   Hemoglobin 14.7 13.0 - 17.0 g/dL   HCT 42.8 39.0 - 52.0 %   MCV 94.3 78.0 - 100.0 fl   MCHC 34.3 30.0 - 36.0 g/dL   RDW 13.3 11.5 - 15.5 %  Comprehensive metabolic panel  Result Value Ref Range   Sodium 140 135 - 145 mEq/L   Potassium 4.3 3.5 - 5.1 mEq/L   Chloride 103 96 - 112 mEq/L   CO2 29 19 - 32 mEq/L   Glucose, Bld 105 (H) 70 - 99 mg/dL   BUN 21 6 - 23 mg/dL   Creatinine, Ser 1.11 0.40 - 1.50 mg/dL   Total Bilirubin 0.5 0.2 - 1.2 mg/dL   Alkaline Phosphatase 84 39 - 117 U/L   AST 17 0 - 37 U/L   ALT 20 0 - 53 U/L   Total Protein 6.9 6.0 - 8.3 g/dL   Albumin 4.2 3.5 - 5.2 g/dL   GFR 68.00 >60.00 mL/min   Calcium 9.3 8.4 - 10.5 mg/dL  Hemoglobin A1c  Result Value Ref Range   Hgb A1c MFr Bld 6.2 4.6 - 6.5 %  HIV Antibody (routine testing w rflx)  Result Value Ref Range   HIV 1&2 Ab, 4th Generation NON-REACTIVE NON-REACTI  PSA  Result Value Ref Range   PSA 0.18 0.10 - 4.00 ng/mL  TSH  Result Value Ref Range   TSH 5.00 (H) 0.35 - 4.50 uIU/mL  Lipid panel  Result Value Ref Range   Cholesterol 167 0 - 200 mg/dL   Triglycerides 175.0 (H) 0.0 - 149.0 mg/dL   HDL 44.70 >39.00 mg/dL   VLDL 35.0 0.0 - 40.0 mg/dL   LDL Cholesterol 88 0 - 99 mg/dL   Total CHOL/HDL Ratio 4    NonHDL 122.58     The 10-year ASCVD risk score Mikey Bussing DC Jr., et al., 2013) is: 9.3%   Values used to calculate the score:     Age: 59 years  Sex: Male     Is Non-Hispanic African American: No     Diabetic: No     Tobacco smoker: No     Systolic Blood Pressure: 142 mmHg     Is BP treated: Yes     HDL Cholesterol: 44.7 mg/dL     Total Cholesterol: 167 mg/dL

## 2019-11-16 ENCOUNTER — Encounter: Payer: Self-pay | Admitting: Family Medicine

## 2019-11-16 ENCOUNTER — Other Ambulatory Visit: Payer: Self-pay

## 2019-11-16 ENCOUNTER — Ambulatory Visit: Payer: BC Managed Care – PPO | Admitting: Family Medicine

## 2019-11-16 VITALS — BP 142/90 | HR 63 | Temp 98.2°F | Resp 18 | Ht 73.0 in | Wt 364.0 lb

## 2019-11-16 DIAGNOSIS — M7989 Other specified soft tissue disorders: Secondary | ICD-10-CM | POA: Diagnosis not present

## 2019-11-16 DIAGNOSIS — I10 Essential (primary) hypertension: Secondary | ICD-10-CM

## 2019-11-16 DIAGNOSIS — G4730 Sleep apnea, unspecified: Secondary | ICD-10-CM

## 2019-11-16 DIAGNOSIS — E039 Hypothyroidism, unspecified: Secondary | ICD-10-CM

## 2019-11-16 DIAGNOSIS — Z125 Encounter for screening for malignant neoplasm of prostate: Secondary | ICD-10-CM | POA: Diagnosis not present

## 2019-11-16 DIAGNOSIS — Z114 Encounter for screening for human immunodeficiency virus [HIV]: Secondary | ICD-10-CM

## 2019-11-16 DIAGNOSIS — R7989 Other specified abnormal findings of blood chemistry: Secondary | ICD-10-CM | POA: Diagnosis not present

## 2019-11-16 DIAGNOSIS — Z131 Encounter for screening for diabetes mellitus: Secondary | ICD-10-CM

## 2019-11-16 DIAGNOSIS — M79671 Pain in right foot: Secondary | ICD-10-CM

## 2019-11-16 DIAGNOSIS — Z13220 Encounter for screening for lipoid disorders: Secondary | ICD-10-CM

## 2019-11-16 DIAGNOSIS — M79672 Pain in left foot: Secondary | ICD-10-CM

## 2019-11-16 MED ORDER — HYDROCHLOROTHIAZIDE 12.5 MG PO CAPS: 12.5000 mg | ORAL_CAPSULE | Freq: Every day | ORAL | 6 refills | Status: DC

## 2019-11-16 NOTE — Patient Instructions (Signed)
It was good to see you again today I will be in touch with your labs asap I am going to set you up to see 1- foot and ankle specialist 2- vascular specialist Regarding the swelling of your legs and foot/ ankle pain  I do think that significant weight loss will get rid of your swelling and hopefully help with foot and ankle pain as well However- this is easier said than done.  You might think about weight loss surgery  For BP and swelling I would like to add HCTZ 12.5 mg to your current regimen Just take your lasix once a day

## 2019-11-17 LAB — COMPREHENSIVE METABOLIC PANEL
ALT: 20 U/L (ref 0–53)
AST: 17 U/L (ref 0–37)
Albumin: 4.2 g/dL (ref 3.5–5.2)
Alkaline Phosphatase: 84 U/L (ref 39–117)
BUN: 21 mg/dL (ref 6–23)
CO2: 29 mEq/L (ref 19–32)
Calcium: 9.3 mg/dL (ref 8.4–10.5)
Chloride: 103 mEq/L (ref 96–112)
Creatinine, Ser: 1.11 mg/dL (ref 0.40–1.50)
GFR: 68 mL/min (ref >60.00)
Glucose, Bld: 105 mg/dL — ABNORMAL HIGH (ref 70–99)
Potassium: 4.3 mEq/L (ref 3.5–5.1)
Sodium: 140 mEq/L (ref 135–145)
Total Bilirubin: 0.5 mg/dL (ref 0.2–1.2)
Total Protein: 6.9 g/dL (ref 6.0–8.3)

## 2019-11-17 LAB — CBC
HCT: 42.8 % (ref 39.0–52.0)
Hemoglobin: 14.7 g/dL (ref 13.0–17.0)
MCHC: 34.3 g/dL (ref 30.0–36.0)
MCV: 94.3 fl (ref 78.0–100.0)
Platelets: 230 10*3/uL (ref 150.0–400.0)
RBC: 4.54 Mil/uL (ref 4.22–5.81)
RDW: 13.3 % (ref 11.5–15.5)
WBC: 8.7 10*3/uL (ref 4.0–10.5)

## 2019-11-17 LAB — LIPID PANEL
Cholesterol: 167 mg/dL (ref 0–200)
HDL: 44.7 mg/dL (ref >39.00)
LDL Cholesterol: 88 mg/dL (ref 0–99)
NonHDL: 122.58
Total CHOL/HDL Ratio: 4
Triglycerides: 175 mg/dL — ABNORMAL HIGH (ref 0.0–149.0)
VLDL: 35 mg/dL (ref 0.0–40.0)

## 2019-11-17 LAB — PSA: PSA: 0.18 ng/mL (ref 0.10–4.00)

## 2019-11-17 LAB — HIV ANTIBODY (ROUTINE TESTING W REFLEX): HIV 1&2 Ab, 4th Generation: NONREACTIVE

## 2019-11-17 LAB — HEMOGLOBIN A1C: Hgb A1c MFr Bld: 6.2 % (ref 4.6–6.5)

## 2019-11-17 LAB — TSH: TSH: 5 u[IU]/mL — ABNORMAL HIGH (ref 0.35–4.50)

## 2019-11-18 MED ORDER — LEVOTHYROXINE SODIUM 25 MCG PO TABS: 25.0000 ug | ORAL_TABLET | Freq: Every day | ORAL | 6 refills | Status: DC

## 2019-11-18 NOTE — Addendum Note (Signed)
Addended by: Abbe Amsterdam C on: 11/18/2019 01:57 PM   Modules accepted: Orders

## 2019-12-11 ENCOUNTER — Other Ambulatory Visit: Payer: Self-pay | Admitting: Family Medicine

## 2020-01-19 ENCOUNTER — Telehealth (HOSPITAL_COMMUNITY): Payer: Self-pay

## 2020-01-19 NOTE — Telephone Encounter (Signed)

## 2020-01-20 ENCOUNTER — Other Ambulatory Visit: Payer: Self-pay | Admitting: *Deleted

## 2020-01-20 DIAGNOSIS — M25569 Pain in unspecified knee: Secondary | ICD-10-CM

## 2020-01-21 ENCOUNTER — Ambulatory Visit (INDEPENDENT_AMBULATORY_CARE_PROVIDER_SITE_OTHER): Payer: BC Managed Care – PPO | Admitting: Vascular Surgery

## 2020-01-21 ENCOUNTER — Ambulatory Visit (HOSPITAL_COMMUNITY)
Admission: RE | Admit: 2020-01-21 | Discharge: 2020-01-21 | Disposition: A | Discharge status: Home / Self Care | Payer: BC Managed Care – PPO | Source: Ambulatory Visit | Attending: Surgery | Admitting: Surgery

## 2020-01-21 ENCOUNTER — Other Ambulatory Visit: Payer: Self-pay

## 2020-01-21 ENCOUNTER — Encounter: Payer: Self-pay | Admitting: Vascular Surgery

## 2020-01-21 VITALS — BP 151/97 | HR 69 | Temp 97.5°F | Resp 18 | Ht 72.5 in | Wt 365.1 lb

## 2020-01-21 DIAGNOSIS — I890 Lymphedema, not elsewhere classified: Secondary | ICD-10-CM | POA: Diagnosis not present

## 2020-01-21 DIAGNOSIS — M25569 Pain in unspecified knee: Secondary | ICD-10-CM | POA: Diagnosis not present

## 2020-01-21 DIAGNOSIS — M25562 Pain in left knee: Secondary | ICD-10-CM | POA: Diagnosis not present

## 2020-01-21 DIAGNOSIS — I872 Venous insufficiency (chronic) (peripheral): Secondary | ICD-10-CM

## 2020-01-21 NOTE — Progress Notes (Signed)
REASON FOR CONSULT:    Left leg swelling.  The consult is requested by Dr. Shanda Bumps [name].  ASSESSMENT & PLAN:   COMBINED CHRONIC VENOUS INSUFFICIENCY AND LYMPHEDEMA: Based on his history and physical exam, I think the patient has combined chronic venous insufficiency and lymphedema.  I think that his lymphedema is secondary to his chronic venous insufficiency.  He does not have any significant superficial venous reflux.  We have discussed the importance of intermittent leg elevation and the proper positioning for this.  He has been wearing compression stockings but these are somewhat old to wear having him refitted for knee-high compression stockings.  We also discussed the importance of exercise specifically walking and water aerobics.  I have encouraged him to avoid prolonged sitting and standing.  We also discussed the importance of nutrition.  He understands that central obesity especially increases lower extremity venous pressure.  If he fails conservative treatment, I think he would potentially be a candidate for a pneumatic compression device.  I offered to see him back in 1 month to assess his progress however he would like to call if his symptoms progress.  Waverly Ferrari, MD Office: [phone]   HPI:   [name] is a pleasant 59 y.o. male, who presents for evaluation for left leg swelling.  Back in 2017 he had a left lower extremity DVT and pulmonary embolus.  He was on Eliquis for 2 to 3 years.  He was later placed on aspirin and the Eliquis was discontinued.  He tells me that he wore thigh-high stockings for about a year and then converted to knee-high compression stockings.  He continues to work and is on his feet quite a bit.  He repairs school buses.  He does describe some aching pain in his legs which is aggravated by standing and worse at the end of the day.  His symptoms are relieved with leg elevation and his compression stockings help some.  His DVT in 2017 was  unprovoked.  He was followed by Dr. Christin Bach with this.  The patient only risk factor for peripheral vascular disease is hypertension.  He denies any history of diabetes, hypercholesterolemia, family history of premature cardiovascular disease or tobacco use.  I do not get any history of claudication, rest pain, or nonhealing ulcers.  Past Medical History:  Diagnosis Date  . Deep vein thrombosis (DVT) of left lower extremity (HCC)   . GERD (gastroesophageal reflux disease)   . History of chicken pox   . Hypertension   . Measles   . Pulmonary embolism (HCC)   . Sleep apnea   . Thyroid disease    Unfounded    Family History  Problem Relation Age of Onset  . Hypertension Father        Living  . Heart disease Mother        Living  . Pulmonary embolism Mother   . Anemia Mother   . Hypertension Other        Paternal Side  . Hypertension Sister        x2  . Migraines Sister   . Allergies Son        x1    SOCIAL HISTORY: Social History   Socioeconomic History  . Marital status: Married    Spouse name: Not on file  . Number of children: Not on file  . Years of education: Not on file  . Highest education level: Not on file  Occupational History  . Not on  file  Tobacco Use  . Smoking status: Never Smoker  . Smokeless tobacco: Never Used  Substance and Sexual Activity  . Alcohol use: No    Alcohol/week: 0.0 standard drinks  . Drug use: No  . Sexual activity: Never  Other Topics Concern  . Not on file  Social History Narrative  . Not on file   Social Determinants of Health   Financial Resource Strain:   . Difficulty of Paying Living Expenses:   Food Insecurity:   . Worried About Charity fundraiser in the Last Year:   . Arboriculturist in the Last Year:   Transportation Needs:   . Film/video editor (Medical):   Marland Kitchen Lack of Transportation (Non-Medical):   Physical Activity:   . Days of Exercise per Week:   . Minutes of Exercise per Session:   Stress:     . Feeling of Stress :   Social Connections:   . Frequency of Communication with Friends and Family:   . Frequency of Social Gatherings with Friends and Family:   . Attends Religious Services:   . Active Member of Clubs or Organizations:   . Attends Archivist Meetings:   Marland Kitchen Marital Status:   Intimate Partner Violence:   . Fear of Current or Ex-Partner:   . Emotionally Abused:   Marland Kitchen Physically Abused:   . Sexually Abused:     No Known Allergies  Current Outpatient Medications  Medication Sig Dispense Refill  . aspirin EC 81 MG tablet Take 81 mg by mouth daily. Takes two 81 mg tablets in morning.    . carvedilol (COREG) 12.5 MG tablet TAKE 1 TABLET BY MOUTH AT BEDTIME 90 tablet 1  . furosemide (LASIX) 40 MG tablet TAKE 1 TABLET BY MOUTH TWICE DAILY 60 tablet 5  . hydrochlorothiazide (MICROZIDE) 12.5 MG capsule Take 1 capsule (12.5 mg total) by mouth daily. 30 capsule 6  . levothyroxine (SYNTHROID) 25 MCG tablet Take 1 tablet (25 mcg total) by mouth daily before breakfast. 30 tablet 6  . potassium chloride SA (KLOR-CON) 20 MEQ tablet TAKE 1 TABLET BY MOUTH DAILY 30 tablet 5   No current facility-administered medications for this visit.    REVIEW OF SYSTEMS:  [X]  denotes positive finding, [ ]  denotes negative finding Cardiac  Comments:  Chest pain or chest pressure:    Shortness of breath upon exertion:    Short of breath when lying flat:    Irregular heart rhythm:        Vascular    Pain in calf, thigh, or hip brought on by ambulation:    Pain in feet at night that wakes you up from your sleep:     Blood clot in your veins:    Leg swelling:  x       Pulmonary    Oxygen at home:    Productive cough:     Wheezing:         Neurologic    Sudden weakness in arms or legs:     Sudden numbness in arms or legs:     Sudden onset of difficulty speaking or slurred speech:    Temporary loss of vision in one eye:     Problems with dizziness:         Gastrointestinal     Blood in stool:     Vomited blood:         Genitourinary    Burning when urinating:     Blood  in urine:        Psychiatric    Major depression:         Hematologic    Bleeding problems:    Problems with blood clotting too easily:        Skin    Rashes or ulcers:        Constitutional    Fever or chills:     PHYSICAL EXAM:   Vitals:   01/21/20 1422  BP: (!) 151/97  Pulse: 69  Resp: 18  Temp: (!) 97.5 F (36.4 C)  TempSrc: Temporal  SpO2: 98%  Weight: (!) 365 lb 1.6 oz (165.6 kg)  Height: 6' 0.5" (1.842 m)   Body mass index is 48.84 kg/m.   GENERAL: The patient is a well-nourished male, in no acute distress. The vital signs are documented above. CARDIAC: There is a regular rate and rhythm.  VASCULAR: I do not detect carotid bruits. He has palpable femoral pulses bilaterally. I cannot palpate pedal pulses because of the swelling but he has a biphasic dorsalis pedis and posterior tibial signal bilaterally. He has bilateral lower extremity swelling which is worse on the left side.  He has sparing on the dorsum of his foot and some nonpitting edema consistent with lymphedema.  He also has hyperpigmentation bilaterally.  RIGHT LOWER EXTREMITY: Calf 19-1/4 inches, ankle 12 inches. LEFT LOWER EXTREMITY: Calf 20-1/2 inches, ankle 12-1/4 inches.       PULMONARY: There is good air exchange bilaterally without wheezing or rales. ABDOMEN: Soft and non-tender with normal pitched bowel sounds.  MUSCULOSKELETAL: There are no major deformities or cyanosis. NEUROLOGIC: No focal weakness or paresthesias are detected. SKIN: There are no ulcers or rashes noted. PSYCHIATRIC: The patient has a normal affect.  DATA:    VENOUS DUPLEX: I have independently interpreted his left lower extremity venous duplex scan.  He has evidence of partially occlusive chronic thrombus in a paired femoral vein in the thigh and also in the popliteal vein.  He also has deep venous reflux involving  the common femoral vein, femoral vein, and popliteal vein.  There is no superficial venous reflux.  There is no evidence of acute DVT.

## 2020-02-14 DIAGNOSIS — R7303 Prediabetes: Secondary | ICD-10-CM | POA: Insufficient documentation

## 2020-02-14 NOTE — Patient Instructions (Addendum)
It was good to see you today, I will be in touch with your labs and we can plan the next step  Your BP is still a bit too hight- let's have you stop hctz plain and start on losartan/hctz.  Please let me know how your BP responds to this change  I would encourage you to have your covid 19 vaccine asap.  Myself, my husband, parents and in-laws have all been vaccinated and I think it is a safe vaccine and a very good idea for you.    Continue to work on weight loss and take care

## 2020-02-14 NOTE — Progress Notes (Addendum)
[facility] [address] [place], [place] [address] [phone]  Date:  02/15/2020   Name:  [name]   DOB:  [birth year]   MRN:  [id number]  PCP:  Pearline Cables, MD    Chief Complaint: Hypertension (swelling in legs follow up, also seen vascular specialist)   History of Present Illness:  [name] is a 59 y.o. very pleasant male patient who presents with the following:  Here today for a periodic follow-up visit Last seen by myself in February of this year History of hypertension, DVT, hypothyroidism, sleep apnea, obesity, prediabetes  From our last visit: He has persistent lower extremity swelling, history of idiopathic DVT.  I suspect his swelling is due to a combination of venous stasis and obesity.  We will plan to refer to vascular surgery for any advice they may have. Patient is aware that significant weight loss is very important for his long-term health.  We discussed bariatric surgery today, this is something he will think about.  He had not really considered it before Referral to foot and ankle orthopedist as well for any suggestions they may had about his foot and ankle pain Routine labs today He is taking Lasix 40 once a day.  We will add HCTZ 12.5 once daily for blood pressure and swelling management  I had him follow-up with vascular surgery in April of this year, they feel his lower extremity edema is likely due to venous insufficiency and lymphedema exacerbated by central obesity He was suggested to wear compression socks and he does find that they help Pt reports that he is overall doing well, he is generally more active during the summer as he plans a large vegetable garden  COVID-19 vaccine-he has not done yet, he is suspicious of vaccine.  We discussed this today and I encouraged him to receive are safe and effective COVID-19 vaccine Colon cancer screen up-to-date Most recent labs in  February  He is trying to be more active and has lost a few lbs -he really hopes to lose more  Aspirin 81, 2 daily Carvedilol Lasix- taking one daiy HCTZ- taking 12.5 Synthroid Potassium  BP Readings from Last 3 Encounters:  02/15/20 (!) 150/90  01/21/20 (!) 151/97  11/16/19 (!) 142/90   Wt Readings from Last 3 Encounters:  02/15/20 (!) 353 lb (160.1 kg)  01/21/20 (!) 365 lb 1.6 oz (165.6 kg)  11/16/19 (!) 364 lb (165.1 kg)    Patient Active Problem List   Diagnosis Date Noted  . Prediabetes 02/14/2020  . Prostate cancer screening 04/29/2016  . Visit for preventive health examination 04/29/2016  . Colon cancer screening 04/29/2016  . Acute deep vein thrombosis (DVT) of left lower extremity (HCC) 02/05/2016  . Acute pulmonary embolism (HCC) 02/04/2016  . Lung nodule 02/04/2016  . Obesity 02/04/2016  . Idiopathic hypothyroidism 02/09/2015  . Edema of both legs 02/09/2015  . Sleep apnea 03/21/2013  . Hypertension, benign 03/21/2013    Past Medical History:  Diagnosis Date  . Deep vein thrombosis (DVT) of left lower extremity (HCC)   . GERD (gastroesophageal reflux disease)   . History of chicken pox   . Hypertension   . Measles   . Pulmonary embolism (HCC)   . Sleep apnea   . Thyroid disease    Unfounded    Past Surgical History:  Procedure Laterality Date  . COLONOSCOPY WITH PROPOFOL N/A 11/29/2016   Procedure:  COLONOSCOPY WITH PROPOFOL;  Surgeon: Bernette Redbird, MD;  Location: [facility];  Service: Endoscopy;  Laterality: N/A;  . FOOT SURGERY     Chain saw accident    Social History   Tobacco Use  . Smoking status: Never Smoker  . Smokeless tobacco: Never Used  Substance Use Topics  . Alcohol use: No    Alcohol/week: 0.0 standard drinks  . Drug use: No    Family History  Problem Relation Age of Onset  . Hypertension Father        Living  . Heart disease Mother        Living  . Pulmonary embolism Mother   . Anemia Mother   . Hypertension  Other        Paternal Side  . Hypertension Sister        x2  . Migraines Sister   . Allergies Son        x1    No Known Allergies  Medication list has been reviewed and updated.  Current Outpatient Medications on File Prior to Visit  Medication Sig Dispense Refill  . aspirin EC 81 MG tablet Take 81 mg by mouth daily. Takes two 81 mg tablets in morning.    . carvedilol (COREG) 12.5 MG tablet TAKE 1 TABLET BY MOUTH AT BEDTIME 90 tablet 1  . furosemide (LASIX) 40 MG tablet TAKE 1 TABLET BY MOUTH TWICE DAILY 60 tablet 5  . levothyroxine (SYNTHROID) 25 MCG tablet Take 1 tablet (25 mcg total) by mouth daily before breakfast. 30 tablet 6  . potassium chloride SA (KLOR-CON) 20 MEQ tablet TAKE 1 TABLET BY MOUTH DAILY 30 tablet 5   No current facility-administered medications on file prior to visit.    Review of Systems:  As per HPI- otherwise negative.   Physical Examination: Vitals:   02/15/20 1512 02/15/20 1526  BP: (!) 150/88 (!) 150/90  Pulse: 75   Resp: 18   Temp: 98 F (36.7 C)   SpO2: 96%    Vitals:   02/15/20 1512  Weight: (!) 353 lb (160.1 kg)  Height: 6' 0.5" (1.842 m)   Body mass index is 47.22 kg/m. Ideal Body Weight: Weight in (lb) to have BMI = 25: 186.5  GEN: no acute distress.  Obese, otherwise looks well  HEENT: Atraumatic, Normocephalic.  Ears and Nose: No external deformity. CV: RRR, No M/G/R. No JVD. No thrill. No extra heart sounds. PULM: CTA B, no wheezes, crackles, rhonchi. No retractions. No resp. distress. No accessory muscle use. ABD: S, NT, ND, +BS. No rebound. No HSM. EXTR: No c/c/chronic stable edema both lower extremities PSYCH: Normally interactive. Conversant.   BP Readings from Last 3 Encounters:  02/15/20 (!) 150/90  01/21/20 (!) 151/97  11/16/19 (!) 142/90   He does use his sleep apnea machine faithfully    Assessment and Plan: Essential hypertension - Plan: Basic metabolic panel, losartan-hydrochlorothiazide (HYZAAR) 50-12.5  MG tablet  Prediabetes - Plan: Hemoglobin A1c  Screening for hyperlipidemia - Plan: TSH  Leg swelling  Morbid obesity (HCC)  Here today for a follow-up visit.  Patient has history of chronic lower extremity edema, he is using HCTZ and also furosemide Blood pressure is elevated, will DC plain hydrochlorothiazide and change to losartan/HCTZ Labs pending as above- Will plan further follow- up pending labs. Encouraged him to continue to work on weight loss and exercise Moderate medical decision making today This visit occurred during the SARS-CoV-2 public health emergency.  Safety protocols were in place, including  screening questions prior to the visit, additional usage of staff PPE, and extensive cleaning of exam room while observing appropriate contact time as indicated for disinfecting solutions.    Signed [name], MD  addnd 5/4- received labs as below- letter to pt Results for orders placed or performed in visit on 77/11/65  Basic metabolic panel  Result Value Ref Range   Sodium 140 135 - 145 mEq/L   Potassium 3.8 3.5 - 5.1 mEq/L   Chloride 103 96 - 112 mEq/L   CO2 27 19 - 32 mEq/L   Glucose, Bld 128 (H) 70 - 99 mg/dL   BUN 22 6 - 23 mg/dL   Creatinine, Ser 1.40 0.40 - 1.50 mg/dL   GFR 51.97 (L) >60.00 mL/min   Calcium 9.4 8.4 - 10.5 mg/dL  Hemoglobin A1c  Result Value Ref Range   Hgb A1c MFr Bld 6.3 4.6 - 6.5 %  TSH  Result Value Ref Range   TSH 2.89 0.35 - 4.50 uIU/mL

## 2020-02-15 ENCOUNTER — Ambulatory Visit: Payer: BC Managed Care – PPO | Admitting: Family Medicine

## 2020-02-15 ENCOUNTER — Other Ambulatory Visit: Payer: Self-pay

## 2020-02-15 ENCOUNTER — Encounter: Payer: Self-pay | Admitting: Family Medicine

## 2020-02-15 VITALS — BP 150/90 | HR 75 | Temp 98.0°F | Resp 18 | Ht 72.5 in | Wt 353.0 lb

## 2020-02-15 DIAGNOSIS — Z13220 Encounter for screening for lipoid disorders: Secondary | ICD-10-CM

## 2020-02-15 DIAGNOSIS — I10 Essential (primary) hypertension: Secondary | ICD-10-CM

## 2020-02-15 DIAGNOSIS — M7989 Other specified soft tissue disorders: Secondary | ICD-10-CM

## 2020-02-15 DIAGNOSIS — R7303 Prediabetes: Secondary | ICD-10-CM

## 2020-02-15 MED ORDER — LOSARTAN POTASSIUM-HCTZ 50-12.5 MG PO TABS: 1.0000 | ORAL_TABLET | Freq: Every day | ORAL | 6 refills | Status: DC

## 2020-02-16 LAB — BASIC METABOLIC PANEL
BUN: 22 mg/dL (ref 6–23)
CO2: 27 mEq/L (ref 19–32)
Calcium: 9.4 mg/dL (ref 8.4–10.5)
Chloride: 103 mEq/L (ref 96–112)
Creatinine, Ser: 1.4 mg/dL (ref 0.40–1.50)
GFR: 51.97 mL/min — ABNORMAL LOW (ref >60.00)
Glucose, Bld: 128 mg/dL — ABNORMAL HIGH (ref 70–99)
Potassium: 3.8 mEq/L (ref 3.5–5.1)
Sodium: 140 mEq/L (ref 135–145)

## 2020-02-16 LAB — TSH: TSH: 2.89 u[IU]/mL (ref 0.35–4.50)

## 2020-02-16 LAB — HEMOGLOBIN A1C: Hgb A1c MFr Bld: 6.3 % (ref 4.6–6.5)

## 2020-02-18 ENCOUNTER — Other Ambulatory Visit: Payer: Self-pay | Admitting: Family Medicine

## 2020-02-18 DIAGNOSIS — R600 Localized edema: Secondary | ICD-10-CM

## 2020-04-28 ENCOUNTER — Other Ambulatory Visit: Payer: Self-pay

## 2020-04-28 ENCOUNTER — Ambulatory Visit: Payer: BC Managed Care – PPO | Admitting: Family Medicine

## 2020-04-28 ENCOUNTER — Encounter: Payer: Self-pay | Admitting: Family Medicine

## 2020-04-28 VITALS — BP 134/82 | HR 64 | Temp 98.1°F | Resp 18 | Ht 72.5 in | Wt 361.0 lb

## 2020-04-28 DIAGNOSIS — I10 Essential (primary) hypertension: Secondary | ICD-10-CM | POA: Diagnosis not present

## 2020-04-28 DIAGNOSIS — G5623 Lesion of ulnar nerve, bilateral upper limbs: Secondary | ICD-10-CM

## 2020-04-28 MED ORDER — PREDNISONE 20 MG PO TABS: ORAL_TABLET | ORAL | 0 refills | Status: DC

## 2020-04-28 NOTE — Progress Notes (Signed)
[facility] [address] [place], [place] [address] [phone]  Date:  04/28/2020   Name:  [name]   DOB:  [birth year]   MRN:  [id number]  PCP:  Pearline Cables, MD    Chief Complaint: Hand Numbness (bilateral hand numbness, tingling, 3 weeks ago)   History of Present Illness:  [name] is a 59 y.o. very pleasant male patient who presents with the following:  Patient with history of hypertension, DVT, hypothyroidism, sleep apnea, obesity, prediabetes-here today to discuss concern of hand numbness I saw him most recently in May He has persistent lower extremity swelling and history of idiopathic DVT.  He has been evaluated by vascular surgery who thought his swelling was due to venous insufficiency and lymphedema, exacerbated by central obesity  Covid series-not done BMP, A1c done in May  Today he notes that both hands feel weak, and he has numbness in his 4th and 5th fingers bilaterally, the numbness does seem to go into the ulnar aspect of his hand as well.  He feels as though his grip strength in the rest of the hand may be slightly decreased.  He was on vacation in June and did not take his CPAP machine- he was not able to sleep in bed so he slept in a recliner, his bilateral arms were resting on the armrest which were unpadded wood.  He wonders if this triggered his sx.  At the end of his vacation week he started to notice this issue-however it has continued now that he is back sleeping in his bed like normal  We added losartan to his HCTZ at his last visit in May due to elevated BP-his blood pressures under better control though he feels losartan may cause him to feel drowsy We reviewed his blood pressure medications today  BP Readings from Last 3 Encounters:  04/28/20 134/82  02/15/20 (!) 150/90  01/21/20 (!) 151/97     Patient Active Problem List   Diagnosis Date Noted  . Prediabetes  02/14/2020  . Prostate cancer screening 04/29/2016  . Visit for preventive health examination 04/29/2016  . Colon cancer screening 04/29/2016  . Acute deep vein thrombosis (DVT) of left lower extremity (HCC) 02/05/2016  . Acute pulmonary embolism (HCC) 02/04/2016  . Lung nodule 02/04/2016  . Obesity 02/04/2016  . Idiopathic hypothyroidism 02/09/2015  . Edema of both legs 02/09/2015  . Sleep apnea 03/21/2013  . Hypertension, benign 03/21/2013    Past Medical History:  Diagnosis Date  . Deep vein thrombosis (DVT) of left lower extremity (HCC)   . GERD (gastroesophageal reflux disease)   . History of chicken pox   . Hypertension   . Measles   . Pulmonary embolism (HCC)   . Sleep apnea   . Thyroid disease    Unfounded    Past Surgical History:  Procedure Laterality Date  . COLONOSCOPY WITH PROPOFOL N/A 11/29/2016   Procedure: COLONOSCOPY WITH PROPOFOL;  Surgeon: Bernette Redbird, MD;  Location: [facility];  Service: Endoscopy;  Laterality: N/A;  . FOOT SURGERY     Chain saw accident    Social History   Tobacco Use  . Smoking status: Never Smoker  . Smokeless tobacco: Never Used  Vaping Use  . Vaping Use: Never used  Substance Use Topics  . Alcohol use: No    Alcohol/week: 0.0 standard drinks  . Drug use: No    Family History  Problem  Relation Age of Onset  . Hypertension Father        Living  . Heart disease Mother        Living  . Pulmonary embolism Mother   . Anemia Mother   . Hypertension Other        Paternal Side  . Hypertension Sister        x2  . Migraines Sister   . Allergies Son        x1    No Known Allergies  Medication list has been reviewed and updated.  Current Outpatient Medications on File Prior to Visit  Medication Sig Dispense Refill  . aspirin EC 81 MG tablet Take 81 mg by mouth daily. Takes two 81 mg tablets in morning.    . carvedilol (COREG) 12.5 MG tablet TAKE 1 TABLET BY MOUTH AT BEDTIME 90 tablet 1  . furosemide (LASIX) 40  MG tablet TAKE 1 TABLET BY MOUTH TWICE DAILY 60 tablet 5  . levothyroxine (SYNTHROID) 25 MCG tablet Take 1 tablet (25 mcg total) by mouth daily before breakfast. 30 tablet 6  . losartan-hydrochlorothiazide (HYZAAR) 50-12.5 MG tablet Take 1 tablet by mouth daily. 30 tablet 6  . potassium chloride SA (KLOR-CON) 20 MEQ tablet TAKE 1 TABLET BY MOUTH DAILY 30 tablet 5   No current facility-administered medications on file prior to visit.    Review of Systems:  As per HPI- otherwise negative.   Physical Examination: Vitals:   04/28/20 1320  BP: 134/82  Pulse: 64  Resp: 18  Temp: 98.1 F (36.7 C)  SpO2: 98%   Vitals:   04/28/20 1320  Weight: (!) 361 lb (163.7 kg)  Height: 6' 0.5" (1.842 m)   Body mass index is 48.29 kg/m. Ideal Body Weight: Weight in (lb) to have BMI = 25: 186.5  GEN: no acute distress.  Obese, otherwise looks well HEENT: Atraumatic, Normocephalic.  Ears and Nose: No external deformity. CV: RRR, No M/G/R. No JVD. No thrill. No extra heart sounds. PULM: CTA B, no wheezes, crackles, rhonchi. No retractions. No resp. distress. No accessory muscle use.Marland Kitchen EXTR: No c/c/e PSYCH: Normally interactive. Conversant.  Normal strength and perfusion of both upper extremities.  The patient notes a feeling of partial numbness at the bilateral ring and pinky fingers.  No weakness of the hands or wrists   Assessment and Plan: Ulnar neuropathy of both upper extremities - Plan: predniSONE (DELTASONE) 20 MG tablet  Essential hypertension  Blood pressure under okay control, discussed his current medications.  Typically losartan does not cause fatigue, I would encourage him to try and keep taking this if he can  I suspect he developed an ulnar neuropathy from sleeping in a chair with his elbows resting on a hard armrest all night.  Because his symptoms have continued for 2 to 3 weeks we will go ahead and try a course of prednisone.  I have asked him to let me know if he is not  significantly improved within a few days, sooner if worse This visit occurred during the SARS-CoV-2 public health emergency.  Safety protocols were in place, including screening questions prior to the visit, additional usage of staff PPE, and extensive cleaning of exam room while observing appropriate contact time as indicated for disinfecting solutions.   Signed Abbe Amsterdam, MD

## 2020-04-28 NOTE — Patient Instructions (Addendum)
It was good to see you again today! I think you have ulnar neuropathy or compression/ temporary malfunction of the ulnar nerve, likely from sleeping in a chair recently We will try treating you with prednisone for 8 days. Please let me know if this is not resolved in a week or so- Sooner if worse.

## 2020-05-25 NOTE — Progress Notes (Signed)
[facility] [address] [place], [place] [address] [phone]  Date:  05/26/2020   Name:  [name]   DOB:  08/16/1961   MRN:  [id number]  PCP:  Pearline Cables, MD    Chief Complaint: Leg Pain (left lower leg swelling, redness, painful) and Hand Numbing   History of Present Illness:  [name] is a 59 y.o. very pleasant male patient who presents with the following:  Patient with history of hypertension, DVT, hypothyroidism, sleep apnea, obesity, prediabetes  Patient here today with concern of bilateral ulnar distribution hand numbness Last seen by myself about 1 month ago with numbness and weakness in both hands, seemed to start after he spent a week (away from home on vacation ) sleeping in a recliner with his forearms resting on hard wooden armrest.  We tried a course of prednisone for presumed ulnar neuropathy-unfortunately this did not resolve.  The symptoms persist, although it is somewhat less severe  He has persistent lower extremity swelling and history of idiopathic DVT.  He has been evaluated by vascular surgery who thought his swelling was due to venous insufficiency and lymphedema, exacerbated by central obesity  COVID-19 series Shingrix BMP, A1c done in May  He has noted some increased swelling and discomfort in his left calf recently-  No feeling ill, no fever in general However, he notes that the leg has been somewhat warm.  He wonders if he may have cellulitis.  He does also have history of DVT Patient Active Problem List   Diagnosis Date Noted   Prediabetes 02/14/2020   Prostate cancer screening 04/29/2016   Visit for preventive health examination 04/29/2016   Colon cancer screening 04/29/2016   Acute deep vein thrombosis (DVT) of left lower extremity (HCC) 02/05/2016   Acute pulmonary embolism (HCC) 02/04/2016   Lung nodule 02/04/2016   Obesity 02/04/2016    Idiopathic hypothyroidism 02/09/2015   Edema of both legs 02/09/2015   Sleep apnea 03/21/2013   Hypertension, benign 03/21/2013    Past Medical History:  Diagnosis Date   Deep vein thrombosis (DVT) of left lower extremity (HCC)    GERD (gastroesophageal reflux disease)    History of chicken pox    Hypertension    Measles    Pulmonary embolism (HCC)    Sleep apnea    Thyroid disease    Unfounded    Past Surgical History:  Procedure Laterality Date   COLONOSCOPY WITH PROPOFOL N/A 11/29/2016   Procedure: COLONOSCOPY WITH PROPOFOL;  Surgeon: Bernette Redbird, MD;  Location: [facility];  Service: Endoscopy;  Laterality: N/A;   FOOT SURGERY     Chain saw accident    Social History   Tobacco Use   Smoking status: Never Smoker   Smokeless tobacco: Never Used  Vaping Use   Vaping Use: Never used  Substance Use Topics   Alcohol use: No    Alcohol/week: 0.0 standard drinks   Drug use: No    Family History  Problem Relation Age of Onset   Hypertension Father        Living   Heart disease Mother        Living   Pulmonary embolism Mother    Anemia Mother    Hypertension Other        Paternal Side   Hypertension Sister        x2   Migraines Sister    Allergies Son  x1    No Known Allergies  Medication list has been reviewed and updated.  Current Outpatient Medications on File Prior to Visit  Medication Sig Dispense Refill   aspirin EC 81 MG tablet Take 81 mg by mouth daily. Takes two 81 mg tablets in morning.     carvedilol (COREG) 12.5 MG tablet TAKE 1 TABLET BY MOUTH AT BEDTIME 90 tablet 1   furosemide (LASIX) 40 MG tablet TAKE 1 TABLET BY MOUTH TWICE DAILY 60 tablet 5   levothyroxine (SYNTHROID) 25 MCG tablet Take 1 tablet (25 mcg total) by mouth daily before breakfast. 30 tablet 6   losartan-hydrochlorothiazide (HYZAAR) 50-12.5 MG tablet Take 1 tablet by mouth daily. 30 tablet 6   potassium chloride SA (KLOR-CON) 20 MEQ  tablet TAKE 1 TABLET BY MOUTH DAILY 30 tablet 5   No current facility-administered medications on file prior to visit.    Review of Systems:  As per HPI- otherwise negative.   Physical Examination: Vitals:   05/26/20 1115  BP: (!) 148/82  Pulse: 88  Resp: 19  Temp: 98.2 F (36.8 C)  SpO2: 97%   Vitals:   05/26/20 1115  Weight: (!) 363 lb (164.7 kg)  Height: 6' 0.5" (1.842 m)   Body mass index is 48.55 kg/m. Ideal Body Weight: Weight in (lb) to have BMI = 25: 186.5  GEN: no acute distress.  Morbid obesity, otherwise looks well HEENT: Atraumatic, Normocephalic.  Ears and Nose: No external deformity. CV: RRR, No M/G/R. No JVD. No thrill. No extra heart sounds. PULM: CTA B, no wheezes, crackles, rhonchi. No retractions. No resp. distress. No accessory muscle use.Marland Kitchen EXTR: No c/c/e PSYCH: Normally interactive. Conversant.  Normal grip strength in bilateral hands.  Patient indicates some decrease sensation in the fourth and fifth fingers bilaterally, this radiates into the distal portion of the forearm, not up as far as the elbow. The left calf is edematous which is baseline per patient, however perhaps more pronounced than normal.  There is some slight erythema and tenderness of the soft tissues There is no skin breakdown or ulceration of the foot   Assessment and Plan: Cellulitis of left lower extremity - Plan: amoxicillin-clavulanate (AUGMENTIN) 875-125 MG tablet  Ulnar neuropathy of both upper extremities - Plan: Ambulatory referral to Neurology, Ambulatory referral to Orthopedic Surgery  History of DVT (deep vein thrombosis) - Plan: US Venous Img Lower Unilateral Left    Patient here today with a couple of concerns.  He continues to have symptoms of bilateral ulnar neuropathy.  This is not improved with a moderate amount of time.  Will refer to neurology and also orthopedics to help [place] work-up this issue I have asked him to let me know if this is getting worse in any  way Concern of cellulitis in the left calf due to chronic edema.  Will start on Augmentin Would also like to get an ultrasound to rule out DVT.  It is proven to be difficult to find this patient appointment.  We will continue to work on this This visit occurred during the SARS-CoV-2 public health emergency.  Safety protocols were in place, including screening questions prior to the visit, additional usage of staff PPE, and extensive cleaning of exam room while observing appropriate contact time as indicated for disinfecting solutions.    Signed Abbe Amsterdam, MD

## 2020-05-25 NOTE — Patient Instructions (Addendum)
It was good to see you again today!  Please get your COVID-19 immunization series if not done already; I am afraid you are at high risk for serious Covid infection or death if you were to contract this virus.   We are going to treat you for presumed cellulitis of the left leg with augmentin twice a day for 10 days We are working on getting a left leg ultrasound for you; imaging should call you later today.  Please let me know if you don't hear from them! I am going to set you up with neurology and also orthopedics for your ulnar nerve issue

## 2020-05-26 ENCOUNTER — Ambulatory Visit: Payer: BC Managed Care – PPO | Admitting: Family Medicine

## 2020-05-26 ENCOUNTER — Other Ambulatory Visit: Payer: Self-pay

## 2020-05-26 VITALS — BP 148/82 | HR 88 | Temp 98.2°F | Resp 19 | Ht 72.5 in | Wt 363.0 lb

## 2020-05-26 DIAGNOSIS — Z86718 Personal history of other venous thrombosis and embolism: Secondary | ICD-10-CM | POA: Diagnosis not present

## 2020-05-26 DIAGNOSIS — L03116 Cellulitis of left lower limb: Secondary | ICD-10-CM

## 2020-05-26 DIAGNOSIS — G5623 Lesion of ulnar nerve, bilateral upper limbs: Secondary | ICD-10-CM

## 2020-05-26 MED ORDER — AMOXICILLIN-POT CLAVULANATE 875-125 MG PO TABS: 1.0000 | ORAL_TABLET | Freq: Two times a day (BID) | ORAL | 0 refills | Status: DC

## 2020-05-28 ENCOUNTER — Other Ambulatory Visit: Payer: Self-pay

## 2020-05-28 ENCOUNTER — Ambulatory Visit (HOSPITAL_BASED_OUTPATIENT_CLINIC_OR_DEPARTMENT_OTHER)
Admission: RE | Admit: 2020-05-28 | Discharge: 2020-05-28 | Disposition: A | Discharge status: Home / Self Care | Payer: BC Managed Care – PPO | Source: Ambulatory Visit | Attending: Family Medicine | Admitting: Family Medicine

## 2020-05-28 DIAGNOSIS — Z86718 Personal history of other venous thrombosis and embolism: Secondary | ICD-10-CM | POA: Insufficient documentation

## 2020-05-28 DIAGNOSIS — R600 Localized edema: Secondary | ICD-10-CM | POA: Diagnosis not present

## 2020-05-29 ENCOUNTER — Other Ambulatory Visit (HOSPITAL_BASED_OUTPATIENT_CLINIC_OR_DEPARTMENT_OTHER): Payer: BC Managed Care – PPO

## 2020-06-06 ENCOUNTER — Encounter: Payer: Self-pay | Admitting: Family Medicine

## 2020-06-06 ENCOUNTER — Ambulatory Visit: Payer: BC Managed Care – PPO | Admitting: Family Medicine

## 2020-06-06 ENCOUNTER — Other Ambulatory Visit: Payer: Self-pay

## 2020-06-06 DIAGNOSIS — R202 Paresthesia of skin: Secondary | ICD-10-CM

## 2020-06-06 DIAGNOSIS — R200 Anesthesia of skin: Secondary | ICD-10-CM | POA: Diagnosis not present

## 2020-06-06 NOTE — Patient Instructions (Signed)
    B-Complex Vitamin once daily.  Call me in one month with a progress report.

## 2020-06-06 NOTE — Progress Notes (Signed)
Office Visit Note   Patient: [name]           Date of Birth: 1961/01/27           MRN: [id number] Visit Date: 06/06/2020 Requested by: Pearline Cables, MD [address] [place],  [place] [address] PCP: Pearline Cables, MD  Subjective: Chief Complaint  Patient presents with   Left Hand - Numbness   Right Hand - Numbness    HPI: He is here with bilateral hand numbness.  Symptoms started about 2 months ago.  He was on vacation and forgot his CPAP machine.  He had to sleep in a recliner with hard armrests.  He started having numbness in both hands in the fourth and fifth fingers and it has persisted to this day.  He has not noticed much in the way of pain, denies any neck pain.  He has a little bit of weakness with grip strength but not a lot.  He is right-hand dominant.  No previous problems with his hands.  No history of diabetes.  He is not taking any medications for his symptoms.              ROS:   All other systems were reviewed and are negative.  Objective: Vital Signs: There were no vitals taken for this visit.  Physical Exam:  General:  Alert and oriented, in no acute distress. Pulm:  Breathing unlabored. Psy:  Normal mood, congruent affect. Skin: No rash Arms: He has full neck range of motion with negative Spurling's test.  5/5 deltoid, rotator cuff, biceps, triceps, wrist and intrinsic hand strength.  Positive Tinel's at the left ulnar groove, negative on the right.  No subluxation of the ulnar nerves at the elbows.  No tenderness and negative Tinel's at Guyon's canal.  No atrophy of the hypothenar muscles.  Imaging: No results found.  Assessment & Plan: 1.  Bilateral hand ulnar nerve distribution numbness, suspect due to contusion of ulnar nerves at the elbows. -We will try B complex vitamin once daily for the next month.  If symptoms persist he will call and I will order bilateral upper extremity nerve conduction  studies.     Procedures: No procedures performed  No notes on file     PMFS History: Patient Active Problem List   Diagnosis Date Noted   Prediabetes 02/14/2020   Prostate cancer screening 04/29/2016   Visit for preventive health examination 04/29/2016   Colon cancer screening 04/29/2016   Acute deep vein thrombosis (DVT) of left lower extremity (HCC) 02/05/2016   Acute pulmonary embolism (HCC) 02/04/2016   Lung nodule 02/04/2016   Obesity 02/04/2016   Idiopathic hypothyroidism 02/09/2015   Edema of both legs 02/09/2015   Sleep apnea 03/21/2013   Hypertension, benign 03/21/2013   Past Medical History:  Diagnosis Date   Deep vein thrombosis (DVT) of left lower extremity (HCC)    GERD (gastroesophageal reflux disease)    History of chicken pox    Hypertension    Measles    Pulmonary embolism (HCC)    Sleep apnea    Thyroid disease    Unfounded    Family History  Problem Relation Age of Onset   Hypertension Father        Living   Heart disease Mother        Living   Pulmonary embolism Mother    Anemia Mother    Hypertension Other  Paternal Side   Hypertension Sister        x2   Migraines Sister    Allergies Son        x1    Past Surgical History:  Procedure Laterality Date   COLONOSCOPY WITH PROPOFOL N/A 11/29/2016   Procedure: COLONOSCOPY WITH PROPOFOL;  Surgeon: Bernette Redbird, MD;  Location: [facility];  Service: Endoscopy;  Laterality: N/A;   FOOT SURGERY     Chain saw accident   Social History   Occupational History   Not on file  Tobacco Use   Smoking status: Never Smoker   Smokeless tobacco: Never Used  Vaping Use   Vaping Use: Never used  Substance and Sexual Activity   Alcohol use: No    Alcohol/week: 0.0 standard drinks   Drug use: No   Sexual activity: Never

## 2020-06-06 NOTE — Progress Notes (Signed)
Bilateral hand numbness for 2 months Prednisone didn't help

## 2020-06-17 ENCOUNTER — Other Ambulatory Visit: Payer: Self-pay | Admitting: Family Medicine

## 2020-06-17 DIAGNOSIS — R7989 Other specified abnormal findings of blood chemistry: Secondary | ICD-10-CM

## 2020-06-17 DIAGNOSIS — I10 Essential (primary) hypertension: Secondary | ICD-10-CM

## 2020-06-17 DIAGNOSIS — E039 Hypothyroidism, unspecified: Secondary | ICD-10-CM

## 2020-06-17 MED ORDER — LOSARTAN POTASSIUM-HCTZ 50-12.5 MG PO TABS: 1.0000 | ORAL_TABLET | Freq: Every day | ORAL | 0 refills | Status: DC

## 2020-06-21 ENCOUNTER — Telehealth: Payer: Self-pay | Admitting: Family Medicine

## 2020-06-21 DIAGNOSIS — I10 Essential (primary) hypertension: Secondary | ICD-10-CM

## 2020-06-23 NOTE — Telephone Encounter (Signed)
Was d/c by you on 02/15/20.  Do you want to refill?

## 2020-06-24 MED ORDER — HYDROCHLOROTHIAZIDE 12.5 MG PO CAPS: 12.5000 mg | ORAL_CAPSULE | Freq: Every day | ORAL | 3 refills | Status: DC

## 2020-06-24 NOTE — Addendum Note (Signed)
Addended by: Abbe Amsterdam C on: 06/24/2020 01:38 PM   Modules accepted: Orders

## 2020-06-24 NOTE — Telephone Encounter (Signed)
Call back # [phone]  Patient called back stating he still taking medication, He does not remember you taking him off.

## 2020-06-24 NOTE — Telephone Encounter (Signed)
Called to clarify with him- he has been taking hctz 12.5 AND losartan/hct 50/12.5, as well as lasix and K supplement Will continue hctz as his BP has not been too low  BP Readings from Last 3 Encounters:  05/26/20 (!) 148/82  04/28/20 134/82  02/15/20 (!) 150/90   Recent K in ragne

## 2020-06-24 NOTE — Telephone Encounter (Signed)
Called pt and LMOM- I think this refill request was in error- let me know if he does need it JC

## 2020-07-25 ENCOUNTER — Other Ambulatory Visit: Payer: Self-pay | Admitting: Family Medicine

## 2020-08-15 ENCOUNTER — Other Ambulatory Visit: Payer: Self-pay

## 2020-08-15 ENCOUNTER — Encounter: Payer: Self-pay | Admitting: Family Medicine

## 2020-08-15 ENCOUNTER — Ambulatory Visit: Payer: BC Managed Care – PPO | Admitting: Family Medicine

## 2020-08-15 DIAGNOSIS — R202 Paresthesia of skin: Secondary | ICD-10-CM | POA: Diagnosis not present

## 2020-08-15 DIAGNOSIS — R200 Anesthesia of skin: Secondary | ICD-10-CM

## 2020-08-15 NOTE — Progress Notes (Signed)
Office Visit Note   Patient: [name]           Date of Birth: 1961-08-23           MRN: [id number] Visit Date: 08/15/2020 Requested by: Pearline Cables, MD [address] [place],  [place] [address] PCP: Pearline Cables, MD  Subjective: Chief Complaint  Patient presents with  . Right Hand - Numbness    Numbness and tingling in both hands since June. Did start Vitamin B complex daily after the August ov  - maybe a little improvement, but the colder weather makes it worse. Right-hand dominant.  . Left Hand - Numbness    HPI: He is here with persistent bilateral hand numbness. Ongoing numbness in the fourth and fifth fingers of both hands. He has not noticed any weakness. Denies any neck pain. The right hand is possibly a little bit worse than the left. He took a B complex vitamin with no improvement.              ROS:   All other systems were reviewed and are negative.  Objective: Vital Signs: There were no vitals taken for this visit.  Physical Exam:  General:  Alert and oriented, in no acute distress. Pulm:  Breathing unlabored. Psy:  Normal mood, congruent affect.  Neck range of motion is good. He has 5/5 upper extremity strength bilaterally including intrinsic hand strength. Negative Tinel's at the carpal tunnel bilaterally, equivocal at the ulnar groove on the left. Negative Phalen's test, negative ulnar compression at the elbow.   Imaging: No results found.  Assessment & Plan: 1. Persistent bilateral hand numbness, in the ulnar distribution, etiology uncertain. -Nerve studies to further evaluate.     Procedures: No procedures performed  No notes on file     PMFS History: Patient Active Problem List   Diagnosis Date Noted  . Prediabetes 02/14/2020  . Prostate cancer screening 04/29/2016  . Visit for preventive health examination 04/29/2016  . Colon cancer screening 04/29/2016  . Acute deep vein thrombosis (DVT) of left lower  extremity (HCC) 02/05/2016  . Acute pulmonary embolism (HCC) 02/04/2016  . Lung nodule 02/04/2016  . Obesity 02/04/2016  . Idiopathic hypothyroidism 02/09/2015  . Edema of both legs 02/09/2015  . Sleep apnea 03/21/2013  . Hypertension, benign 03/21/2013   Past Medical History:  Diagnosis Date  . Deep vein thrombosis (DVT) of left lower extremity (HCC)   . GERD (gastroesophageal reflux disease)   . History of chicken pox   . Hypertension   . Measles   . Pulmonary embolism (HCC)   . Sleep apnea   . Thyroid disease    Unfounded    Family History  Problem Relation Age of Onset  . Hypertension Father        Living  . Heart disease Mother        Living  . Pulmonary embolism Mother   . Anemia Mother   . Hypertension Other        Paternal Side  . Hypertension Sister        x2  . Migraines Sister   . Allergies Son        x1    Past Surgical History:  Procedure Laterality Date  . COLONOSCOPY WITH PROPOFOL N/A 11/29/2016   Procedure: COLONOSCOPY WITH PROPOFOL;  Surgeon: Bernette Redbird, MD;  Location: [facility];  Service: Endoscopy;  Laterality: N/A;  . FOOT SURGERY     Chain saw  accident   Social History   Occupational History  . Not on file  Tobacco Use  . Smoking status: Never Smoker  . Smokeless tobacco: Never Used  Vaping Use  . Vaping Use: Never used  Substance and Sexual Activity  . Alcohol use: No    Alcohol/week: 0.0 standard drinks  . Drug use: No  . Sexual activity: Never

## 2020-08-17 ENCOUNTER — Ambulatory Visit: Payer: BC Managed Care – PPO | Admitting: Family Medicine

## 2020-08-23 ENCOUNTER — Other Ambulatory Visit: Payer: Self-pay | Admitting: Family Medicine

## 2020-08-23 DIAGNOSIS — R600 Localized edema: Secondary | ICD-10-CM

## 2020-08-31 NOTE — Progress Notes (Addendum)
[facility] [address] [place], [place] [address] [phone]  Date:  09/01/2020   Name:  [name]   DOB:  12/14/1960   MRN:  [id number]  PCP:  Pearline Cables, MD    Chief Complaint: Hypertension (6 months follow up) and Ulnar Neuropathy   History of Present Illness:  [name] is a 59 y.o. very pleasant male patient who presents with the following:  Patient here today for 10-month follow-up visit- history of hypertension, DVT, hypothyroidism, sleep apnea, obesity, prediabetes Last seen by myself in August of this year with bilateral ulnar distribution hand numbness This was persistent, he was referred to orthopedics-most recent visit November 1. They plan to get EMG studies.   This will be done 12/13- his sx are not any better as of yet As long as he is distracted he does not notice it too much He notes weakness in the hands but no pain He will let me know what the nerve studies show and we will refer to neurology if necessary  Sx are the same in both arms   He tried taking B12 for numbness but no help  COVID-19 series-we discussed this today.  I encouraged him these vaccines are safe and effective and I would recommend that he get this done to reduce risk of serious morbidity and death from COVID-19 Flu shot- not done  Colon cancer screening up-to-date BMP, A1c completed in May  Aspirin 81 Carvedilol Lasix 40 twice daily HCTZ Synthroid Losartan HCTZ Potassium  He does not check his BP at home generally  He also has noticed a small area of possible cellulitis on his left lower leg.  He tends to have chronic lower extremity edema.  He hit his leg against an object about a week ago, has noticed some redness and tenderness in the area of injury.  Wonders about possible infection His tetanus is up-to-date Patient Active Problem List   Diagnosis Date Noted  . Prediabetes 02/14/2020  .  Prostate cancer screening 04/29/2016  . Visit for preventive health examination 04/29/2016  . Colon cancer screening 04/29/2016  . Acute deep vein thrombosis (DVT) of left lower extremity (HCC) 02/05/2016  . Acute pulmonary embolism (HCC) 02/04/2016  . Lung nodule 02/04/2016  . Obesity 02/04/2016  . Idiopathic hypothyroidism 02/09/2015  . Edema of both legs 02/09/2015  . Sleep apnea 03/21/2013  . Hypertension, benign 03/21/2013    Past Medical History:  Diagnosis Date  . Deep vein thrombosis (DVT) of left lower extremity (HCC)   . GERD (gastroesophageal reflux disease)   . History of chicken pox   . Hypertension   . Measles   . Pulmonary embolism (HCC)   . Sleep apnea   . Thyroid disease    Unfounded    Past Surgical History:  Procedure Laterality Date  . COLONOSCOPY WITH PROPOFOL N/A 11/29/2016   Procedure: COLONOSCOPY WITH PROPOFOL;  Surgeon: Bernette Redbird, MD;  Location: [facility];  Service: Endoscopy;  Laterality: N/A;  . FOOT SURGERY     Chain saw accident    Social History   Tobacco Use  . Smoking status: Never Smoker  . Smokeless tobacco: Never Used  Vaping Use  . Vaping Use: Never used  Substance Use Topics  . Alcohol use: No    Alcohol/week: 0.0 standard drinks  . Drug use: No    Family History  Problem Relation Age of Onset  .  Hypertension Father        Living  . Heart disease Mother        Living  . Pulmonary embolism Mother   . Anemia Mother   . Hypertension Other        Paternal Side  . Hypertension Sister        x2  . Migraines Sister   . Allergies Son        x1    No Known Allergies  Medication list has been reviewed and updated.  Current Outpatient Medications on File Prior to Visit  Medication Sig Dispense Refill  . aspirin EC 81 MG tablet Take 81 mg by mouth daily. Takes two 81 mg tablets in morning.    Marland Kitchen b complex vitamins capsule Take 1 capsule by mouth daily.    . carvedilol (COREG) 12.5 MG tablet TAKE 1 TABLET BY MOUTH  AT BEDTIME 90 tablet 1  . furosemide (LASIX) 40 MG tablet Take 1 tablet (40 mg total) by mouth 2 (two) times daily. 60 tablet 5  . hydrochlorothiazide (MICROZIDE) 12.5 MG capsule Take 1 capsule (12.5 mg total) by mouth daily. 90 capsule 3  . levothyroxine (SYNTHROID) 25 MCG tablet Take 1 tablet (25 mcg total) by mouth daily before breakfast. 90 tablet 0  . losartan-hydrochlorothiazide (HYZAAR) 50-12.5 MG tablet Take 1 tablet by mouth daily. 90 tablet 0  . potassium chloride SA (KLOR-CON) 20 MEQ tablet Take 1 tablet (20 mEq total) by mouth daily. 30 tablet 5   No current facility-administered medications on file prior to visit.    Review of Systems:  As per HPI- otherwise negative.   Physical Examination: Vitals:   09/01/20 1549 09/01/20 1609  BP: (!) 144/90 140/90  Pulse: 65   Resp: 18   Temp: 98 F (36.7 C)   SpO2: 97%    Vitals:   09/01/20 1549  Weight: (!) 361 lb (163.7 kg)  Height: 6' 0.5" (1.842 m)   Body mass index is 48.29 kg/m. Ideal Body Weight: Weight in (lb) to have BMI = 25: 186.5  GEN: no acute distress.  Obese, otherwise looks well HEENT: Atraumatic, Normocephalic.  Ears and Nose: No external deformity. CV: RRR, No M/G/R. No JVD. No thrill. No extra heart sounds. PULM: CTA B, no wheezes, crackles, rhonchi. No retractions. No resp. distress. No accessory muscle use. ABD: S, NT, ND, +BS. No rebound. No HSM. EXTR: No c/c/chronic lower extremity edema is stable, worse on the left.  The left lateral shin displays a small area of likely cellulitis, mild PSYCH: Normally interactive. Conversant.    Assessment and Plan: Essential hypertension - Plan: CBC, Comprehensive metabolic panel  Abnormal TSH - Plan: TSH  Morbid obesity (HCC)  Prediabetes - Plan: Hemoglobin A1c  Screening for prostate cancer - Plan: PSA  Idiopathic hypothyroidism - Plan: TSH  Screening for hyperlipidemia - Plan: Lipid panel  Cellulitis of left leg - Plan: cephALEXin (KEFLEX) 500  MG capsule  Leg swelling  Periodic follow-up visit today.  Blood pressures under reasonable control, continue current regimen He is taking significant diuretic therapy for lower extremity edema, check CMP today Follow-up on hypothyroidism, check TSH PSA to screen for prostate cancer We will treat for likely cellulitis of the left lower extremity with Keflex-I asked him to let me know if this is not getting better in the next couple of days, sooner if worse. Will plan further follow- up pending labs. Plan to visit in 6 months This visit occurred during the SARS-CoV-2  public health emergency.  Safety protocols were in place, including screening questions prior to the visit, additional usage of staff PPE, and extensive cleaning of exam room while observing appropriate contact time as indicated for disinfecting solutions.    Signed Abbe Amsterdam, MD  Received her labs 11/19- letter to pt A1c stable Results for orders placed or performed in visit on 09/01/20  CBC  Result Value Ref Range   WBC 7.4 4.0 - 10.5 K/uL   RBC 4.57 4.22 - 5.81 Mil/uL   Platelets 234.0 150 - 400 K/uL   Hemoglobin 14.9 13.0 - 17.0 g/dL   HCT 46.2 39 - 52 %   MCV 92.7 78.0 - 100.0 fl   MCHC 35.1 30.0 - 36.0 g/dL   RDW 70.3 50.0 - 93.8 %  Comprehensive metabolic panel  Result Value Ref Range   Sodium 141 135 - 145 mEq/L   Potassium 4.5 3.5 - 5.1 mEq/L   Chloride 100 96 - 112 mEq/L   CO2 31 19 - 32 mEq/L   Glucose, Bld 103 (H) 70 - 99 mg/dL   BUN 21 6 - 23 mg/dL   Creatinine, Ser 1.82 0.40 - 1.50 mg/dL   Total Bilirubin 0.6 0.2 - 1.2 mg/dL   Alkaline Phosphatase 83 39 - 117 U/L   AST 20 0 - 37 U/L   ALT 25 0 - 53 U/L   Total Protein 7.2 6.0 - 8.3 g/dL   Albumin 4.4 3.5 - 5.2 g/dL   GFR 99.37 >16.96 mL/min   Calcium 9.4 8.4 - 10.5 mg/dL  Lipid panel  Result Value Ref Range   Cholesterol 169 0 - 200 mg/dL   Triglycerides 789.3 (H) 0 - 149 mg/dL   HDL 81.01 >75.10 mg/dL   VLDL 25.8 0.0 - 52.7 mg/dL    LDL Cholesterol 94 0 - 99 mg/dL   Total CHOL/HDL Ratio 4    NonHDL 124.30   TSH  Result Value Ref Range   TSH 4.03 0.35 - 4.50 uIU/mL  PSA  Result Value Ref Range   PSA 0.20 0.10 - 4.00 ng/mL  Hemoglobin A1c  Result Value Ref Range   Hgb A1c MFr Bld 6.2 4.6 - 6.5 %

## 2020-09-01 ENCOUNTER — Ambulatory Visit: Payer: BC Managed Care – PPO | Admitting: Family Medicine

## 2020-09-01 ENCOUNTER — Encounter: Payer: Self-pay | Admitting: Family Medicine

## 2020-09-01 ENCOUNTER — Other Ambulatory Visit: Payer: Self-pay

## 2020-09-01 VITALS — BP 140/90 | HR 65 | Temp 98.0°F | Resp 18 | Ht 72.5 in | Wt 361.0 lb

## 2020-09-01 DIAGNOSIS — Z125 Encounter for screening for malignant neoplasm of prostate: Secondary | ICD-10-CM

## 2020-09-01 DIAGNOSIS — R7989 Other specified abnormal findings of blood chemistry: Secondary | ICD-10-CM | POA: Diagnosis not present

## 2020-09-01 DIAGNOSIS — I10 Essential (primary) hypertension: Secondary | ICD-10-CM | POA: Diagnosis not present

## 2020-09-01 DIAGNOSIS — Z13220 Encounter for screening for lipoid disorders: Secondary | ICD-10-CM | POA: Diagnosis not present

## 2020-09-01 DIAGNOSIS — E039 Hypothyroidism, unspecified: Secondary | ICD-10-CM

## 2020-09-01 DIAGNOSIS — L03116 Cellulitis of left lower limb: Secondary | ICD-10-CM

## 2020-09-01 DIAGNOSIS — M7989 Other specified soft tissue disorders: Secondary | ICD-10-CM

## 2020-09-01 DIAGNOSIS — R7303 Prediabetes: Secondary | ICD-10-CM | POA: Diagnosis not present

## 2020-09-01 MED ORDER — CEPHALEXIN 500 MG PO CAPS: 500.0000 mg | ORAL_CAPSULE | Freq: Three times a day (TID) | ORAL | 0 refills | Status: DC

## 2020-09-01 NOTE — Patient Instructions (Signed)
Good to see you again today- I will be in touch with your labs Please use keflex three times a day for 5- 10 days for the infection on your left shin Assuming all is well please see me in 6 months

## 2020-09-02 LAB — CBC
HCT: 42.4 % (ref 39.0–52.0)
Hemoglobin: 14.9 g/dL (ref 13.0–17.0)
MCHC: 35.1 g/dL (ref 30.0–36.0)
MCV: 92.7 fl (ref 78.0–100.0)
Platelets: 234 10*3/uL (ref 150.0–400.0)
RBC: 4.57 Mil/uL (ref 4.22–5.81)
RDW: 12.9 % (ref 11.5–15.5)
WBC: 7.4 10*3/uL (ref 4.0–10.5)

## 2020-09-02 LAB — COMPREHENSIVE METABOLIC PANEL
ALT: 25 U/L (ref 0–53)
AST: 20 U/L (ref 0–37)
Albumin: 4.4 g/dL (ref 3.5–5.2)
Alkaline Phosphatase: 83 U/L (ref 39–117)
BUN: 21 mg/dL (ref 6–23)
CO2: 31 mEq/L (ref 19–32)
Calcium: 9.4 mg/dL (ref 8.4–10.5)
Chloride: 100 mEq/L (ref 96–112)
Creatinine, Ser: 1.08 mg/dL (ref 0.40–1.50)
GFR: 75.4 mL/min (ref >60.00)
Glucose, Bld: 103 mg/dL — ABNORMAL HIGH (ref 70–99)
Potassium: 4.5 mEq/L (ref 3.5–5.1)
Sodium: 141 mEq/L (ref 135–145)
Total Bilirubin: 0.6 mg/dL (ref 0.2–1.2)
Total Protein: 7.2 g/dL (ref 6.0–8.3)

## 2020-09-02 LAB — PSA: PSA: 0.2 ng/mL (ref 0.10–4.00)

## 2020-09-02 LAB — LIPID PANEL
Cholesterol: 169 mg/dL (ref 0–200)
HDL: 44.7 mg/dL (ref >39.00)
LDL Cholesterol: 94 mg/dL (ref 0–99)
NonHDL: 124.3
Total CHOL/HDL Ratio: 4
Triglycerides: 150 mg/dL — ABNORMAL HIGH (ref 0.0–149.0)
VLDL: 30 mg/dL (ref 0.0–40.0)

## 2020-09-02 LAB — HEMOGLOBIN A1C: Hgb A1c MFr Bld: 6.2 % (ref 4.6–6.5)

## 2020-09-02 LAB — TSH: TSH: 4.03 u[IU]/mL (ref 0.35–4.50)

## 2020-09-23 ENCOUNTER — Other Ambulatory Visit: Payer: Self-pay | Admitting: Family Medicine

## 2020-09-23 DIAGNOSIS — R7989 Other specified abnormal findings of blood chemistry: Secondary | ICD-10-CM

## 2020-09-23 DIAGNOSIS — E039 Hypothyroidism, unspecified: Secondary | ICD-10-CM

## 2020-09-26 ENCOUNTER — Encounter: Payer: Self-pay | Admitting: Physical Medicine and Rehabilitation

## 2020-09-26 ENCOUNTER — Ambulatory Visit: Payer: BC Managed Care – PPO | Admitting: Physical Medicine and Rehabilitation

## 2020-09-26 ENCOUNTER — Other Ambulatory Visit: Payer: Self-pay

## 2020-09-26 DIAGNOSIS — R202 Paresthesia of skin: Secondary | ICD-10-CM | POA: Diagnosis not present

## 2020-09-26 NOTE — Progress Notes (Signed)
Numbness/ tingling medial hands. Sometimes affects other fingers. Drops small things. Right hand dominant No lotion per patient Numeric Pain Rating Scale and Functional Assessment Average Pain 4   In the last MONTH (on 0-10 scale) has pain interfered with the following?  1. General activity like being  able to carry out your everyday physical activities such as walking, climbing stairs, carrying groceries, or moving a chair?  Rating(5)

## 2020-10-03 ENCOUNTER — Ambulatory Visit: Payer: BC Managed Care – PPO | Admitting: Family Medicine

## 2020-10-03 ENCOUNTER — Encounter: Payer: Self-pay | Admitting: Family Medicine

## 2020-10-03 ENCOUNTER — Other Ambulatory Visit: Payer: Self-pay

## 2020-10-03 DIAGNOSIS — R202 Paresthesia of skin: Secondary | ICD-10-CM | POA: Diagnosis not present

## 2020-10-03 DIAGNOSIS — R200 Anesthesia of skin: Secondary | ICD-10-CM | POA: Diagnosis not present

## 2020-10-03 NOTE — Progress Notes (Signed)
Office Visit Note   Patient: [name]           Date of Birth: Dec 22, 1960           MRN: [id number] Visit Date: 10/03/2020 Requested by: Pearline Cables, MD [address] [place],  [place] [address] PCP: Pearline Cables, MD  Subjective: Chief Complaint  Patient presents with  . Other    Follow up post NCV bilateral forearms.    HPI: He is here for follow-up chronic bilateral upper extremity numbness and tingling in the ulnar nerve distribution.  He had nerve studies done recently which showed severe bilateral cubital tunnel syndrome with sensory and motor involvement, demyelination and axonal injury.               ROS:   All other systems were reviewed and are negative.  Objective: Vital Signs: There were no vitals taken for this visit.  Physical Exam:  General:  Alert and oriented, in no acute distress. Pulm:  Breathing unlabored. Psy:  Normal mood, congruent affect.  Upper extremities: He still has 5/5 strength in both upper extremities including intrinsic hand function.    Imaging: No results found.  Assessment & Plan: 1.  Severe bilateral cubital tunnel syndrome -Discussed with patient and elected to refer him to Dr. Amanda Pea for surgical consultation.     Procedures: No procedures performed        PMFS History: Patient Active Problem List   Diagnosis Date Noted  . Prediabetes 02/14/2020  . Prostate cancer screening 04/29/2016  . Visit for preventive health examination 04/29/2016  . Colon cancer screening 04/29/2016  . Acute deep vein thrombosis (DVT) of left lower extremity (HCC) 02/05/2016  . Acute pulmonary embolism (HCC) 02/04/2016  . Lung nodule 02/04/2016  . Obesity 02/04/2016  . Idiopathic hypothyroidism 02/09/2015  . Edema of both legs 02/09/2015  . Sleep apnea 03/21/2013  . Hypertension, benign 03/21/2013   Past Medical History:  Diagnosis Date  . Deep vein thrombosis (DVT) of left lower extremity (HCC)   .  GERD (gastroesophageal reflux disease)   . History of chicken pox   . Hypertension   . Measles   . Pulmonary embolism (HCC)   . Sleep apnea   . Thyroid disease    Unfounded    Family History  Problem Relation Age of Onset  . Hypertension Father        Living  . Heart disease Mother        Living  . Pulmonary embolism Mother   . Anemia Mother   . Hypertension Other        Paternal Side  . Hypertension Sister        x2  . Migraines Sister   . Allergies Son        x1    Past Surgical History:  Procedure Laterality Date  . COLONOSCOPY WITH PROPOFOL N/A 11/29/2016   Procedure: COLONOSCOPY WITH PROPOFOL;  Surgeon: Bernette Redbird, MD;  Location: [facility];  Service: Endoscopy;  Laterality: N/A;  . FOOT SURGERY     Chain saw accident   Social History   Occupational History  . Not on file  Tobacco Use  . Smoking status: Never Smoker  . Smokeless tobacco: Never Used  Vaping Use  . Vaping Use: Never used  Substance and Sexual Activity  . Alcohol use: No    Alcohol/week: 0.0 standard drinks  . Drug use: No  . Sexual activity: Never

## 2020-10-03 NOTE — Procedures (Signed)
EMG & NCV Findings: Evaluation of the left median motor and the right median motor nerves showed decreased conduction velocity (Elbow-Wrist, L48, R46 m/s).  The left ulnar motor nerve showed reduced amplitude (2.7 mV), decreased conduction velocity (B Elbow-Wrist, 51 m/s), and decreased conduction velocity (A Elbow-B Elbow, 42 m/s).  The right ulnar motor nerve showed decreased conduction velocity (B Elbow-Wrist, 51 m/s) and decreased conduction velocity (A Elbow-B Elbow, 52 m/s).  The left median (across palm) sensory nerve showed prolonged distal peak latency (Wrist, 3.8 ms).  The right median (across palm) sensory nerve showed prolonged distal peak latency (Wrist, 4.2 ms) and prolonged distal peak latency (Palm, 3.7 ms).  The left ulnar sensory nerve showed prolonged distal peak latency (3.9 ms), reduced amplitude (10.4 V), and decreased conduction velocity (Wrist-5th Digit, 36 m/s).  The right ulnar sensory nerve showed no response (Wrist).  All remaining nerves (as indicated in the following tables) were within normal limits.  Left vs. Right side comparison data for the ulnar motor nerve indicates abnormal L-R amplitude difference (32.5 %).  All remaining left vs. right side differences were within normal limits.    Needle evaluation of the right first dorsal interosseous and the left first dorsal interosseous muscles showed increased insertional activity, increased spontaneous activity, increased motor unit amplitude, and diminished recruitment.  All remaining muscles (as indicated in the following table) showed no evidence of electrical instability.    Impression: The above electrodiagnostic study is ABNORMAL and reveals evidence of a severe bilateral ulnar nerve neuropathy at the elbow (cubital tunnel syndrome) affecting sensory and motor components.  Interestingly, we typically see a conduction velocity drop across the elbows and this may be technical artifact to the patient's size and also the  waveform obtained but there seems to be generalized slowing of the ulnar nerve not necessarily a drop across the elbow.  Care should be taken that the lesion could be distal to the elbow.  The above electrodiagnostic study is ALSO  reveals evidence of a mild bilateral median nerve entrapment at the wrist affecting sensory components.   There is no significant electrodiagnostic evidence of any other focal nerve entrapment, brachial plexopathy or cervical radiculopathy.   Recommendations: 1.  Follow-up with referring physician. 2.  Continue current management of symptoms. 3.  Suggest surgical evaluation.  If symptoms improving consider repeat test in 3 months.  ___________________________ Naaman Plummer [facility] Board Certified, American Board of Physical Medicine and Rehabilitation    Nerve Conduction Studies Anti Sensory Summary Table   Stim Site NR Peak (ms) Norm Peak (ms) P-T Amp (V) Norm P-T Amp Site1 Site2 Delta-P (ms) Dist (cm) Vel (m/s) Norm Vel (m/s)  Left Median Acr Palm Anti Sensory (2nd Digit)  33.1C  Wrist    *3.8 <3.6 13.6 >10 Wrist Palm 1.9 0.0    Palm    1.9 <2.0 4.0         Right Median Acr Palm Anti Sensory (2nd Digit)  30.5C  Wrist    *4.2 <3.6 14.9 >10 Wrist Palm 0.5 0.0    Palm    *3.7 <2.0 5.8         Left Radial Anti Sensory (Base 1st Digit)  32.8C  Wrist    2.2 <3.1 17.2  Wrist Base 1st Digit 2.2 0.0    Right Radial Anti Sensory (Base 1st Digit)  31.2C  Wrist    2.3 <3.1 12.3  Wrist Base 1st Digit 2.3 0.0    Left Ulnar Anti Sensory (5th Digit)  33.2C  Wrist    *3.9 <3.7 *10.4 >15.0 Wrist 5th Digit 3.9 14.0 *36 >38  Right Ulnar Anti Sensory (5th Digit)  31.2C  Wrist *NR  <3.7  >15.0 Wrist 5th Digit  14.0  >38   Motor Summary Table   Stim Site NR Onset (ms) Norm Onset (ms) O-P Amp (mV) Norm O-P Amp Site1 Site2 Delta-0 (ms) Dist (cm) Vel (m/s) Norm Vel (m/s)  Left Median Motor (Abd Poll Brev)  33C  Wrist    4.0 <4.2 5.7 >5 Elbow Wrist 5.3 25.5 *48 >50   Elbow    9.3  5.7         Right Median Motor (Abd Poll Brev)  31.5C  Wrist    3.4 <4.2 7.7 >5 Elbow Wrist 5.5 25.5 *46 >50  Elbow    8.9  3.8         Left Ulnar Motor (Abd Dig Min)  32.7C  Wrist    3.8 <4.2 *2.7 >3 B Elbow Wrist 4.5 23.0 *51 >53  B Elbow    8.3  4.1  A Elbow B Elbow 2.6 11.0 *42 >53  A Elbow    10.9  3.3         Right Ulnar Motor (Abd Dig Min)  32C  Wrist    3.4 <4.2 4.0 >3 B Elbow Wrist 4.5 23.0 *51 >53  B Elbow    7.9  3.8  A Elbow B Elbow 2.1 11.0 *52 >53  A Elbow    10.0  3.4          EMG   Side Muscle Nerve Root Ins Act Fibs Psw Amp Dur Poly Recrt Int Dennie Bible Comment  Right Abd Poll Brev Median C8-T1 Nml Nml Nml Nml Nml 0 Nml Nml   Right 1stDorInt Ulnar C8-T1 *Incr *3+ *3+ *Incr Nml 0 *Reduced Nml   Right PronatorTeres Median C6-7 Nml Nml Nml Nml Nml 0 Nml Nml   Right Biceps Musculocut C5-6 Nml Nml Nml Nml Nml 0 Nml Nml   Right Deltoid Axillary C5-6 Nml Nml Nml Nml Nml 0 Nml Nml   Left Abd Poll Brev Median C8-T1 Nml Nml Nml Nml Nml 0 Nml Nml   Left 1stDorInt Ulnar C8-T1 *Incr *3+ *3+ *Incr Nml 0 *Reduced Nml     Nerve Conduction Studies Anti Sensory Left/Right Comparison   Stim Site L Lat (ms) R Lat (ms) L-R Lat (ms) L Amp (V) R Amp (V) L-R Amp (%) Site1 Site2 L Vel (m/s) R Vel (m/s) L-R Vel (m/s)  Median Acr Palm Anti Sensory (2nd Digit)  33.1C  Wrist *3.8 *4.2 0.4 13.6 14.9 8.7 Wrist Palm     Palm 1.9 *3.7 1.8 4.0 5.8 31.0       Radial Anti Sensory (Base 1st Digit)  32.8C  Wrist 2.2 2.3 0.1 17.2 12.3 28.5 Wrist Base 1st Digit     Ulnar Anti Sensory (5th Digit)  33.2C  Wrist *3.9   *10.4   Wrist 5th Digit *36     Motor Left/Right Comparison   Stim Site L Lat (ms) R Lat (ms) L-R Lat (ms) L Amp (mV) R Amp (mV) L-R Amp (%) Site1 Site2 L Vel (m/s) R Vel (m/s) L-R Vel (m/s)  Median Motor (Abd Poll Brev)  33C  Wrist 4.0 3.4 0.6 5.7 7.7 26.0 Elbow Wrist *48 *46 2  Elbow 9.3 8.9 0.4 5.7 3.8 33.3       Ulnar Motor (Abd Dig Min)  32.7C  Wrist 3.8 3.4  0.4 *2.7 4.0 *32.5 B Elbow Wrist *51 *51 0  B Elbow 8.3 7.9 0.4 4.1 3.8 7.3 A Elbow B Elbow *42 *52 10  A Elbow 10.9 10.0 0.9 3.3 3.4 2.9          Waveforms:

## 2020-10-04 NOTE — Progress Notes (Signed)
[name] Ramerry R Zilka - 59 y.o. male MRN [id number]  Date of birth: 02-19-1961  Office Visit Note: Visit Date: 09/26/2020 PCP: Pearline Cablesopland, Jessica C, MD Referred by: Pearline Cablesopland, Jessica C, MD  Subjective: Chief Complaint  Patient presents with  . Right Hand - Numbness  . Left Hand - Numbness   HPI:  [name] is a 59 y.o. male who comes in today at the request of Dr. Lavada MesiMichael Hilts for electrodiagnostic study of the Bilateral upper extremities.  Patient is Right hand dominant.  He reports almost 6 months of pain numbness and tingling particular in the fourth and fifth digits in both hands.  He reports some times he wants to drop objects and has difficulty with his hands.  He rates his pain as a 4 out of 10 but is mostly worried about the numbness and tingling.  He denies any neck pain or frank radicular symptoms.  His history is such that in June of this year he went on vacation and did not bring his CPAP machine and so he was sleeping in a recliner.  After sleeping in the recliner he did notice the initial tingling in the fourth and fifth digits bilaterally.  He has not had much in the way of worsening since that time but not much improvement.  He is not diabetic.   ROS Otherwise per HPI.  Assessment & Plan: Visit Diagnoses:    ICD-10-CM   1. Paresthesia of skin  R20.2 NCV with EMG (electromyography)    Plan: Impression: The above electrodiagnostic study is ABNORMAL and reveals evidence of a severe bilateral ulnar nerve neuropathy at the elbow (cubital tunnel syndrome) affecting sensory and motor components.  Interestingly, we typically see a conduction velocity drop across the elbows and this may be technical artifact to the patient's size and also the waveform obtained but there seems to be generalized slowing of the ulnar nerve not necessarily a drop across the elbow.  Care should be taken that the lesion could be distal to the elbow.  The above electrodiagnostic study is ALSO  reveals  evidence of a mild bilateral median nerve entrapment at the wrist affecting sensory components.   There is no significant electrodiagnostic evidence of any other focal nerve entrapment, brachial plexopathy or cervical radiculopathy.   Recommendations: 1.  Follow-up with referring physician. 2.  Continue current management of symptoms. 3.  Suggest surgical evaluation.  If symptoms improving consider repeat test in 3 months.  Meds & Orders: No orders of the defined types were placed in this encounter.   Orders Placed This Encounter  Procedures  . NCV with EMG (electromyography)    Follow-up: Return for Lavada MesiMichael Hilts, MD.   Procedures: No procedures performed  EMG & NCV Findings: Evaluation of the left median motor and the right median motor nerves showed decreased conduction velocity (Elbow-Wrist, L48, R46 m/s).  The left ulnar motor nerve showed reduced amplitude (2.7 mV), decreased conduction velocity (B Elbow-Wrist, 51 m/s), and decreased conduction velocity (A Elbow-B Elbow, 42 m/s).  The right ulnar motor nerve showed decreased conduction velocity (B Elbow-Wrist, 51 m/s) and decreased conduction velocity (A Elbow-B Elbow, 52 m/s).  The left median (across palm) sensory nerve showed prolonged distal peak latency (Wrist, 3.8 ms).  The right median (across palm) sensory nerve showed prolonged distal peak latency (Wrist, 4.2 ms) and prolonged distal peak latency (Palm, 3.7 ms).  The left ulnar sensory nerve showed prolonged distal peak latency (3.9 ms), reduced amplitude (10.4 V), and decreased  conduction velocity [facility] Digit, 36 m/s).  The right ulnar sensory nerve showed no response (Wrist).  All remaining nerves (as indicated in the following tables) were within normal limits.  Left vs. Right side comparison data for the ulnar motor nerve indicates abnormal L-R amplitude difference (32.5 %).  All remaining left vs. right side differences were within normal limits.    Needle evaluation  of the right first dorsal interosseous and the left first dorsal interosseous muscles showed increased insertional activity, increased spontaneous activity, increased motor unit amplitude, and diminished recruitment.  All remaining muscles (as indicated in the following table) showed no evidence of electrical instability.    Impression: The above electrodiagnostic study is ABNORMAL and reveals evidence of a severe bilateral ulnar nerve neuropathy at the elbow (cubital tunnel syndrome) affecting sensory and motor components.  Interestingly, we typically see a conduction velocity drop across the elbows and this may be technical artifact to the patient's size and also the waveform obtained but there seems to be generalized slowing of the ulnar nerve not necessarily a drop across the elbow.  Care should be taken that the lesion could be distal to the elbow.  The above electrodiagnostic study is ALSO  reveals evidence of a mild bilateral median nerve entrapment at the wrist affecting sensory components.   There is no significant electrodiagnostic evidence of any other focal nerve entrapment, brachial plexopathy or cervical radiculopathy.   Recommendations: 1.  Follow-up with referring physician. 2.  Continue current management of symptoms. 3.  Suggest surgical evaluation.  If symptoms improving consider repeat test in 3 months.  ___________________________ Naaman Plummer [facility] Board Certified, American Board of Physical Medicine and Rehabilitation    Nerve Conduction Studies Anti Sensory Summary Table   Stim Site NR Peak (ms) Norm Peak (ms) P-T Amp (V) Norm P-T Amp Site1 Site2 Delta-P (ms) Dist (cm) Vel (m/s) Norm Vel (m/s)  Left Median Acr Palm Anti Sensory (2nd Digit)  33.1C  Wrist    *3.8 <3.6 13.6 >10 Wrist Palm 1.9 0.0    Palm    1.9 <2.0 4.0         Right Median Acr Palm Anti Sensory (2nd Digit)  30.5C  Wrist    *4.2 <3.6 14.9 >10 Wrist Palm 0.5 0.0    Palm    *3.7 <2.0 5.8          Left Radial Anti Sensory (Base 1st Digit)  32.8C  Wrist    2.2 <3.1 17.2  Wrist Base 1st Digit 2.2 0.0    Right Radial Anti Sensory (Base 1st Digit)  31.2C  Wrist    2.3 <3.1 12.3  Wrist Base 1st Digit 2.3 0.0    Left Ulnar Anti Sensory (5th Digit)  33.2C  Wrist    *3.9 <3.7 *10.4 >15.0 Wrist 5th Digit 3.9 14.0 *36 >38  Right Ulnar Anti Sensory (5th Digit)  31.2C  Wrist *NR  <3.7  >15.0 Wrist 5th Digit  14.0  >38   Motor Summary Table   Stim Site NR Onset (ms) Norm Onset (ms) O-P Amp (mV) Norm O-P Amp Site1 Site2 Delta-0 (ms) Dist (cm) Vel (m/s) Norm Vel (m/s)  Left Median Motor (Abd Poll Brev)  33C  Wrist    4.0 <4.2 5.7 >5 Elbow Wrist 5.3 25.5 *48 >50  Elbow    9.3  5.7         Right Median Motor (Abd Poll Brev)  31.5C  Wrist    3.4 <4.2 7.7 >5 Elbow Wrist  5.5 25.5 *46 >50  Elbow    8.9  3.8         Left Ulnar Motor (Abd Dig Min)  32.7C  Wrist    3.8 <4.2 *2.7 >3 B Elbow Wrist 4.5 23.0 *51 >53  B Elbow    8.3  4.1  A Elbow B Elbow 2.6 11.0 *42 >53  A Elbow    10.9  3.3         Right Ulnar Motor (Abd Dig Min)  32C  Wrist    3.4 <4.2 4.0 >3 B Elbow Wrist 4.5 23.0 *51 >53  B Elbow    7.9  3.8  A Elbow B Elbow 2.1 11.0 *52 >53  A Elbow    10.0  3.4          EMG   Side Muscle Nerve Root Ins Act Fibs Psw Amp Dur Poly Recrt Int Dennie Bible Comment  Right Abd Poll Brev Median C8-T1 Nml Nml Nml Nml Nml 0 Nml Nml   Right 1stDorInt Ulnar C8-T1 *Incr *3+ *3+ *Incr Nml 0 *Reduced Nml   Right PronatorTeres Median C6-7 Nml Nml Nml Nml Nml 0 Nml Nml   Right Biceps Musculocut C5-6 Nml Nml Nml Nml Nml 0 Nml Nml   Right Deltoid Axillary C5-6 Nml Nml Nml Nml Nml 0 Nml Nml   Left Abd Poll Brev Median C8-T1 Nml Nml Nml Nml Nml 0 Nml Nml   Left 1stDorInt Ulnar C8-T1 *Incr *3+ *3+ *Incr Nml 0 *Reduced Nml     Nerve Conduction Studies Anti Sensory Left/Right Comparison   Stim Site L Lat (ms) R Lat (ms) L-R Lat (ms) L Amp (V) R Amp (V) L-R Amp (%) Site1 Site2 L Vel (m/s) R Vel (m/s) L-R Vel  (m/s)  Median Acr Palm Anti Sensory (2nd Digit)  33.1C  Wrist *3.8 *4.2 0.4 13.6 14.9 8.7 Wrist Palm     Palm 1.9 *3.7 1.8 4.0 5.8 31.0       Radial Anti Sensory (Base 1st Digit)  32.8C  Wrist 2.2 2.3 0.1 17.2 12.3 28.5 Wrist Base 1st Digit     Ulnar Anti Sensory (5th Digit)  33.2C  Wrist *3.9   *10.4   Wrist 5th Digit *36     Motor Left/Right Comparison   Stim Site L Lat (ms) R Lat (ms) L-R Lat (ms) L Amp (mV) R Amp (mV) L-R Amp (%) Site1 Site2 L Vel (m/s) R Vel (m/s) L-R Vel (m/s)  Median Motor (Abd Poll Brev)  33C  Wrist 4.0 3.4 0.6 5.7 7.7 26.0 Elbow Wrist *48 *46 2  Elbow 9.3 8.9 0.4 5.7 3.8 33.3       Ulnar Motor (Abd Dig Min)  32.7C  Wrist 3.8 3.4 0.4 *2.7 4.0 *32.5 B Elbow Wrist *51 *51 0  B Elbow 8.3 7.9 0.4 4.1 3.8 7.3 A Elbow B Elbow *42 *52 10  A Elbow 10.9 10.0 0.9 3.3 3.4 2.9          Waveforms:                      Clinical History: No specialty comments available.     Objective:  VS:  HT:    WT:   BMI:     BP:   HR: bpm  TEMP: ( )  RESP:  Physical Exam Musculoskeletal:        General: No tenderness.     Comments: Inspection reveals mild atrophy and fasciculation in the right  abductor digiti minimi and some in the FDI bilaterally but no atrophy in the hand intrinsics and no atrophy of the APB.  There is no swelling, color changes, allodynia or dystrophic changes. There is 5 out of 5 strength in the bilateral wrist extension and  long finger flexion but 4 out of 5 finger abduction.  There is decreased sensation to light touch in ulnar nerve distribution bilaterally.. There is a positive Froment's test bilaterally. There is a negative Hoffmann's test bilaterally.  Skin:    General: Skin is warm and dry.     Findings: No erythema or rash.  Neurological:     General: No focal deficit present.     Mental Status: He is alert and oriented to person, place, and time.     Sensory: No sensory deficit.     Motor: No weakness or abnormal muscle tone.      Coordination: Coordination normal.     Gait: Gait normal.  Psychiatric:        Mood and Affect: Mood normal.        Behavior: Behavior normal.        Thought Content: Thought content normal.      Imaging: No results found.

## 2020-10-24 DIAGNOSIS — G5623 Lesion of ulnar nerve, bilateral upper limbs: Secondary | ICD-10-CM | POA: Diagnosis not present

## 2020-10-24 DIAGNOSIS — G5603 Carpal tunnel syndrome, bilateral upper limbs: Secondary | ICD-10-CM | POA: Diagnosis not present

## 2020-11-25 DIAGNOSIS — G5601 Carpal tunnel syndrome, right upper limb: Secondary | ICD-10-CM | POA: Diagnosis not present

## 2020-11-25 DIAGNOSIS — G8918 Other acute postprocedural pain: Secondary | ICD-10-CM | POA: Diagnosis not present

## 2020-11-25 DIAGNOSIS — G5621 Lesion of ulnar nerve, right upper limb: Secondary | ICD-10-CM | POA: Diagnosis not present

## 2020-12-08 DIAGNOSIS — M25521 Pain in right elbow: Secondary | ICD-10-CM | POA: Diagnosis not present

## 2020-12-14 DIAGNOSIS — M25641 Stiffness of right hand, not elsewhere classified: Secondary | ICD-10-CM | POA: Diagnosis not present

## 2020-12-21 DIAGNOSIS — M25641 Stiffness of right hand, not elsewhere classified: Secondary | ICD-10-CM | POA: Diagnosis not present

## 2020-12-28 DIAGNOSIS — M25641 Stiffness of right hand, not elsewhere classified: Secondary | ICD-10-CM | POA: Diagnosis not present

## 2021-01-04 DIAGNOSIS — M25641 Stiffness of right hand, not elsewhere classified: Secondary | ICD-10-CM | POA: Diagnosis not present

## 2021-02-25 NOTE — Progress Notes (Addendum)
[facility] [address] [place], [place] [address] [phone]  Date:  03/01/2021   Name:  [name]   DOB:  09-01-1961   MRN:  [id number]  PCP:  Pearline Cables, MD    Chief Complaint: Medical Management of Chronic Issues (6 m f/u )   History of Present Illness:  [name] is a 60 y.o. very pleasant male patient who presents with the following:  Here today for periodic follow-up visit Last visit with myself in November- history of hypertension, DVT, hypothyroidism, sleep apnea, obesity, prediabetes  COVID-19 series-not done, patient states he does not intend to have this vaccine series.  I did encourage him to be vaccinated, advised that he is at quite high risk for complications from COVID including death especially given his history of DVT and BMI approaching 50 Shingles vaccine-not done Colonoscopy up-to-date  Since our last visit he had surgery for an ulnar nerve entrapment - right only-and also a carpal tunnel release on the right side in February He has tingling however in both hands.  We have not done an MRI of his cervical spine as of yet  I discussed getting an cervical spine films and perhaps a cervical MRI, for the time being he declines.  He has been advised that it may take a few more months to see full benefit from his recent surgical procedures and he wants to give it more time  Wt Readings from Last 3 Encounters:  03/01/21 (!) 363 lb (164.7 kg)  09/01/20 (!) 361 lb (163.7 kg)  05/26/20 (!) 363 lb (164.7 kg)   He does not check BP at home  BP Readings from Last 3 Encounters:  03/01/21 (!) 150/84  09/01/20 140/90  05/26/20 (!) 148/82   Current blood pressure medications are losartan 50/HCTZ 12.5,Carvedilol 12.5, and also Lasix 40 twice daily.  He is taking potassium  He notes that his current CPAP machine is about 60 years old, he needs a new mask but does not currently have a  pulmonologist.  He wonders if I could help him with this, I advised that I am certainly glad to reorder supplies for him-I may need guidance about exactly what to order from home health company  Lab Results  Component Value Date   HGBA1C 6.2 09/01/2020    Patient Active Problem List   Diagnosis Date Noted  . Prediabetes 02/14/2020  . Prostate cancer screening 04/29/2016  . Visit for preventive health examination 04/29/2016  . Colon cancer screening 04/29/2016  . Acute deep vein thrombosis (DVT) of left lower extremity (HCC) 02/05/2016  . Acute pulmonary embolism (HCC) 02/04/2016  . Lung nodule 02/04/2016  . Obesity 02/04/2016  . Idiopathic hypothyroidism 02/09/2015  . Edema of both legs 02/09/2015  . Sleep apnea 03/21/2013  . Hypertension, benign 03/21/2013    Past Medical History:  Diagnosis Date  . Deep vein thrombosis (DVT) of left lower extremity (HCC)   . GERD (gastroesophageal reflux disease)   . History of chicken pox   . Hypertension   . Measles   . Pulmonary embolism (HCC)   . Sleep apnea   . Thyroid disease    Unfounded    Past Surgical History:  Procedure Laterality Date  . COLONOSCOPY WITH PROPOFOL N/A 11/29/2016   Procedure: COLONOSCOPY WITH PROPOFOL;  Surgeon: Bernette Redbird, MD;  Location: [facility];  Service: Endoscopy;  Laterality: N/A;  . FOOT SURGERY  Chain saw accident    Social History   Tobacco Use  . Smoking status: Never Smoker  . Smokeless tobacco: Never Used  Vaping Use  . Vaping Use: Never used  Substance Use Topics  . Alcohol use: No    Alcohol/week: 0.0 standard drinks  . Drug use: No    Family History  Problem Relation Age of Onset  . Hypertension Father        Living  . Heart disease Mother        Living  . Pulmonary embolism Mother   . Anemia Mother   . Hypertension Other        Paternal Side  . Hypertension Sister        x2  . Migraines Sister   . Allergies Son        x1    No Known Allergies  Medication  list has been reviewed and updated.  Current Outpatient Medications on File Prior to Visit  Medication Sig Dispense Refill  . aspirin EC 81 MG tablet Take 81 mg by mouth daily. Takes two 81 mg tablets in morning.    . carvedilol (COREG) 12.5 MG tablet Take 1 tablet (12.5 mg total) by mouth at bedtime. 30 tablet 0  . furosemide (LASIX) 40 MG tablet Take 1 tablet (40 mg total) by mouth 2 (two) times daily. 60 tablet 5  . levothyroxine (SYNTHROID) 25 MCG tablet TAKE 1 TABLET BY MOUTH DAILY BEFORE BREAKFAST 90 tablet 1  . losartan-hydrochlorothiazide (HYZAAR) 50-12.5 MG tablet Take 1 tablet by mouth daily. 30 tablet 0  . potassium chloride SA (KLOR-CON) 20 MEQ tablet Take 1 tablet (20 mEq total) by mouth daily. 30 tablet 5  . hydrochlorothiazide (MICROZIDE) 12.5 MG capsule Take 1 capsule (12.5 mg total) by mouth daily. (Patient not taking: Reported on 03/01/2021) 90 capsule 3   No current facility-administered medications on file prior to visit.    Review of Systems:  As per HPI- otherwise negative.   Physical Examination: Vitals:   03/01/21 1554  BP: (!) 150/84  Pulse: 60  Temp: 97.9 F (36.6 C)  SpO2: 99%   Vitals:   03/01/21 1554  Weight: (!) 363 lb (164.7 kg)  Height: 6\' 1"  (1.854 m)   Body mass index is 47.89 kg/m. Ideal Body Weight: Weight in (lb) to have BMI = 25: 189.1  GEN: no acute distress.  Morbidly obese, looks well  HEENT: Atraumatic, Normocephalic.  Ears and Nose: No external deformity. CV: RRR, No M/G/R. No JVD. No thrill. No extra heart sounds. PULM: CTA B, no wheezes, crackles, rhonchi. No retractions. No resp. distress. No accessory muscle use. ABD: S, NT, ND, +BS. No rebound. No HSM. EXTR: No c/c/e PSYCH: Normally interactive. Conversant.   Wt Readings from Last 3 Encounters:  03/01/21 (!) 363 lb (164.7 kg)  09/01/20 (!) 361 lb (163.7 kg)  05/26/20 (!) 363 lb (164.7 kg)     Assessment and Plan: Essential hypertension - Plan: Basic metabolic panel,  losartan-hydrochlorothiazide (HYZAAR) 100-12.5 MG tablet  Abnormal TSH - Plan: TSH  Morbid obesity (HCC)  Prediabetes - Plan: Hemoglobin A1c  Ulnar neuropathy of both upper extremities  Sleep apnea, unspecified type  Patient in today for follow-up visit.  His blood pressure is high on current regimen, will increase losartan to 100 mg. He is also on a significant amount of diuretic, check potassium today Follow-up on diabetes with A1c Follow-up hypothyroidism with TSH Discussed apparent ulnar neuropathy, unfortunately he has not had much benefit  from his recent surgical procedures as of yet.  If he likes, we can obtain cervical spine films and also a cervical MRI at some point in the future  He will contact [facility] and have them send me an order form with any CPAP supplies he may need  Encouraged COVID-19 vaccination   This visit occurred during the SARS-CoV-2 public health emergency.  Safety protocols were in place, including screening questions prior to the visit, additional usage of staff PPE, and extensive cleaning of exam room while observing appropriate contact time as indicated for disinfecting solutions.    Signed Abbe Amsterdam, MD  Received results as below, 5/20 Patient does not have my chart Call patient to go over results.  Labs look okay except TSH is elevated.  He is only taking 25 mcg of levothyroxine.  I will increase his dose to 50 mcg.  Explained that typically I would recheck a TSH in 6 to 8 weeks.  However, since he is on such a small amount of medication I do not think he is at high risk for toxicity, and also could plan to recheck in 6 months.  He prefers this, I scheduled him for November  Results for orders placed or performed in visit on 03/01/21  Hemoglobin A1c  Result Value Ref Range   Hgb A1c MFr Bld 6.3 4.6 - 6.5 %  TSH  Result Value Ref Range   TSH 4.81 (H) 0.35 - 4.50 uIU/mL  Basic metabolic panel  Result Value Ref Range   Sodium  138 135 - 145 mEq/L   Potassium 4.2 3.5 - 5.1 mEq/L   Chloride 101 96 - 112 mEq/L   CO2 29 19 - 32 mEq/L   Glucose, Bld 100 (H) 70 - 99 mg/dL   BUN 18 6 - 23 mg/dL   Creatinine, Ser 5.80 0.40 - 1.50 mg/dL   GFR 99.83 >38.25 mL/min   Calcium 9.4 8.4 - 10.5 mg/dL   Current dose of levothyroxine 25-would like to increase to 50

## 2021-02-27 ENCOUNTER — Other Ambulatory Visit: Payer: Self-pay | Admitting: Family Medicine

## 2021-02-27 DIAGNOSIS — I10 Essential (primary) hypertension: Secondary | ICD-10-CM

## 2021-03-01 ENCOUNTER — Ambulatory Visit: Payer: BC Managed Care – PPO | Admitting: Family Medicine

## 2021-03-01 ENCOUNTER — Encounter: Payer: Self-pay | Admitting: Family Medicine

## 2021-03-01 ENCOUNTER — Other Ambulatory Visit: Payer: Self-pay

## 2021-03-01 VITALS — BP 150/84 | HR 60 | Temp 97.9°F | Ht 73.0 in | Wt 363.0 lb

## 2021-03-01 DIAGNOSIS — G5623 Lesion of ulnar nerve, bilateral upper limbs: Secondary | ICD-10-CM

## 2021-03-01 DIAGNOSIS — R7303 Prediabetes: Secondary | ICD-10-CM

## 2021-03-01 DIAGNOSIS — G4730 Sleep apnea, unspecified: Secondary | ICD-10-CM

## 2021-03-01 DIAGNOSIS — R7989 Other specified abnormal findings of blood chemistry: Secondary | ICD-10-CM | POA: Diagnosis not present

## 2021-03-01 DIAGNOSIS — I10 Essential (primary) hypertension: Secondary | ICD-10-CM

## 2021-03-01 DIAGNOSIS — E039 Hypothyroidism, unspecified: Secondary | ICD-10-CM

## 2021-03-01 MED ORDER — LOSARTAN POTASSIUM-HCTZ 100-12.5 MG PO TABS: 1.0000 | ORAL_TABLET | Freq: Every day | ORAL | 3 refills | Status: DC

## 2021-03-01 NOTE — Patient Instructions (Addendum)
We will increase your losartan to 100 mg- please change over at your convenience I will be in touch with your labs asap  Please work on weight loss  I can order a CPAP mask for you if you need- please called [facility] and ask them if they can fax me an order form  Fax [phone]

## 2021-03-02 LAB — BASIC METABOLIC PANEL
BUN: 18 mg/dL (ref 6–23)
CO2: 29 mEq/L (ref 19–32)
Calcium: 9.4 mg/dL (ref 8.4–10.5)
Chloride: 101 mEq/L (ref 96–112)
Creatinine, Ser: 1.16 mg/dL (ref 0.40–1.50)
GFR: 68.96 mL/min (ref >60.00)
Glucose, Bld: 100 mg/dL — ABNORMAL HIGH (ref 70–99)
Potassium: 4.2 mEq/L (ref 3.5–5.1)
Sodium: 138 mEq/L (ref 135–145)

## 2021-03-02 LAB — TSH: TSH: 4.81 u[IU]/mL — ABNORMAL HIGH (ref 0.35–4.50)

## 2021-03-02 LAB — HEMOGLOBIN A1C: Hgb A1c MFr Bld: 6.3 % (ref 4.6–6.5)

## 2021-03-03 MED ORDER — LEVOTHYROXINE SODIUM 50 MCG PO TABS: 50.0000 ug | ORAL_TABLET | Freq: Every day | ORAL | 3 refills | Status: DC

## 2021-03-03 NOTE — Addendum Note (Signed)
Addended by: Abbe Amsterdam C on: 03/03/2021 01:07 PM   Modules accepted: Orders

## 2021-03-23 DIAGNOSIS — G5603 Carpal tunnel syndrome, bilateral upper limbs: Secondary | ICD-10-CM | POA: Diagnosis not present

## 2021-03-23 DIAGNOSIS — Z4789 Encounter for other orthopedic aftercare: Secondary | ICD-10-CM | POA: Diagnosis not present

## 2021-03-23 DIAGNOSIS — M79642 Pain in left hand: Secondary | ICD-10-CM | POA: Diagnosis not present

## 2021-03-23 DIAGNOSIS — M79641 Pain in right hand: Secondary | ICD-10-CM | POA: Diagnosis not present

## 2021-03-31 ENCOUNTER — Other Ambulatory Visit: Payer: Self-pay | Admitting: Family Medicine

## 2021-03-31 DIAGNOSIS — R600 Localized edema: Secondary | ICD-10-CM

## 2021-08-24 NOTE — Patient Instructions (Addendum)
Good to see you today- assuming all is well please see me in about 6 months  I do continue to recommend seasonal flu shot, covid vaccine and shingles vaccine

## 2021-08-24 NOTE — Progress Notes (Addendum)
[facility] at [facility] [address], [address] [place], [place] [address] [phone]  Date:  08/30/2021   Name:  [name]   DOB:  12/03/1960   MRN:  [id number]  PCP:  Pearline Cables, MD    Chief Complaint: No chief complaint on file.   History of Present Illness:  [name] is a 60 y.o. very pleasant male patient who presents with the following:  Pt seen today for follow-up Last visit with myself in May- history of hypertension, DVT, hypothyroidism, sleep apnea, obesity, prediabetes  He has refused covid vaccination  Shingrix- declines  Flu vaccine- declines  Needs labs today   Asa Coreg Lasix 40 daily Synthroid Hyzaar Kdur 20   He has been doing well No concerns  His hand surgery unfortunately did not help that much  His family is well    Patient Active Problem List   Diagnosis Date Noted   Prediabetes 02/14/2020   Prostate cancer screening 04/29/2016   Visit for preventive health examination 04/29/2016   Colon cancer screening 04/29/2016   Acute deep vein thrombosis (DVT) of left lower extremity (HCC) 02/05/2016   Acute pulmonary embolism (HCC) 02/04/2016   Lung nodule 02/04/2016   Obesity 02/04/2016   Idiopathic hypothyroidism 02/09/2015   Edema of both legs 02/09/2015   Sleep apnea 03/21/2013   Hypertension, benign 03/21/2013    Past Medical History:  Diagnosis Date   Deep vein thrombosis (DVT) of left lower extremity (HCC)    GERD (gastroesophageal reflux disease)    History of chicken pox    Hypertension    Measles    Pulmonary embolism (HCC)    Sleep apnea    Thyroid disease    Unfounded    Past Surgical History:  Procedure Laterality Date   COLONOSCOPY WITH PROPOFOL N/A 11/29/2016   Procedure: COLONOSCOPY WITH PROPOFOL;  Surgeon: Bernette Redbird, MD;  Location: [facility];  Service: Endoscopy;  Laterality: N/A;   FOOT SURGERY     Chain saw accident    Social History    Tobacco Use   Smoking status: Never   Smokeless tobacco: Never  Vaping Use   Vaping Use: Never used  Substance Use Topics   Alcohol use: No    Alcohol/week: 0.0 standard drinks   Drug use: No    Family History  Problem Relation Age of Onset   Hypertension Father        Living   Heart disease Mother        Living   Pulmonary embolism Mother    Anemia Mother    Hypertension Other        Paternal Side   Hypertension Sister        x2   Migraines Sister    Allergies Son        x1    No Known Allergies  Medication list has been reviewed and updated.  Current Outpatient Medications on File Prior to Visit  Medication Sig Dispense Refill   aspirin EC 81 MG tablet Take 81 mg by mouth daily. Takes two 81 mg tablets in morning.     carvedilol (COREG) 12.5 MG tablet TAKE 1 TABLET BY MOUTH AT BEDTIME 30 tablet 5   furosemide (LASIX) 40 MG tablet TAKE 1 TABLET BY MOUTH TWICE DAILY 60 tablet 5   levothyroxine (SYNTHROID) 50 MCG tablet Take 1 tablet (50 mcg total) by mouth daily before breakfast. 90 tablet 3  losartan-hydrochlorothiazide (HYZAAR) 100-12.5 MG tablet Take 1 tablet by mouth daily. 90 tablet 3   losartan-hydrochlorothiazide (HYZAAR) 50-12.5 MG tablet Take 1 tablet by mouth daily. 30 tablet 0   potassium chloride SA (KLOR-CON) 20 MEQ tablet TAKE 1 TABLET BY MOUTH DAILY 30 tablet 5   No current facility-administered medications on file prior to visit.    Review of Systems:  As per HPI- otherwise negative.   Physical Examination: Vitals:   08/30/21 1535  BP: 136/80  Pulse: 82  Resp: 16  Temp: 98.2 F (36.8 C)  SpO2: 96%   Vitals:   08/30/21 1535  Weight: (!) 357 lb 9.6 oz (162.2 kg)  Height: 6\' 1"  (1.854 m)   Body mass index is 47.18 kg/m. Ideal Body Weight: Weight in (lb) to have BMI = 25: 189.1  GEN: no acute distress.  Obese, looks well HEENT: Atraumatic, Normocephalic.  Ears and Nose: No external deformity. CV: RRR, No M/G/R. No JVD. No  thrill. No extra heart sounds. PULM: CTA B, no wheezes, crackles, rhonchi. No retractions. No resp. distress. No accessory muscle use. ABD: S, NT, ND, +BS. No rebound. No HSM. EXTR: No c/c/e PSYCH: Normally interactive. Conversant.    Assessment and Plan: Essential hypertension - Plan: CBC, Comprehensive metabolic panel, carvedilol (COREG) 12.5 MG tablet  Prediabetes - Plan: Hemoglobin A1c  Acquired hypothyroidism - Plan: TSH  Screening for prostate cancer - Plan: PSA  Morbid obesity (HCC)  Screening for hyperlipidemia - Plan: Lipid panel  Patient seen today for follow-up.  Blood pressures are well controlled Labs are pending as above Follow-up in 6 months  Signed , MD  Received labs 11/17- letter to pt  Results for orders placed or performed in visit on 08/30/21  CBC  Result Value Ref Range   WBC 8.0 4.0 - 10.5 K/uL   RBC 4.54 4.22 - 5.81 Mil/uL   Platelets 223.0 150.0 - 400.0 K/uL   Hemoglobin 14.7 13.0 - 17.0 g/dL   HCT 09/01/21 07.3 - 71.0 %   MCV 93.5 78.0 - 100.0 fl   MCHC 34.7 30.0 - 36.0 g/dL   RDW 62.6 94.8 - 54.6 %  Comprehensive metabolic panel  Result Value Ref Range   Sodium 139 135 - 145 mEq/L   Potassium 4.4 3.5 - 5.1 mEq/L   Chloride 99 96 - 112 mEq/L   CO2 32 19 - 32 mEq/L   Glucose, Bld 95 70 - 99 mg/dL   BUN 23 6 - 23 mg/dL   Creatinine, Ser 27.0 0.40 - 1.50 mg/dL   Total Bilirubin 0.7 0.2 - 1.2 mg/dL   Alkaline Phosphatase 79 39 - 117 U/L   AST 19 0 - 37 U/L   ALT 21 0 - 53 U/L   Total Protein 7.1 6.0 - 8.3 g/dL   Albumin 4.5 3.5 - 5.2 g/dL   GFR 3.50 09.38 mL/min   Calcium 9.6 8.4 - 10.5 mg/dL  Hemoglobin >18.29  Result Value Ref Range   Hgb A1c MFr Bld 6.2 4.6 - 6.5 %  Lipid panel  Result Value Ref Range   Cholesterol 177 0 - 200 mg/dL   Triglycerides H3Z (H) 0.0 - 149.0 mg/dL   HDL 169.6 78.93 mg/dL   VLDL >81.01 0.0 - 75.1 mg/dL   LDL Cholesterol 02.5 (H) 0 - 99 mg/dL   Total CHOL/HDL Ratio 4    NonHDL 131.53   TSH   Result Value Ref Range   TSH 2.95 0.35 - 5.50 uIU/mL  PSA  Result Value Ref Range   PSA 0.18 0.10 - 4.00 ng/mL

## 2021-08-30 ENCOUNTER — Encounter: Payer: Self-pay | Admitting: Family Medicine

## 2021-08-30 ENCOUNTER — Ambulatory Visit: Payer: BC Managed Care – PPO | Admitting: Family Medicine

## 2021-08-30 ENCOUNTER — Other Ambulatory Visit: Payer: Self-pay

## 2021-08-30 VITALS — BP 136/80 | HR 82 | Temp 98.2°F | Resp 16 | Ht 73.0 in | Wt 357.6 lb

## 2021-08-30 DIAGNOSIS — Z125 Encounter for screening for malignant neoplasm of prostate: Secondary | ICD-10-CM

## 2021-08-30 DIAGNOSIS — R7303 Prediabetes: Secondary | ICD-10-CM

## 2021-08-30 DIAGNOSIS — Z13220 Encounter for screening for lipoid disorders: Secondary | ICD-10-CM

## 2021-08-30 DIAGNOSIS — E039 Hypothyroidism, unspecified: Secondary | ICD-10-CM | POA: Diagnosis not present

## 2021-08-30 DIAGNOSIS — I10 Essential (primary) hypertension: Secondary | ICD-10-CM

## 2021-08-30 MED ORDER — CARVEDILOL 12.5 MG PO TABS: 12.5000 mg | ORAL_TABLET | Freq: Every day | ORAL | 3 refills | Status: DC

## 2021-08-31 LAB — COMPREHENSIVE METABOLIC PANEL
ALT: 21 U/L (ref 0–53)
AST: 19 U/L (ref 0–37)
Albumin: 4.5 g/dL (ref 3.5–5.2)
Alkaline Phosphatase: 79 U/L (ref 39–117)
BUN: 23 mg/dL (ref 6–23)
CO2: 32 mEq/L (ref 19–32)
Calcium: 9.6 mg/dL (ref 8.4–10.5)
Chloride: 99 mEq/L (ref 96–112)
Creatinine, Ser: 1.23 mg/dL (ref 0.40–1.50)
GFR: 64.05 mL/min (ref >60.00)
Glucose, Bld: 95 mg/dL (ref 70–99)
Potassium: 4.4 mEq/L (ref 3.5–5.1)
Sodium: 139 mEq/L (ref 135–145)
Total Bilirubin: 0.7 mg/dL (ref 0.2–1.2)
Total Protein: 7.1 g/dL (ref 6.0–8.3)

## 2021-08-31 LAB — LIPID PANEL
Cholesterol: 177 mg/dL (ref 0–200)
HDL: 45.7 mg/dL (ref >39.00)
LDL Cholesterol: 100 mg/dL — ABNORMAL HIGH (ref 0–99)
NonHDL: 131.53
Total CHOL/HDL Ratio: 4
Triglycerides: 158 mg/dL — ABNORMAL HIGH (ref 0.0–149.0)
VLDL: 31.6 mg/dL (ref 0.0–40.0)

## 2021-08-31 LAB — CBC
HCT: 42.5 % (ref 39.0–52.0)
Hemoglobin: 14.7 g/dL (ref 13.0–17.0)
MCHC: 34.7 g/dL (ref 30.0–36.0)
MCV: 93.5 fl (ref 78.0–100.0)
Platelets: 223 10*3/uL (ref 150.0–400.0)
RBC: 4.54 Mil/uL (ref 4.22–5.81)
RDW: 13.2 % (ref 11.5–15.5)
WBC: 8 10*3/uL (ref 4.0–10.5)

## 2021-08-31 LAB — HEMOGLOBIN A1C: Hgb A1c MFr Bld: 6.2 % (ref 4.6–6.5)

## 2021-08-31 LAB — TSH: TSH: 2.95 u[IU]/mL (ref 0.35–5.50)

## 2021-08-31 LAB — PSA: PSA: 0.18 ng/mL (ref 0.10–4.00)

## 2021-11-08 ENCOUNTER — Other Ambulatory Visit: Payer: Self-pay | Admitting: Family Medicine

## 2021-11-08 DIAGNOSIS — R600 Localized edema: Secondary | ICD-10-CM

## 2022-02-27 NOTE — Progress Notes (Addendum)
[facility] at [facility] [address] [place], [place] [address] [phone]  Date:  02/28/2022   Name:  [name]   DOB:  Nov 14, 1960   MRN:  [id number]  PCP:  Pearline Cables, MD    Chief Complaint: 6 month follow up (Concerns/ questions: 1. Place on the left ear that he would like looked at. 2. He thinks Gout has returned. /)   History of Present Illness:  ASHE GAGO is a 61 y.o. very pleasant male patient who presents with the following:  Patient seen today for periodic follow-up-  history of hypertension, DVT, hypothyroidism, sleep apnea, obesity, prediabetes He has been seen by hematology, blood clots thought to be idiopathic.  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  He thinks has been present for 4 to 5 months.  It is tender  Lab Results  Component Value Date   HGBA1C 6.2 08/30/2021    Patient Active Problem List   Diagnosis Date Noted   Prediabetes 02/14/2020   Acute deep vein thrombosis (DVT) of left lower extremity (HCC) 02/05/2016   Acute pulmonary embolism (HCC) 02/04/2016   Lung nodule 02/04/2016   Obesity 02/04/2016   Idiopathic hypothyroidism 02/09/2015   Edema of both legs 02/09/2015   Sleep apnea 03/21/2013   Hypertension,  benign 03/21/2013    Past Medical History:  Diagnosis Date   Deep vein thrombosis (DVT) of left lower extremity (HCC)    GERD (gastroesophageal reflux disease)    History of chicken pox    Hypertension    Measles    Pulmonary embolism (HCC)    Sleep apnea    Thyroid disease    Unfounded    Past Surgical History:  Procedure Laterality Date   COLONOSCOPY WITH PROPOFOL N/A 11/29/2016   Procedure: COLONOSCOPY WITH PROPOFOL;  Surgeon: Bernette Redbird, MD;  Location: [facility];  Service: Endoscopy;  Laterality: N/A;   FOOT SURGERY     Chain saw accident    Social History   Tobacco Use   Smoking status: Never   Smokeless tobacco: Never  Vaping Use   Vaping Use: Never used  Substance Use Topics   Alcohol use: No    Alcohol/week: 0.0 standard drinks   Drug use: No    Family History  Problem Relation Age of Onset   Hypertension Father        Living   Heart disease Mother        Living   Pulmonary embolism Mother    Anemia Mother    Hypertension Other        Paternal Side   Hypertension Sister        x2   Migraines Sister  Allergies Son        x1    No Known Allergies  Medication list has been reviewed and updated.  Current Outpatient Medications on File Prior to Visit  Medication Sig Dispense Refill   aspirin EC 81 MG tablet Take 81 mg by mouth daily. Takes two 81 mg tablets in morning.     carvedilol (COREG) 12.5 MG tablet Take 1 tablet (12.5 mg total) by mouth daily. 90 tablet 3   furosemide (LASIX) 40 MG tablet TAKE 1 TABLET BY MOUTH TWICE DAILY 180 tablet 1   levothyroxine (SYNTHROID) 50 MCG tablet Take 1 tablet (50 mcg total) by mouth daily before breakfast. 90 tablet 3   losartan-hydrochlorothiazide (HYZAAR) 100-12.5 MG tablet Take 1 tablet by mouth daily. 90 tablet 3   potassium chloride SA (KLOR-CON M) 20 MEQ tablet TAKE 1 TABLET BY MOUTH DAILY 90 tablet 1   No current facility-administered medications on file prior to visit.    Review of  Systems:  As per HPI- otherwise negative.   Physical Examination: Vitals:   02/28/22 1534  BP: 122/80  Pulse: 67  Resp: 18  Temp: 98.3 F (36.8 C)  SpO2: 96%   Vitals:   02/28/22 1534  Weight: (!) 364 lb 3.2 oz (165.2 kg)   Body mass index is 48.05 kg/m. Ideal Body Weight:    GEN: no acute distress. Obese, looks well  HEENT: Atraumatic, Normocephalic.  There is a pearly appearing papule on the superior pinna of the left ear.  It may be a cyst but also consider skin cancer Ears and Nose: No external deformity. CV: RRR, No M/G/R. No JVD. No thrill. No extra heart sounds. PULM: CTA B, no wheezes, crackles, rhonchi. No retractions. No resp. distress. No accessory muscle use. ABD: S, NT, ND EXTR: No c/c/e PSYCH: Normally interactive. Conversant.  Pes planus bilaterally.  Strong pedal pulses bilaterally.  He notes some minor pain with palpation of the metatarsal joints, no redness or heat  Assessment and Plan: Essential hypertension - Plan: Basic metabolic panel  Prediabetes - Plan: Hemoglobin A1c  Acquired hypothyroidism  Morbid obesity (HCC)  Sleep apnea, unspecified type  Acute idiopathic gout involving toe, unspecified laterality - Plan: Uric acid, predniSONE (DELTASONE) 20 MG tablet  Skin lesion of left ear - Plan: Ambulatory referral to Dermatology  Patient seen today with a couple of concerns.  Routine labs are pending as above We will treat with prednisone for gout.  Avoid colchicine due to drug interaction Referral to dermatology We discussed getting a coronary calcium score, I think this would be potentially beneficial for him.  He is interested but would like to think about this first  Signed Abbe Amsterdam, MD  Received pt labs 5/18- letter to pt as he does not have mychart   Results for orders placed or performed in visit on 02/28/22  Basic metabolic panel  Result Value Ref Range   Sodium 137 135 - 145 mEq/L   Potassium 4.2 3.5 - 5.1 mEq/L    Chloride 100 96 - 112 mEq/L   CO2 29 19 - 32 mEq/L   Glucose, Bld 96 70 - 99 mg/dL   BUN 19 6 - 23 mg/dL   Creatinine, Ser 2.83 0.40 - 1.50 mg/dL   GFR 66.29 >47.65 mL/min   Calcium 9.1 8.4 - 10.5 mg/dL  Hemoglobin Y6T  Result Value Ref Range   Hgb A1c MFr Bld 6.4 4.6 - 6.5 %  Uric acid  Result Value Ref Range  Uric Acid, Serum 9.2 (H) 4.0 - 7.8 mg/dL

## 2022-02-28 ENCOUNTER — Ambulatory Visit: Payer: BC Managed Care – PPO | Admitting: Family Medicine

## 2022-02-28 VITALS — BP 122/80 | HR 67 | Temp 98.3°F | Resp 18 | Wt 364.2 lb

## 2022-02-28 DIAGNOSIS — G4730 Sleep apnea, unspecified: Secondary | ICD-10-CM

## 2022-02-28 DIAGNOSIS — M10079 Idiopathic gout, unspecified ankle and foot: Secondary | ICD-10-CM

## 2022-02-28 DIAGNOSIS — I10 Essential (primary) hypertension: Secondary | ICD-10-CM

## 2022-02-28 DIAGNOSIS — E039 Hypothyroidism, unspecified: Secondary | ICD-10-CM | POA: Diagnosis not present

## 2022-02-28 DIAGNOSIS — R7303 Prediabetes: Secondary | ICD-10-CM | POA: Diagnosis not present

## 2022-02-28 DIAGNOSIS — H6192 Disorder of left external ear, unspecified: Secondary | ICD-10-CM

## 2022-02-28 MED ORDER — PREDNISONE 20 MG PO TABS: ORAL_TABLET | ORAL | 0 refills | Status: DC

## 2022-02-28 NOTE — Patient Instructions (Addendum)
It was good to see you today- I will be in touch with your labs asap ?Use the prednisone as directed for your gout- let me know if your gout is not responding soon  ?We will check your uric acid level for you ? ?We might consider getting a coronary calcium score for you- let me know if you would like me to order this test for you.   ?

## 2022-03-01 LAB — BASIC METABOLIC PANEL
BUN: 19 mg/dL (ref 6–23)
CO2: 29 mEq/L (ref 19–32)
Calcium: 9.1 mg/dL (ref 8.4–10.5)
Chloride: 100 mEq/L (ref 96–112)
Creatinine, Ser: 1.2 mg/dL (ref 0.40–1.50)
GFR: 65.75 mL/min (ref >60.00)
Glucose, Bld: 96 mg/dL (ref 70–99)
Potassium: 4.2 mEq/L (ref 3.5–5.1)
Sodium: 137 mEq/L (ref 135–145)

## 2022-03-01 LAB — HEMOGLOBIN A1C: Hgb A1c MFr Bld: 6.4 % (ref 4.6–6.5)

## 2022-03-01 LAB — URIC ACID: Uric Acid, Serum: 9.2 mg/dL — ABNORMAL HIGH (ref 4.0–7.8)

## 2022-03-27 ENCOUNTER — Telehealth: Payer: Self-pay

## 2022-03-27 DIAGNOSIS — I10 Essential (primary) hypertension: Secondary | ICD-10-CM

## 2022-03-27 DIAGNOSIS — R600 Localized edema: Secondary | ICD-10-CM

## 2022-03-27 DIAGNOSIS — E039 Hypothyroidism, unspecified: Secondary | ICD-10-CM

## 2022-03-27 DIAGNOSIS — R7989 Other specified abnormal findings of blood chemistry: Secondary | ICD-10-CM

## 2022-03-27 MED ORDER — LEVOTHYROXINE SODIUM 50 MCG PO TABS: 50.0000 ug | ORAL_TABLET | Freq: Every day | ORAL | 1 refills | Status: DC

## 2022-03-27 MED ORDER — FUROSEMIDE 40 MG PO TABS: 40.0000 mg | ORAL_TABLET | Freq: Two times a day (BID) | ORAL | 1 refills | Status: DC

## 2022-03-27 MED ORDER — CARVEDILOL 12.5 MG PO TABS: 12.5000 mg | ORAL_TABLET | Freq: Every day | ORAL | 1 refills | Status: DC

## 2022-03-27 MED ORDER — LOSARTAN POTASSIUM-HCTZ 100-12.5 MG PO TABS: 1.0000 | ORAL_TABLET | Freq: Every day | ORAL | 1 refills | Status: DC

## 2022-03-27 MED ORDER — POTASSIUM CHLORIDE CRYS ER 20 MEQ PO TBCR: 20.0000 meq | EXTENDED_RELEASE_TABLET | Freq: Every day | ORAL | 1 refills | Status: DC

## 2022-03-27 NOTE — Telephone Encounter (Signed)
Needing rx's sent to [facility]. Rx's sent as 90 day supply.

## 2022-06-06 ENCOUNTER — Other Ambulatory Visit: Payer: Self-pay | Admitting: Family Medicine

## 2022-06-06 DIAGNOSIS — R600 Localized edema: Secondary | ICD-10-CM

## 2022-07-04 ENCOUNTER — Ambulatory Visit: Payer: BC Managed Care – PPO | Admitting: Family

## 2022-07-04 VITALS — BP 150/78 | HR 60 | Temp 98.0°F | Resp 18 | Ht 73.0 in | Wt 361.8 lb

## 2022-07-04 DIAGNOSIS — M109 Gout, unspecified: Secondary | ICD-10-CM | POA: Insufficient documentation

## 2022-07-04 LAB — URIC ACID: Uric Acid, Serum: 9.9 mg/dL — ABNORMAL HIGH (ref 4.0–7.8)

## 2022-07-04 MED ORDER — MELOXICAM 7.5 MG PO TABS: 7.5000 mg | ORAL_TABLET | Freq: Every day | ORAL | 0 refills | Status: DC

## 2022-07-04 NOTE — Assessment & Plan Note (Signed)
New.  Suspect gout but could also be osteoarthritis.  Will obtain uric acid level.  Begin meloxicam once daily.  If uric acid level is elevated would consider additional treatment for gout.  Pt was given information on a low purine diet.

## 2022-07-04 NOTE — Progress Notes (Signed)
Subjective:     Patient ID: [name], male    DOB: 08-May-1961, 61 y.o.   MRN: [id number]  Chief Complaint  Patient presents with   Knee Pain    Right onset: 4 days    Knee Pain    Patient is in today for right knee pain.  Pain began the Friday of Labor day weekend.  On Sunday he had some knee pain.  Pain since that time has been intermittent.  This past Sunday he had significant right knee pain. Put a heating pad on it and tylenol and fell asleep.  Monday pain bothered him at work. Yesterday he woke up with the pain.  Today it feels OK.    Health Maintenance Due  Topic Date Due   Zoster Vaccines- Shingrix (1 of 2) Never done   INFLUENZA VACCINE  Never done    Past Medical History:  Diagnosis Date   Deep vein thrombosis (DVT) of left lower extremity (HCC)    GERD (gastroesophageal reflux disease)    History of chicken pox    Hypertension    Measles    Pulmonary embolism (HCC)    Sleep apnea    Thyroid disease    Unfounded    Past Surgical History:  Procedure Laterality Date   COLONOSCOPY WITH PROPOFOL N/A 11/29/2016   Procedure: COLONOSCOPY WITH PROPOFOL;  Surgeon: Ronald Lobo, MD;  Location: [facility];  Service: Endoscopy;  Laterality: N/A;   FOOT SURGERY     Chain saw accident    Family History  Problem Relation Age of Onset   Hypertension Father        Living   Heart disease Mother        Living   Pulmonary embolism Mother    Anemia Mother    Hypertension Other        Paternal Side   Hypertension Sister        x2   Migraines Sister    Allergies Son        x1    Social History   Socioeconomic History   Marital status: Married    Spouse name: Not on file   Number of children: Not on file   Years of education: Not on file   Highest education level: Not on file  Occupational History   Not on file  Tobacco Use   Smoking status: Never   Smokeless tobacco: Never  Vaping Use   Vaping Use: Never used  Substance and Sexual Activity    Alcohol use: No    Alcohol/week: 0.0 standard drinks of alcohol   Drug use: No   Sexual activity: Never  Other Topics Concern   Not on file  Social History Narrative   Not on file   Social Determinants of Health   Financial Resource Strain: Not on file  Food Insecurity: Not on file  Transportation Needs: Not on file  Physical Activity: Not on file  Stress: Not on file  Social Connections: Not on file  Intimate Partner Violence: Not on file    Outpatient Medications Prior to Visit  Medication Sig Dispense Refill   aspirin EC 81 MG tablet Take 81 mg by mouth daily. Takes two 81 mg tablets in morning.     carvedilol (COREG) 12.5 MG tablet Take 1 tablet (12.5 mg total) by mouth daily. 90 tablet 1   furosemide (LASIX) 40 MG tablet TAKE 1 TABLET BY MOUTH 2 TIMES A DAY 180 tablet 1   levothyroxine (SYNTHROID)  50 MCG tablet Take 1 tablet (50 mcg total) by mouth daily before breakfast. 90 tablet 1   losartan-hydrochlorothiazide (HYZAAR) 100-12.5 MG tablet Take 1 tablet by mouth daily. 90 tablet 1   potassium chloride SA (KLOR-CON M) 20 MEQ tablet TAKE 1 TABLET BY MOUTH EVERY DAY 90 tablet 1   predniSONE (DELTASONE) 20 MG tablet Take 40 mg by mouth daily until gout symptoms improve (2-3 days) and then take 20 mg daily for 5 days more 20 tablet 0   No facility-administered medications prior to visit.    No Known Allergies  ROS     Objective:    Physical Exam Constitutional:      General: He is not in acute distress.    Appearance: He is well-developed.  HENT:     Head: Normocephalic and atraumatic.  Cardiovascular:     Rate and Rhythm: Normal rate and regular rhythm.     Heart sounds: No murmur heard. Pulmonary:     Effort: Pulmonary effort is normal. No respiratory distress.     Breath sounds: Normal breath sounds. No wheezing or rales.  Musculoskeletal:     Right lower leg: Edema present.     Left lower leg: Edema present.     Comments: Mild swelling of right knee. No  warmth or erythema  Skin:    General: Skin is warm and dry.  Neurological:     Mental Status: He is alert and oriented to person, place, and time.  Psychiatric:        Behavior: Behavior normal.        Thought Content: Thought content normal.     BP (!) 150/78   Pulse 60   Temp 98 F (36.7 C)   Resp 18   Ht 6\' 1"  (1.854 m)   Wt (!) 361 lb 12.8 oz (164.1 kg)   SpO2 99%   BMI 47.73 kg/m  Wt Readings from Last 3 Encounters:  07/04/22 (!) 361 lb 12.8 oz (164.1 kg)  02/28/22 (!) 364 lb 3.2 oz (165.2 kg)  08/30/21 (!) 357 lb 9.6 oz (162.2 kg)       Assessment & Plan:   Problem List Items Addressed This Visit       Unprioritized   Acute gout of right knee - Primary    New.  Suspect gout but could also be osteoarthritis.  Will obtain uric acid level.  Begin meloxicam once daily.  If uric acid level is elevated would consider additional treatment for gout.  Pt was given information on a low purine diet.      Relevant Medications   meloxicam (MOBIC) 7.5 MG tablet   Other Relevant Orders   Uric acid    I have discontinued 09/01/21 R. Vestal's predniSONE. I am also having him start on meloxicam. Additionally, I am having him maintain his aspirin EC, losartan-hydrochlorothiazide, carvedilol, levothyroxine, furosemide, and potassium chloride SA.  Meds ordered this encounter  Medications   meloxicam (MOBIC) 7.5 MG tablet    Sig: Take 1 tablet (7.5 mg total) by mouth daily.    Dispense:  14 tablet    Refill:  0    Order Specific Question:   Supervising Provider    Answer:   Aurther Loft A [[id number]]

## 2022-07-04 NOTE — Patient Instructions (Signed)
Please begin Meloxicam once daily for knee pain.  Complete lab work prior to leaving. Call if symptoms worsen or if not improved in 1 week.

## 2022-07-05 ENCOUNTER — Telehealth: Payer: Self-pay | Admitting: Family

## 2022-07-05 MED ORDER — ALLOPURINOL 100 MG PO TABS: 100.0000 mg | ORAL_TABLET | Freq: Every day | ORAL | 2 refills | Status: DC

## 2022-07-05 MED ORDER — PREDNISONE 20 MG PO TABS: ORAL_TABLET | ORAL | 0 refills | Status: DC

## 2022-07-05 NOTE — Telephone Encounter (Signed)
Please advise pt that I reviewed his lab work.  His uric acid level (gout) was very high.  I would recommend that he take prednisone (steroid) and that he begin allopurinol once daily.  Allopurinol will keep down his Uric acid level and help to prevent gout flares.  He can stop the meloxicam while taking steroids then restart when steroids are complete.  Keep upcoming appointment with Dr. Lorelei Pont in November as scheduled.

## 2022-07-05 NOTE — Telephone Encounter (Signed)
Patient notified of results, new prescriptions and provider's comments and instructions.  Her verbalized understanding.

## 2022-08-21 ENCOUNTER — Ambulatory Visit (HOSPITAL_BASED_OUTPATIENT_CLINIC_OR_DEPARTMENT_OTHER)
Admission: RE | Admit: 2022-08-21 | Discharge: 2022-08-21 | Disposition: A | Discharge status: Home / Self Care | Payer: BC Managed Care – PPO | Source: Ambulatory Visit | Attending: Medical | Admitting: Medical

## 2022-08-21 ENCOUNTER — Ambulatory Visit: Payer: BC Managed Care – PPO | Admitting: Medical

## 2022-08-21 ENCOUNTER — Telehealth: Payer: Self-pay | Admitting: Internal Medicine

## 2022-08-21 VITALS — BP 109/60 | HR 60 | Temp 98.5°F | Resp 18 | Ht 73.0 in | Wt 356.0 lb

## 2022-08-21 DIAGNOSIS — L089 Local infection of the skin and subcutaneous tissue, unspecified: Secondary | ICD-10-CM

## 2022-08-21 DIAGNOSIS — M7989 Other specified soft tissue disorders: Secondary | ICD-10-CM | POA: Diagnosis not present

## 2022-08-21 DIAGNOSIS — T148XXA Other injury of unspecified body region, initial encounter: Secondary | ICD-10-CM

## 2022-08-21 LAB — CBC WITH DIFFERENTIAL/PLATELET
Basophils Absolute: 0 10*3/uL (ref 0.0–0.1)
Basophils Relative: 0.5 % (ref 0.0–3.0)
Eosinophils Absolute: 0.4 10*3/uL (ref 0.0–0.7)
Eosinophils Relative: 4.8 % (ref 0.0–5.0)
HCT: 42.8 % (ref 39.0–52.0)
Hemoglobin: 14.5 g/dL (ref 13.0–17.0)
Lymphocytes Relative: 28.6 % (ref 12.0–46.0)
Lymphs Abs: 2.1 10*3/uL (ref 0.7–4.0)
MCHC: 34 g/dL (ref 30.0–36.0)
MCV: 94.7 fl (ref 78.0–100.0)
Monocytes Absolute: 0.7 10*3/uL (ref 0.1–1.0)
Monocytes Relative: 8.9 % (ref 3.0–12.0)
Neutro Abs: 4.2 10*3/uL (ref 1.4–7.7)
Neutrophils Relative %: 57.2 % (ref 43.0–77.0)
Platelets: 237 10*3/uL (ref 150.0–400.0)
RBC: 4.51 Mil/uL (ref 4.22–5.81)
RDW: 12.9 % (ref 11.5–15.5)
WBC: 7.4 10*3/uL (ref 4.0–10.5)

## 2022-08-21 MED ORDER — DOXYCYCLINE HYCLATE 100 MG PO TABS: 100.0000 mg | ORAL_TABLET | Freq: Two times a day (BID) | ORAL | 0 refills | Status: DC

## 2022-08-21 MED ORDER — CEFTRIAXONE SODIUM 1 G IJ SOLR
1.0000 g | Freq: Once | INTRAMUSCULAR | Status: AC
  Administered 2022-08-21: 1 g via INTRAMUSCULAR

## 2022-08-21 NOTE — Telephone Encounter (Signed)
Radiology called, US showed: 1. Acute occlusive thrombus within the gastrocnemius vein in the calf. 2. Minimal chronic thrombus in the distal femoral and popliteal vein. No acute DVT proximal to the knee.  Due to  h/o DVT in the same leg few years ago, I think start anticoagulation for now is prudent Currently w/ no CP-SOB  Rx send for Eliquis 5mg   2 tabs bid x 1 week, then 1 po BID #60, no RF  [facility] @ [address] [place], [place] [address] Phone: [phone]  Pt verbalized understanding and plans to pick up rx tonight

## 2022-08-21 NOTE — Patient Instructions (Addendum)
Left lower ext skin infection/pretibial region. Degree of infection concerning for cellulitis with ulcer. Rocephin 1 gram IM in office. Doxycycline 100 mg twice daily for 10 days.  Up to date on tetanus vaccine. Get wound culture today. Also decided to get cbc.   Keep area covered.   Follow up thurdsay or Friday with pcp. If pcp not available then with me 1:40 pm. Want to make sure area getting better. Might need to refer to wound care.  With degree of calf swelling and hx of dvt reported decided to go ahead and order [place] lower ext today.

## 2022-08-21 NOTE — Addendum Note (Signed)
Addended by: Jeronimo Greaves on: 08/21/2022 02:57 PM   Modules accepted: Orders

## 2022-08-21 NOTE — Progress Notes (Signed)
Venous doppler preliminary result was discussed with ED provider MD Deno Etienne and Charge RN Athena Masse since the ordering  PCP office closed and was advised to floow up with the ordering provider since patient is not primarily for Emergency room.  Patient was instructed to follow up with the provider tomorrow 08/22/22

## 2022-08-21 NOTE — Progress Notes (Addendum)
Subjective:    Patient ID: [name], male    DOB: 11/17/1960, 61 y.o.   MRN: [id number]  HPI  Pt in for left lower extremity. He bumped his left mid pretibal area on plow one month ago. He is up to date on tetanus. Describe superficial abrasion that turned into a scab.    Pt is not diabetic but borderline. No fever chills or sweats. Creamy yellow dc in morning from mid pretibia area  Gradual swelling to calf over past 2 weeks. Eventually scab formed over intial abrasion area. When scab eventually came off shallow area of breakdown ulcer present.    Review of Systems  Constitutional:  Negative for chills, fatigue and fever.  Respiratory:  Negative for cough, chest tightness, shortness of breath and wheezing.   Cardiovascular:  Negative for chest pain and palpitations.  Musculoskeletal:  Negative for back pain.  Skin:        See hpi.  Neurological:  Negative for facial asymmetry.    Past Medical History:  Diagnosis Date   Deep vein thrombosis (DVT) of left lower extremity (HCC)    GERD (gastroesophageal reflux disease)    History of chicken pox    Hypertension    Measles    Pulmonary embolism (HCC)    Sleep apnea    Thyroid disease    Unfounded     Social History   Socioeconomic History   Marital status: Married    Spouse name: Not on file   Number of children: Not on file   Years of education: Not on file   Highest education level: Not on file  Occupational History   Not on file  Tobacco Use   Smoking status: Never   Smokeless tobacco: Never  Vaping Use   Vaping Use: Never used  Substance and Sexual Activity   Alcohol use: No    Alcohol/week: 0.0 standard drinks of alcohol   Drug use: No   Sexual activity: Never  Other Topics Concern   Not on file  Social History Narrative   Not on file   Social Determinants of Health   Financial Resource Strain: Not on file  Food Insecurity: Not on file  Transportation Needs: Not on file  Physical  Activity: Not on file  Stress: Not on file  Social Connections: Not on file  Intimate Partner Violence: Not on file    Past Surgical History:  Procedure Laterality Date   COLONOSCOPY WITH PROPOFOL N/A 11/29/2016   Procedure: COLONOSCOPY WITH PROPOFOL;  Surgeon: Ronald Lobo, MD;  Location: [facility];  Service: Endoscopy;  Laterality: N/A;   FOOT SURGERY     Chain saw accident    Family History  Problem Relation Age of Onset   Hypertension Father        Living   Heart disease Mother        Living   Pulmonary embolism Mother    Anemia Mother    Hypertension Other        Paternal Side   Hypertension Sister        x2   Migraines Sister    Allergies Son        x1    No Known Allergies  Current Outpatient Medications on File Prior to Visit  Medication Sig Dispense Refill   allopurinol (ZYLOPRIM) 100 MG tablet Take 1 tablet (100 mg total) by mouth daily. 30 tablet 2   aspirin EC 81 MG tablet Take 81 mg by mouth daily. Takes two  81 mg tablets in morning.     carvedilol (COREG) 12.5 MG tablet Take 1 tablet (12.5 mg total) by mouth daily. 90 tablet 1   furosemide (LASIX) 40 MG tablet TAKE 1 TABLET BY MOUTH 2 TIMES A DAY 180 tablet 1   levothyroxine (SYNTHROID) 50 MCG tablet Take 1 tablet (50 mcg total) by mouth daily before breakfast. 90 tablet 1   losartan-hydrochlorothiazide (HYZAAR) 100-12.5 MG tablet Take 1 tablet by mouth daily. 90 tablet 1   meloxicam (MOBIC) 7.5 MG tablet Take 1 tablet (7.5 mg total) by mouth daily. 14 tablet 0   potassium chloride SA (KLOR-CON M) 20 MEQ tablet TAKE 1 TABLET BY MOUTH EVERY DAY 90 tablet 1   predniSONE (DELTASONE) 20 MG tablet Take 40 mg by mouth daily until gout symptoms improve (2-3 days) and then take 20 mg daily for 5 days more 11 tablet 0   No current facility-administered medications on file prior to visit.    BP 109/60   Pulse 60   Temp 98.5 F (36.9 C)   Resp 18   Ht 6\' 1"  (1.854 m)   Wt (!) 356 lb (161.5 kg)   SpO2 96%    BMI 46.97 kg/m        Objective:   Physical Exam  General- No acute distress. Pleasant patient. Neck- Full range of motion, no jvd Lungs- Clear, even and unlabored. Heart- regular rate and rhythm. Neurologic- CNII- XII grossly intact.   Left mid pretibial area- 3 cm wide ulceration approximate(pinkish red area about 3 cm around wound. Most tender below breakdown). Water serosanguinous drainage presently. Left calf mild- moderate more swollen than rt side.      Assessment & Plan:   Patient Instructions  Left lower ext skin infection/pretibial region. Degree of infection concerning for cellulitis with ulcer. Rocephin 1 gram IM in office. Doxycycline 100 mg twice daily for 10 days.  Up to date on tetanus vaccine. Get wound culture today. Also decided to get cbc.   Keep area covered.   Follow up thurdsay or Friday with pcp. If pcp not available then with me 1:40 pm. Want to make sure area getting better. Might need to refer to wound care.  With degree of calf swelling and hx of dvt reported decided to go ahead and order Tuesday lower ext today.     [place], PA-C

## 2022-08-21 NOTE — Addendum Note (Signed)
Addended by: Kem Boroughs D on: 08/21/2022 03:04 PM   Modules accepted: Orders

## 2022-08-22 ENCOUNTER — Telehealth: Payer: Self-pay | Admitting: Family Medicine

## 2022-08-22 NOTE — Telephone Encounter (Signed)
Pt called stating that [name] had issued him a new medication for infection and just wanted to be sure that he was ok to take it along with his other medications. Please Advise.

## 2022-08-22 NOTE — Telephone Encounter (Signed)
Patient was advised by Revonda Standard

## 2022-08-22 NOTE — Telephone Encounter (Signed)
Initial Comment [name] has a stat ultrasound to report. Translation No Nurse Assessment Nurse: [name], RN, [name] Date/Time (Eastern Time): 08/21/2022 6:38:29 PM Is there an on-call provider listed? ---Yes Please list name of person reporting value (Lab Employee) and a contact number. ---[name], [phone] Please document the following items: Lab name Lab value (read back to lab to verify) Reference range for lab value Date and time blood was drawn Collect time of birth for bilirubin results ---Lab: Acute occlusive thrombus of the gastrocnenius Ref: Date: 1650 Previous: Please collect the patient contact information from the lab. (name, phone number and address) ---[name] Disp. Time [name] Time) Disposition Final User 08/21/2022 6:44:18 PM Paged On Call back to [facility], [place] 08/21/2022 6:44:38 PM Lab Call [name], RN, [name] Reason: Critical result 08/21/2022 6:46:06 PM Paged On Call back to [facility], RNCristal Deer 08/21/2022 7:00:18 PM Clinical Call Yes [name], RN, [name] Final Disposition 08/21/2022 7:00:18 PM Clinical Call Yes [name], RN, [name] PLEASE NOTE: All timestamps contained within this report are represented as [place] Standard Time. CONFIDENTIALTY NOTICE: This fax transmission is intended only for the addressee. It contains information that is legally privileged, confidential or otherwise protected from use or disclosure. If you are not the intended recipient, you are strictly prohibited from reviewing, disclosing, copying using or disseminating any of this information or taking any action in reliance on or regarding this information. If you have received this fax in error, please notify us immediately by telephone so that we can arrange for its return to [place]. Phone: [phone], Toll-Free: [phone], Fax: [phone] Page: 2 of 2 Call Id:  [id number] Paging DoctorName Phone DateTime Result/ Outcome Message Type Notes [name] [phone] 08/21/2022 6:44:18 PM Paged On Call Back to Call Center Doctor Paged [name], Access[employer]urse [phone] Critical lab [name] 08/21/2022 7:00:05 PM Spoke with On Call - General Message Result Reported critical, acknowledged. MD to follow

## 2022-08-22 NOTE — Telephone Encounter (Signed)
Pt called and lvm to return call 

## 2022-08-22 NOTE — Telephone Encounter (Signed)
Pt wants to know if he can take Elqiuis and aspirin

## 2022-08-24 ENCOUNTER — Encounter: Payer: Self-pay | Admitting: Medical

## 2022-08-24 ENCOUNTER — Ambulatory Visit: Payer: BC Managed Care – PPO | Admitting: Medical

## 2022-08-24 ENCOUNTER — Ambulatory Visit (HOSPITAL_BASED_OUTPATIENT_CLINIC_OR_DEPARTMENT_OTHER)
Admission: RE | Admit: 2022-08-24 | Discharge: 2022-08-24 | Disposition: A | Discharge status: Home / Self Care | Payer: BC Managed Care – PPO | Source: Ambulatory Visit | Attending: Medical | Admitting: Medical

## 2022-08-24 VITALS — BP 135/67 | HR 66 | Temp 98.1°F | Resp 18 | Ht 73.0 in | Wt 359.0 lb

## 2022-08-24 DIAGNOSIS — M7989 Other specified soft tissue disorders: Secondary | ICD-10-CM | POA: Diagnosis not present

## 2022-08-24 DIAGNOSIS — T148XXA Other injury of unspecified body region, initial encounter: Secondary | ICD-10-CM | POA: Insufficient documentation

## 2022-08-24 DIAGNOSIS — M898X6 Other specified disorders of bone, lower leg: Secondary | ICD-10-CM

## 2022-08-24 DIAGNOSIS — I82402 Acute embolism and thrombosis of unspecified deep veins of left lower extremity: Secondary | ICD-10-CM | POA: Diagnosis not present

## 2022-08-24 DIAGNOSIS — L089 Local infection of the skin and subcutaneous tissue, unspecified: Secondary | ICD-10-CM | POA: Diagnosis not present

## 2022-08-24 LAB — WOUND CULTURE
MICRO NUMBER:: 14155050
SPECIMEN QUALITY:: ADEQUATE

## 2022-08-24 MED ORDER — CEFTRIAXONE SODIUM 1 G IJ SOLR
1.0000 g | Freq: Once | INTRAMUSCULAR | Status: AC
  Administered 2022-08-24: 1 g via INTRAMUSCULAR

## 2022-08-24 MED ORDER — LEVOFLOXACIN 500 MG PO TABS: 500.0000 mg | ORAL_TABLET | Freq: Every day | ORAL | 0 refills | Status: DC

## 2022-08-24 NOTE — Progress Notes (Signed)
Subjective:    Patient ID: [name], male    DOB: Mar 01, 1961, 61 y.o.   MRN: [id number]  HPI  Pt in for follow up.  He has recent left lower ext dvt and cellulitis with mid tibia ulcer. Pt is on doxycycline antibiotic and gave rocephin 1 gram IM.  Pt is on eliquis(started by Dr. Drue Novel). Hx of PE in past and hx of DVT.  Culture grew out pseudomonas and gram positive cocci.  See last visit note on 08-21-2022.  No fever, no chills or sweats. No shortness of breath.       Review of Systems  Constitutional:  Negative for chills, fatigue and fever.  Respiratory:  Negative for cough, chest tightness, shortness of breath and wheezing.   Cardiovascular:  Negative for chest pain and palpitations.  Gastrointestinal:  Negative for abdominal pain.  Skin:        See hpi.  Neurological:  Positive for dizziness, numbness and headaches.  Hematological:  Positive for adenopathy. Bruises/bleeds easily.    Past Medical History:  Diagnosis Date   Deep vein thrombosis (DVT) of left lower extremity (HCC)    GERD (gastroesophageal reflux disease)    History of chicken pox    Hypertension    Measles    Pulmonary embolism (HCC)    Sleep apnea    Thyroid disease    Unfounded     Social History   Socioeconomic History   Marital status: Married    Spouse name: Not on file   Number of children: Not on file   Years of education: Not on file   Highest education level: Not on file  Occupational History   Not on file  Tobacco Use   Smoking status: Never   Smokeless tobacco: Never  Vaping Use   Vaping Use: Never used  Substance and Sexual Activity   Alcohol use: No    Alcohol/week: 0.0 standard drinks of alcohol   Drug use: No   Sexual activity: Never  Other Topics Concern   Not on file  Social History Narrative   Not on file   Social Determinants of Health   Financial Resource Strain: Not on file  Food Insecurity: Not on file  Transportation Needs: Not on file   Physical Activity: Not on file  Stress: Not on file  Social Connections: Not on file  Intimate Partner Violence: Not on file    Past Surgical History:  Procedure Laterality Date   COLONOSCOPY WITH PROPOFOL N/A 11/29/2016   Procedure: COLONOSCOPY WITH PROPOFOL;  Surgeon: Bernette Redbird, MD;  Location: [facility];  Service: Endoscopy;  Laterality: N/A;   FOOT SURGERY     Chain saw accident    Family History  Problem Relation Age of Onset   Hypertension Father        Living   Heart disease Mother        Living   Pulmonary embolism Mother    Anemia Mother    Hypertension Other        Paternal Side   Hypertension Sister        x2   Migraines Sister    Allergies Son        x1    No Known Allergies  Current Outpatient Medications on File Prior to Visit  Medication Sig Dispense Refill   allopurinol (ZYLOPRIM) 100 MG tablet Take 1 tablet (100 mg total) by mouth daily. 30 tablet 2   apixaban (ELIQUIS) 5 MG TABS tablet Take 5 mg  by mouth 2 (two) times daily.     aspirin EC 81 MG tablet Take 81 mg by mouth daily. Takes two 81 mg tablets in morning.     carvedilol (COREG) 12.5 MG tablet Take 1 tablet (12.5 mg total) by mouth daily. 90 tablet 1   furosemide (LASIX) 40 MG tablet TAKE 1 TABLET BY MOUTH 2 TIMES A DAY 180 tablet 1   levothyroxine (SYNTHROID) 50 MCG tablet Take 1 tablet (50 mcg total) by mouth daily before breakfast. 90 tablet 1   losartan-hydrochlorothiazide (HYZAAR) 100-12.5 MG tablet Take 1 tablet by mouth daily. 90 tablet 1   meloxicam (MOBIC) 7.5 MG tablet Take 1 tablet (7.5 mg total) by mouth daily. 14 tablet 0   potassium chloride SA (KLOR-CON M) 20 MEQ tablet TAKE 1 TABLET BY MOUTH EVERY DAY 90 tablet 1   predniSONE (DELTASONE) 20 MG tablet Take 40 mg by mouth daily until gout symptoms improve (2-3 days) and then take 20 mg daily for 5 days more (Patient not taking: Reported on 08/24/2022) 11 tablet 0   No current facility-administered medications on file prior  to visit.    BP 135/67 (BP Location: Right Arm, Patient Position: Sitting, Cuff Size: Large)   Pulse 66   Temp 98.1 F (36.7 C) (Oral)   Resp 18   Ht 6\' 1"  (1.854 m)   Wt (!) 359 lb (162.8 kg)   SpO2 97%   BMI 47.36 kg/m        Objective:   Physical Exam  General- No acute distress. Pleasant patient. Neck- Full range of motion, no jvd Lungs- Clear, even and unlabored. Heart- regular rate and rhythm. Neurologic- CNII- XII grossly intact.   Left mid pretibial area- 3 cm wide ulceration approximate( less pinkish red area around wound. Less  tender below breakdown). Prior  serosanguinous drainage now dry. Overall calf is still swollen but less than before.      Assessment & Plan:   Patient Instructions  Left lower ext infection/cellulitis with shallow wound. Overall does look some better. We gave you rocephin 1 gram today.  Based on culture results rx levofloxin antibiotic and stop doxycycline. Xray of left tibia today to assess bone under area of skin infection.  For dvt continue Eliquis. Went ahead and placed referral to hematogist.  Follow up next wed with Dr.  or sooner if needed.   Patsy Lager, PA-C

## 2022-08-24 NOTE — Patient Instructions (Addendum)
Left lower ext infection/cellulitis with shallow wound. Overall does look some better. We gave you rocephin 1 gram today.  Based on culture results rx levofloxin antibiotic and stop doxycycline. Xray of left tibia today to assess bone under area of skin infection.  For dvt continue Eliquis. Went ahead and placed referral to hematogist.  Follow up next wed with Dr. Patsy Lager  or sooner if needed.

## 2022-08-24 NOTE — Addendum Note (Signed)
Addended by: Maximino Sarin on: 08/24/2022 02:48 PM   Modules accepted: Orders

## 2022-08-26 NOTE — Progress Notes (Unsigned)
[facility] [address] [place], [place] [address] [phone]  Date:  08/29/2022   Name:  [name]   DOB:  April 06, 1961   MRN:  [id number]  PCP:  Pearline Cables, MD    Chief Complaint: No chief complaint on file.   History of Present Illness:  [name] is a 61 y.o. very pleasant male patient who presents with the following:  Patient seen today for periodic follow-up- history of hypertension, DVT, hypothyroidism, sleep apnea, obesity, prediabetes, gout Most recent visit with myself was in May of this year  Shingrix Recommend flu shot  Most recent labs done in May, he also had a recheck uric acid in September, CBC earlier this month Due for A1c, thyroid, PSA, lipid Can offer coronary calcium score   Allopurinol Eliquis 5 twice daily Aspirin 81 Carvedilol Lasix 40 mg twice daily Levothyroxine 50 Losartan/HCTZ Potassium 20 mill equivalents daily Patient Active Problem List   Diagnosis Date Noted   Acute gout of right knee 07/04/2022   Prediabetes 02/14/2020   Acute deep vein thrombosis (DVT) of left lower extremity (HCC) 02/05/2016   Acute pulmonary embolism (HCC) 02/04/2016   Lung nodule 02/04/2016   Obesity 02/04/2016   Idiopathic hypothyroidism 02/09/2015   Edema of both legs 02/09/2015   Sleep apnea 03/21/2013   Hypertension, benign 03/21/2013    Past Medical History:  Diagnosis Date   Deep vein thrombosis (DVT) of left lower extremity (HCC)    GERD (gastroesophageal reflux disease)    History of chicken pox    Hypertension    Measles    Pulmonary embolism (HCC)    Sleep apnea    Thyroid disease    Unfounded    Past Surgical History:  Procedure Laterality Date   COLONOSCOPY WITH PROPOFOL N/A 11/29/2016   Procedure: COLONOSCOPY WITH PROPOFOL;  Surgeon: Bernette Redbird, MD;  Location: [facility];  Service: Endoscopy;  Laterality: N/A;   FOOT SURGERY     Chain saw  accident    Social History   Tobacco Use   Smoking status: Never   Smokeless tobacco: Never  Vaping Use   Vaping Use: Never used  Substance Use Topics   Alcohol use: No    Alcohol/week: 0.0 standard drinks of alcohol   Drug use: No    Family History  Problem Relation Age of Onset   Hypertension Father        Living   Heart disease Mother        Living   Pulmonary embolism Mother    Anemia Mother    Hypertension Other        Paternal Side   Hypertension Sister        x2   Migraines Sister    Allergies Son        x1    No Known Allergies  Medication list has been reviewed and updated.  Current Outpatient Medications on File Prior to Visit  Medication Sig Dispense Refill   allopurinol (ZYLOPRIM) 100 MG tablet Take 1 tablet (100 mg total) by mouth daily. 30 tablet 2   apixaban (ELIQUIS) 5 MG TABS tablet Take 5 mg by mouth 2 (two) times daily.     aspirin EC 81 MG tablet Take 81 mg by mouth daily. Takes two 81 mg tablets in morning.     carvedilol (COREG) 12.5 MG tablet Take 1 tablet (12.5 mg total) by mouth daily. 90 tablet  1   furosemide (LASIX) 40 MG tablet TAKE 1 TABLET BY MOUTH 2 TIMES A DAY 180 tablet 1   levofloxacin (LEVAQUIN) 500 MG tablet Take 1 tablet (500 mg total) by mouth daily for 7 days. 7 tablet 0   levothyroxine (SYNTHROID) 50 MCG tablet Take 1 tablet (50 mcg total) by mouth daily before breakfast. 90 tablet 1   losartan-hydrochlorothiazide (HYZAAR) 100-12.5 MG tablet Take 1 tablet by mouth daily. 90 tablet 1   meloxicam (MOBIC) 7.5 MG tablet Take 1 tablet (7.5 mg total) by mouth daily. 14 tablet 0   potassium chloride SA (KLOR-CON M) 20 MEQ tablet TAKE 1 TABLET BY MOUTH EVERY DAY 90 tablet 1   predniSONE (DELTASONE) 20 MG tablet Take 40 mg by mouth daily until gout symptoms improve (2-3 days) and then take 20 mg daily for 5 days more (Patient not taking: Reported on 08/24/2022) 11 tablet 0   No current facility-administered medications on file prior to  visit.    Review of Systems:  As per HPI- otherwise negative.   Physical Examination: There were no vitals filed for this visit. There were no vitals filed for this visit. There is no height or weight on file to calculate BMI. Ideal Body Weight:    GEN: no acute distress. HEENT: Atraumatic, Normocephalic.  Ears and Nose: No external deformity. CV: RRR, No M/G/R. No JVD. No thrill. No extra heart sounds. PULM: CTA B, no wheezes, crackles, rhonchi. No retractions. No resp. distress. No accessory muscle use. ABD: S, NT, ND, +BS. No rebound. No HSM. EXTR: No c/c/e PSYCH: Normally interactive. Conversant.    Assessment and Plan: ***  Signed Abbe Amsterdam, MD

## 2022-08-26 NOTE — Patient Instructions (Incomplete)
I will be in touch with your labs Recommend flu shot, covid series, RSV and shingles vaccines

## 2022-08-29 ENCOUNTER — Ambulatory Visit: Payer: BC Managed Care – PPO | Admitting: Family Medicine

## 2022-08-29 ENCOUNTER — Other Ambulatory Visit: Payer: Self-pay | Admitting: Family Medicine

## 2022-08-29 ENCOUNTER — Other Ambulatory Visit: Payer: Self-pay | Admitting: Family

## 2022-08-29 VITALS — BP 134/80 | HR 68 | Temp 97.7°F | Resp 18 | Ht 73.0 in | Wt 357.2 lb

## 2022-08-29 DIAGNOSIS — I10 Essential (primary) hypertension: Secondary | ICD-10-CM

## 2022-08-29 DIAGNOSIS — E039 Hypothyroidism, unspecified: Secondary | ICD-10-CM

## 2022-08-29 DIAGNOSIS — Z125 Encounter for screening for malignant neoplasm of prostate: Secondary | ICD-10-CM

## 2022-08-29 DIAGNOSIS — L03116 Cellulitis of left lower limb: Secondary | ICD-10-CM

## 2022-08-29 DIAGNOSIS — R7989 Other specified abnormal findings of blood chemistry: Secondary | ICD-10-CM

## 2022-08-29 DIAGNOSIS — R7303 Prediabetes: Secondary | ICD-10-CM | POA: Diagnosis not present

## 2022-08-29 DIAGNOSIS — M10079 Idiopathic gout, unspecified ankle and foot: Secondary | ICD-10-CM | POA: Diagnosis not present

## 2022-08-29 DIAGNOSIS — I82492 Acute embolism and thrombosis of other specified deep vein of left lower extremity: Secondary | ICD-10-CM

## 2022-08-29 MED ORDER — LEVOFLOXACIN 500 MG PO TABS: 500.0000 mg | ORAL_TABLET | Freq: Every day | ORAL | 0 refills | Status: AC

## 2022-08-29 MED ORDER — APIXABAN 5 MG PO TABS: 5.0000 mg | ORAL_TABLET | Freq: Two times a day (BID) | ORAL | 2 refills | Status: DC

## 2022-08-30 LAB — LIPID PANEL
Cholesterol: 155 mg/dL (ref 0–200)
HDL: 41.1 mg/dL (ref >39.00)
LDL Cholesterol: 88 mg/dL (ref 0–99)
NonHDL: 113.43
Total CHOL/HDL Ratio: 4
Triglycerides: 127 mg/dL (ref 0.0–149.0)
VLDL: 25.4 mg/dL (ref 0.0–40.0)

## 2022-08-30 LAB — COMPREHENSIVE METABOLIC PANEL
ALT: 17 U/L (ref 0–53)
AST: 16 U/L (ref 0–37)
Albumin: 4.1 g/dL (ref 3.5–5.2)
Alkaline Phosphatase: 75 U/L (ref 39–117)
BUN: 27 mg/dL — ABNORMAL HIGH (ref 6–23)
CO2: 31 mEq/L (ref 19–32)
Calcium: 8.9 mg/dL (ref 8.4–10.5)
Chloride: 101 mEq/L (ref 96–112)
Creatinine, Ser: 1.33 mg/dL (ref 0.40–1.50)
GFR: 57.91 mL/min — ABNORMAL LOW (ref >60.00)
Glucose, Bld: 115 mg/dL — ABNORMAL HIGH (ref 70–99)
Potassium: 3.9 mEq/L (ref 3.5–5.1)
Sodium: 139 mEq/L (ref 135–145)
Total Bilirubin: 0.7 mg/dL (ref 0.2–1.2)
Total Protein: 6.7 g/dL (ref 6.0–8.3)

## 2022-08-30 LAB — TSH: TSH: 2.48 u[IU]/mL (ref 0.35–5.50)

## 2022-08-30 LAB — PSA: PSA: 0.13 ng/mL (ref 0.10–4.00)

## 2022-08-30 LAB — URIC ACID: Uric Acid, Serum: 8.1 mg/dL — ABNORMAL HIGH (ref 4.0–7.8)

## 2022-08-30 LAB — HEMOGLOBIN A1C: Hgb A1c MFr Bld: 6.5 % (ref 4.6–6.5)

## 2022-08-30 MED ORDER — ALLOPURINOL 100 MG PO TABS: 200.0000 mg | ORAL_TABLET | Freq: Every day | ORAL | 3 refills | Status: DC

## 2022-08-30 NOTE — Addendum Note (Signed)
Addended by: Abbe Amsterdam C on: 08/30/2022 07:10 PM   Modules accepted: Orders

## 2022-09-13 ENCOUNTER — Encounter: Payer: Self-pay | Admitting: Hematology & Oncology

## 2022-09-13 ENCOUNTER — Other Ambulatory Visit: Payer: Self-pay

## 2022-09-13 ENCOUNTER — Inpatient Hospital Stay: Payer: BC Managed Care – PPO | Admitting: Hematology & Oncology

## 2022-09-13 ENCOUNTER — Inpatient Hospital Stay: Payer: BC Managed Care – PPO | Attending: Hematology & Oncology

## 2022-09-13 VITALS — BP 134/67 | HR 50 | Temp 97.9°F | Resp 18 | Ht 73.0 in | Wt 361.0 lb

## 2022-09-13 DIAGNOSIS — Z86711 Personal history of pulmonary embolism: Secondary | ICD-10-CM | POA: Insufficient documentation

## 2022-09-13 DIAGNOSIS — Z7901 Long term (current) use of anticoagulants: Secondary | ICD-10-CM | POA: Diagnosis not present

## 2022-09-13 DIAGNOSIS — E669 Obesity, unspecified: Secondary | ICD-10-CM | POA: Insufficient documentation

## 2022-09-13 DIAGNOSIS — I2690 Septic pulmonary embolism without acute cor pulmonale: Secondary | ICD-10-CM

## 2022-09-13 DIAGNOSIS — I82462 Acute embolism and thrombosis of left calf muscular vein: Secondary | ICD-10-CM | POA: Diagnosis not present

## 2022-09-13 DIAGNOSIS — I82402 Acute embolism and thrombosis of unspecified deep veins of left lower extremity: Secondary | ICD-10-CM

## 2022-09-13 LAB — CBC WITH DIFFERENTIAL (CANCER CENTER ONLY)
Abs Immature Granulocytes: 0.01 10*3/uL (ref 0.00–0.07)
Basophils Absolute: 0 10*3/uL (ref 0.0–0.1)
Basophils Relative: 1 %
Eosinophils Absolute: 0.3 10*3/uL (ref 0.0–0.5)
Eosinophils Relative: 6 %
HCT: 40.4 % (ref 39.0–52.0)
Hemoglobin: 13.6 g/dL (ref 13.0–17.0)
Immature Granulocytes: 0 %
Lymphocytes Relative: 31 %
Lymphs Abs: 1.8 10*3/uL (ref 0.7–4.0)
MCH: 31.7 pg (ref 26.0–34.0)
MCHC: 33.7 g/dL (ref 30.0–36.0)
MCV: 94.2 fL (ref 80.0–100.0)
Monocytes Absolute: 0.5 10*3/uL (ref 0.1–1.0)
Monocytes Relative: 8 %
Neutro Abs: 3.1 10*3/uL (ref 1.7–7.7)
Neutrophils Relative %: 54 %
Platelet Count: 278 10*3/uL (ref 150–400)
RBC: 4.29 MIL/uL (ref 4.22–5.81)
RDW: 11.8 % (ref 11.5–15.5)
WBC Count: 5.7 10*3/uL (ref 4.0–10.5)
nRBC: 0 % (ref 0.0–0.2)

## 2022-09-13 LAB — CMP (CANCER CENTER ONLY)
ALT: 25 U/L (ref 0–44)
AST: 21 U/L (ref 15–41)
Albumin: 4.2 g/dL (ref 3.5–5.0)
Alkaline Phosphatase: 69 U/L (ref 38–126)
Anion gap: 8 (ref 5–15)
BUN: 19 mg/dL (ref 8–23)
CO2: 30 mmol/L (ref 22–32)
Calcium: 9.5 mg/dL (ref 8.9–10.3)
Chloride: 101 mmol/L (ref 98–111)
Creatinine: 1.03 mg/dL (ref 0.61–1.24)
GFR, Estimated: >60 mL/min (ref >60)
Glucose, Bld: 116 mg/dL — ABNORMAL HIGH (ref 70–99)
Potassium: 4.2 mmol/L (ref 3.5–5.1)
Sodium: 139 mmol/L (ref 135–145)
Total Bilirubin: 0.5 mg/dL (ref 0.3–1.2)
Total Protein: 6.8 g/dL (ref 6.5–8.1)

## 2022-09-13 LAB — LACTATE DEHYDROGENASE: LDH: 151 U/L (ref 98–192)

## 2022-09-13 LAB — D-DIMER, QUANTITATIVE: D-Dimer, Quant: 0.71 ug/mL-FEU — ABNORMAL HIGH (ref 0.00–0.50)

## 2022-09-13 NOTE — Progress Notes (Signed)
Hematology and Oncology Follow Up Visit  [name] [id number] 09-03-1961 61 y.o. 09/13/2022   Principle Diagnosis:  Acute thrombus in left gastrocnemius vein --recurrent thromboembolic disease-past history of pulmonary embolus and left leg DVT --idiopathic  Current Therapy:   Eliquis 5 mg p.o. twice daily-lifelong     Interim History:  [name] is back for a long awaited visit.  We last saw him about 2 or 3 years ago.  At that time, we had him on 2 baby aspirin a day.  He was doing well with this.  Apparently, recently, he bumped his left leg.  He noted some swelling.  He had a little bit of drainage in the left lower leg.  Dr. Patsy Lager, be very thorough, went ahead and did a Doppler.  This, unfortunately showed a new acute thrombus in the left gastrocnemius vein.  He was subsequently placed back on the Eliquis.  He is now off aspirin.  Probably his biggest issue is being gout.  He is a problem with recurrent gout.  He had a gouty attack in his right knee recently.  He is on allopurinol.  His last uric acid was 8.1.  Otherwise, he is doing well.  He does not smoke.  He does not have diabetes.  He is quite obese.  He is still working.  He works for [employer].  He is incredibly busy.  He works overtime.  He has had no fever.  There is been no exposure to COVID.  He has had no recent travel.  Currently, I would say his performance status is probably ECOG 0.  Medications:  Current Outpatient Medications:    allopurinol (ZYLOPRIM) 100 MG tablet, Take 2 tablets (200 mg total) by mouth daily., Disp: 180 tablet, Rfl: 3   apixaban (ELIQUIS) 5 MG TABS tablet, Take 1 tablet (5 mg total) by mouth 2 (two) times daily., Disp: 60 tablet, Rfl: 2   carvedilol (COREG) 12.5 MG tablet, Take 1 tablet (12.5 mg total) by mouth daily., Disp: 90 tablet, Rfl: 1   furosemide (LASIX) 40 MG tablet, TAKE 1 TABLET BY MOUTH 2 TIMES A DAY, Disp: 180 tablet, Rfl: 1   levothyroxine (SYNTHROID)  50 MCG tablet, TAKE 1 TABLET BY MOUTH EVERY DAY BEFORE BREAKFAST, Disp: 90 tablet, Rfl: 1   losartan-hydrochlorothiazide (HYZAAR) 100-12.5 MG tablet, TAKE 1 TABLET BY MOUTH EVERY DAY, Disp: 90 tablet, Rfl: 1   meloxicam (MOBIC) 7.5 MG tablet, Take 1 tablet (7.5 mg total) by mouth daily., Disp: 14 tablet, Rfl: 0   potassium chloride SA (KLOR-CON M) 20 MEQ tablet, TAKE 1 TABLET BY MOUTH EVERY DAY, Disp: 90 tablet, Rfl: 1  Allergies: No Known Allergies  Past Medical History, Surgical history, Social history, and Family History were reviewed and updated.  Review of Systems: Review of Systems  Constitutional: Negative.   HENT:  Negative.    Eyes: Negative.   Respiratory: Negative.    Cardiovascular: Negative.   Gastrointestinal: Negative.   Endocrine: Negative.   Genitourinary: Negative.    Musculoskeletal:  Positive for arthralgias.  Skin: Negative.   Neurological: Negative.   Hematological: Negative.   Psychiatric/Behavioral: Negative.      Physical Exam:  height is 6\' 1"  (1.854 m) and weight is 361 lb (163.7 kg) (abnormal). His oral temperature is 97.9 F (36.6 C). His blood pressure is 134/67 and his pulse is 50 (abnormal). His respiration is 18 and oxygen saturation is 99%.   Wt Readings from Last 3 Encounters:  09/13/22 09/15/22)  361 lb (163.7 kg)  08/29/22 (!) 357 lb 3.2 oz (162 kg)  08/24/22 (!) 359 lb (162.8 kg)    Physical Exam Vitals reviewed.  HENT:     Head: Normocephalic and atraumatic.  Eyes:     Pupils: Pupils are equal, round, and reactive to light.  Cardiovascular:     Rate and Rhythm: Normal rate and regular rhythm.     Heart sounds: Normal heart sounds.  Pulmonary:     Effort: Pulmonary effort is normal.     Breath sounds: Normal breath sounds.  Abdominal:     General: Bowel sounds are normal.     Palpations: Abdomen is soft.  Musculoskeletal:        General: No tenderness or deformity. Normal range of motion.     Cervical back: Normal range of motion.      Comments: His extremities does show significant edema.  He has more swelling in the left leg and the right leg.  He does have pitting edema in the left leg.  I really cannot palpate a venous cord.  Lymphadenopathy:     Cervical: No cervical adenopathy.  Skin:    General: Skin is warm and dry.     Findings: No erythema or rash.  Neurological:     Mental Status: He is alert and oriented to person, place, and time.  Psychiatric:        Behavior: Behavior normal.        Thought Content: Thought content normal.        Judgment: Judgment normal.      Lab Results  Component Value Date   WBC 5.7 09/13/2022   HGB 13.6 09/13/2022   HCT 40.4 09/13/2022   MCV 94.2 09/13/2022   PLT 278 09/13/2022     Chemistry      Component Value Date/Time   NA 139 09/13/2022 1336   NA 143 05/31/2017 1502   NA 140 01/25/2017 0958   K 4.2 09/13/2022 1336   K 3.9 05/31/2017 1502   K 3.9 01/25/2017 0958   CL 101 09/13/2022 1336   CL 104 05/31/2017 1502   CO2 30 09/13/2022 1336   CO2 32 05/31/2017 1502   CO2 26 01/25/2017 0958   BUN 19 09/13/2022 1336   BUN 17 05/31/2017 1502   BUN 19.6 01/25/2017 0958   CREATININE 1.03 09/13/2022 1336   CREATININE 1.2 05/31/2017 1502   CREATININE 1.0 01/25/2017 0958      Component Value Date/Time   CALCIUM 9.5 09/13/2022 1336   CALCIUM 9.6 05/31/2017 1502   CALCIUM 9.0 01/25/2017 0958   ALKPHOS 69 09/13/2022 1336   ALKPHOS 91 (H) 05/31/2017 1502   ALKPHOS 85 01/25/2017 0958   AST 21 09/13/2022 1336   AST 19 01/25/2017 0958   ALT 25 09/13/2022 1336   ALT 34 05/31/2017 1502   ALT 24 01/25/2017 0958   BILITOT 0.5 09/13/2022 1336   BILITOT 0.82 01/25/2017 0958       Impression and Plan: [name] is a very nice 61 year old white male.  He recently had his 61st birthday.  He now has another blood clot.  This is in his left leg.  It is his left lower leg.  It is not appear to have moved up into the thigh.  Regardless, he is going to need lifelong  anticoagulation.  We will have him on Eliquis.  I will repeat a Doppler when I see him back in about 3 months to see how everything looks.  I was a suspect that he probably will have a chronic thrombus in that vein.  He is somewhat fun to talk to.  He is incredibly interesting.  I am glad that he is busy at work.  He does not mind working overtime.  Again, we will get him through Winter.  I will see him back in March 2024.   Volanda Napoleon, MD 11/30/20232:49 PM

## 2022-09-28 ENCOUNTER — Encounter: Payer: Self-pay | Admitting: Medical

## 2022-09-28 ENCOUNTER — Ambulatory Visit: Payer: BC Managed Care – PPO | Admitting: Medical

## 2022-09-28 VITALS — BP 129/61 | HR 72 | Temp 98.2°F | Resp 18 | Ht 73.0 in | Wt 359.8 lb

## 2022-09-28 DIAGNOSIS — L089 Local infection of the skin and subcutaneous tissue, unspecified: Secondary | ICD-10-CM

## 2022-09-28 NOTE — Progress Notes (Signed)
Subjective:    Patient ID: [name], male    DOB: 07/01/1961, 61 y.o.   MRN: [id number]  HPI  Pt in for evaluation.  Pt last saw his pcp    08-29-2022. " Cellulitis of his left lower extremity does seem to be improving.  He notes he will be leaving town for a few days this weekend, going on a fishing trip.  He will be sure to keep his leg clean and dry.  He is making good progress but I would like to extend his antibiotic regimen by few more days, prescribed 5 more days of Levaquin He is on therapeutic Eliquis, 5 mg twice daily.  We will touch base with his hematologist about duration of therapy Check uric acid, his allopurinol may need adjustment I have asked him to be absolutely sure and let us know if his condition should worsen at all"   I had seen pt on 08-21-2022.  On review- pt has been on doxycycline, rocephin IM and then Dr copland gave levofloxin.Marland Kitchen  He states he was dong ok but then calf started to itch and feels warm at night and tender. No fever, no chills or seats.   Pt also had dvt in early November. Now on Eliquis. Being followed by Dr Myna Hidalgo.       Review of Systems  Constitutional:  Negative for chills, fatigue and fever.  Respiratory:  Negative for cough, chest tightness, shortness of breath and wheezing.   Cardiovascular:  Negative for chest pain.  Gastrointestinal:  Negative for abdominal pain.  Musculoskeletal:        Left lower extremity infection and chronic wound.  Skin:        See hpi.  Hematological:  Does not bruise/bleed easily.    Past Medical History:  Diagnosis Date   Deep vein thrombosis (DVT) of left lower extremity (HCC)    GERD (gastroesophageal reflux disease)    History of chicken pox    Hypertension    Measles    Pulmonary embolism (HCC)    Sleep apnea    Thyroid disease    Unfounded     Social History   Socioeconomic History   Marital status: Married    Spouse name: Not on file   Number of children: Not on  file   Years of education: Not on file   Highest education level: Not on file  Occupational History   Not on file  Tobacco Use   Smoking status: Never   Smokeless tobacco: Never  Vaping Use   Vaping Use: Never used  Substance and Sexual Activity   Alcohol use: No    Alcohol/week: 0.0 standard drinks of alcohol   Drug use: No   Sexual activity: Never  Other Topics Concern   Not on file  Social History Narrative   Not on file   Social Determinants of Health   Financial Resource Strain: Not on file  Food Insecurity: Not on file  Transportation Needs: Not on file  Physical Activity: Not on file  Stress: Not on file  Social Connections: Not on file  Intimate Partner Violence: Not on file    Past Surgical History:  Procedure Laterality Date   COLONOSCOPY WITH PROPOFOL N/A 11/29/2016   Procedure: COLONOSCOPY WITH PROPOFOL;  Surgeon: Bernette Redbird, MD;  Location: [facility];  Service: Endoscopy;  Laterality: N/A;   FOOT SURGERY     Chain saw accident    Family History  Problem Relation Age of Onset  Hypertension Father        Living   Heart disease Mother        Living   Pulmonary embolism Mother    Anemia Mother    Hypertension Other        Paternal Side   Hypertension Sister        x2   Migraines Sister    Allergies Son        x1    No Known Allergies  Current Outpatient Medications on File Prior to Visit  Medication Sig Dispense Refill   allopurinol (ZYLOPRIM) 100 MG tablet Take 2 tablets (200 mg total) by mouth daily. 180 tablet 3   apixaban (ELIQUIS) 5 MG TABS tablet Take 1 tablet (5 mg total) by mouth 2 (two) times daily. 60 tablet 2   carvedilol (COREG) 12.5 MG tablet Take 1 tablet (12.5 mg total) by mouth daily. 90 tablet 1   furosemide (LASIX) 40 MG tablet TAKE 1 TABLET BY MOUTH 2 TIMES A DAY 180 tablet 1   levothyroxine (SYNTHROID) 50 MCG tablet TAKE 1 TABLET BY MOUTH EVERY DAY BEFORE BREAKFAST 90 tablet 1   losartan-hydrochlorothiazide (HYZAAR)  100-12.5 MG tablet TAKE 1 TABLET BY MOUTH EVERY DAY 90 tablet 1   meloxicam (MOBIC) 7.5 MG tablet Take 1 tablet (7.5 mg total) by mouth daily. (Patient not taking: Reported on 09/28/2022) 14 tablet 0   potassium chloride SA (KLOR-CON M) 20 MEQ tablet TAKE 1 TABLET BY MOUTH EVERY DAY 90 tablet 1   No current facility-administered medications on file prior to visit.    BP 129/61 (BP Location: Right Arm, Patient Position: Sitting, Cuff Size: Large)   Pulse 72   Temp 98.2 F (36.8 C) (Oral)   Resp 18   Ht 6\' 1"  (1.854 m)   Wt (!) 359 lb 12.8 oz (163.2 kg)   SpO2 97%   BMI 47.47 kg/m        Objective:   Physical Exam  General- No acute distress. Pleasant patient. Neck- Full range of motion, no jvd Lungs- Clear, even and unlabored. Heart- regular rate and rhythm. Neurologic- CNII- XII grossly intact.  Left lower ext-  2.5 cm shallow wound. Surrounded by 10 cm pinkish red skin. Medial calf warm to palpation.      Assessment & Plan:  Left lower ext skin infection/pretibial region. Degree of infection concerning for skin with non healing central wound.   Wound culture send out. Rx levofloxin 500 mg daily for 10 days.  Placed referral to wound care center.  Follow up in 7 days or sooner if needed.   , PA-C

## 2022-09-28 NOTE — Patient Instructions (Addendum)
Left lower ext skin infection/pretibial region. Degree of infection concerning for skin with non healing central wound.   Wound culture send out. Rx levofloxin 500 mg daily for 10 days.(Can get probiotics otc or eat probiotics)  Placed referral to wound care center.  If area on leg worsens or changes then be seen in the ED.  Follow up in 7 days or sooner if needed.

## 2022-10-01 ENCOUNTER — Telehealth: Payer: Self-pay | Admitting: Family Medicine

## 2022-10-01 MED ORDER — LEVOFLOXACIN 500 MG PO TABS: 500.0000 mg | ORAL_TABLET | Freq: Every day | ORAL | 0 refills | Status: AC

## 2022-10-01 NOTE — Telephone Encounter (Signed)
I sent in the prescription today. Not sure what happened on Friday. Apologized to pt. He states the area is about exact same. No worrisoning features. No fever, no chills or sweats. Advised to start levofloxin today. He does have appointment with wound care in 1 month. Follow up appointment with me this Friday.

## 2022-10-01 NOTE — Telephone Encounter (Signed)
Pt called stating that Ramon Dredge was going to send in an Rx for his wound that isn't healing correctly. Verified this on Pt's AVS for the visit that levofloxin 500 mg daily for 10 days was supposed to be sent in and it wasn't sent in per medication list. Advised pt that a high priority note would be sent back to look into this and get it straightened out for his. Pt acknowledged understanding.

## 2022-10-01 NOTE — Addendum Note (Signed)
Addended by: Gwenevere Abbot on: 10/01/2022 12:10 PM   Modules accepted: Orders

## 2022-10-02 LAB — WOUND CULTURE
MICRO NUMBER:: 14321514
SPECIMEN QUALITY:: ADEQUATE

## 2022-10-05 ENCOUNTER — Ambulatory Visit: Payer: BC Managed Care – PPO | Admitting: Medical

## 2022-10-05 VITALS — BP 140/77 | HR 86 | Temp 98.0°F | Resp 18 | Ht 73.0 in | Wt 364.0 lb

## 2022-10-05 DIAGNOSIS — L03116 Cellulitis of left lower limb: Secondary | ICD-10-CM | POA: Diagnosis not present

## 2022-10-05 NOTE — Progress Notes (Signed)
Subjective:    Patient ID: [name], male    DOB: [birth year], 61 y.o.   MRN: [id number]  HPI  Pt in for follow up.  Pt had left lower extremity wound that will not heal despite treatment. See last visit note. Wound culture came up positive for pseudomonas. Sensitive to levofloxin. Rx sent in. No fever, no chills or sweats. Appt with wound care on October 29, 2021.  No fever, no chills or sweats. No increasing pain or swelling.  Review of Systems  Constitutional:  Negative for chills, fatigue and fever.  Respiratory:  Negative for cough, chest tightness, shortness of breath and wheezing.   Cardiovascular:  Negative for chest pain and palpitations.  Gastrointestinal:  Negative for abdominal pain.  Musculoskeletal:  Negative for back pain.  Skin:        See hpi and physical exam.  Hematological:  Positive for adenopathy.    Past Medical History:  Diagnosis Date   Deep vein thrombosis (DVT) of left lower extremity (HCC)    GERD (gastroesophageal reflux disease)    History of chicken pox    Hypertension    Measles    Pulmonary embolism (HCC)    Sleep apnea    Thyroid disease    Unfounded     Social History   Socioeconomic History   Marital status: Married    Spouse name: Not on file   Number of children: Not on file   Years of education: Not on file   Highest education level: Not on file  Occupational History   Not on file  Tobacco Use   Smoking status: Never   Smokeless tobacco: Never  Vaping Use   Vaping Use: Never used  Substance and Sexual Activity   Alcohol use: No    Alcohol/week: 0.0 standard drinks of alcohol   Drug use: No   Sexual activity: Never  Other Topics Concern   Not on file  Social History Narrative   Not on file   Social Determinants of Health   Financial Resource Strain: Not on file  Food Insecurity: Not on file  Transportation Needs: Not on file  Physical Activity: Not on file  Stress: Not on file  Social Connections: Not  on file  Intimate Partner Violence: Not on file    Past Surgical History:  Procedure Laterality Date   COLONOSCOPY WITH PROPOFOL N/A 11/29/2016   Procedure: COLONOSCOPY WITH PROPOFOL;  Surgeon: Bernette Redbird, MD;  Location: [facility];  Service: Endoscopy;  Laterality: N/A;   FOOT SURGERY     Chain saw accident    Family History  Problem Relation Age of Onset   Hypertension Father        Living   Heart disease Mother        Living   Pulmonary embolism Mother    Anemia Mother    Hypertension Other        Paternal Side   Hypertension Sister        x2   Migraines Sister    Allergies Son        x1    No Known Allergies  Current Outpatient Medications on File Prior to Visit  Medication Sig Dispense Refill   allopurinol (ZYLOPRIM) 100 MG tablet Take 2 tablets (200 mg total) by mouth daily. 180 tablet 3   apixaban (ELIQUIS) 5 MG TABS tablet Take 1 tablet (5 mg total) by mouth 2 (two) times daily. 60 tablet 2   carvedilol (COREG) 12.5 MG tablet  Take 1 tablet (12.5 mg total) by mouth daily. 90 tablet 1   furosemide (LASIX) 40 MG tablet TAKE 1 TABLET BY MOUTH 2 TIMES A DAY 180 tablet 1   levofloxacin (LEVAQUIN) 500 MG tablet Take 1 tablet (500 mg total) by mouth daily for 7 days. 10 tablet 0   levothyroxine (SYNTHROID) 50 MCG tablet TAKE 1 TABLET BY MOUTH EVERY DAY BEFORE BREAKFAST 90 tablet 1   losartan-hydrochlorothiazide (HYZAAR) 100-12.5 MG tablet TAKE 1 TABLET BY MOUTH EVERY DAY 90 tablet 1   meloxicam (MOBIC) 7.5 MG tablet Take 1 tablet (7.5 mg total) by mouth daily. 14 tablet 0   potassium chloride SA (KLOR-CON M) 20 MEQ tablet TAKE 1 TABLET BY MOUTH EVERY DAY 90 tablet 1   No current facility-administered medications on file prior to visit.    BP (!) 140/77   Pulse 86   Temp 98 F (36.7 C)   Resp 18   Ht 6\' 1"  (1.854 m)   Wt (!) 364 lb (165.1 kg)   SpO2 99%   BMI 48.02 kg/m        Objective:   Physical Exam  General- No acute distress. Pleasant  patient. Left lower ext- 1.0 cm shallow wound. Surrounded by 5.5 cm  former pinkish red skin looks more normal color and dry. Medial calf  less warm to palpation.       Assessment & Plan:   Patient Instructions  Your left lower extremity wound and surrounding area does look better. Continue levofloxin and attend wound care appointment. If area worsens or changes let know.   Follow up on October 16, 2021 or sooner if needed.     October 18, 2021, PA-C

## 2022-10-05 NOTE — Patient Instructions (Addendum)
Your left lower extremity wound and surrounding area does look better. Continue levofloxin and attend wound care appointment. If area worsens or changes let us know.   Follow up on October 16, 2021 or sooner if needed.

## 2022-10-16 ENCOUNTER — Ambulatory Visit: Payer: BC Managed Care – PPO | Admitting: Medical

## 2022-10-16 VITALS — BP 106/60 | HR 70 | Temp 97.7°F | Resp 18 | Ht 73.0 in | Wt 360.8 lb

## 2022-10-16 DIAGNOSIS — J069 Acute upper respiratory infection, unspecified: Secondary | ICD-10-CM

## 2022-10-16 DIAGNOSIS — L03116 Cellulitis of left lower limb: Secondary | ICD-10-CM

## 2022-10-16 DIAGNOSIS — R0981 Nasal congestion: Secondary | ICD-10-CM | POA: Diagnosis not present

## 2022-10-16 DIAGNOSIS — R059 Cough, unspecified: Secondary | ICD-10-CM

## 2022-10-16 MED ORDER — LEVOFLOXACIN 500 MG PO TABS: 500.0000 mg | ORAL_TABLET | Freq: Every day | ORAL | 0 refills | Status: AC

## 2022-10-16 MED ORDER — FLUTICASONE PROPIONATE 50 MCG/ACT NA SUSP: 2.0000 | Freq: Every day | NASAL | 1 refills | Status: DC

## 2022-10-16 MED ORDER — BENZONATATE 100 MG PO CAPS: 100.0000 mg | ORAL_CAPSULE | Freq: Three times a day (TID) | ORAL | 0 refills | Status: DC | PRN

## 2022-10-16 NOTE — Progress Notes (Signed)
Subjective:    Patient ID: [name], male    DOB: 1961/03/13, 62 y.o.   MRN: [id number]  HPI  Pt in with mild nasal congestion today with dry cough intermittent since last week. No sinus pain. No wheezing. He does have runny nose.   He is here for follow up on left lower ext cellulits.  Pt just finished his levofloxin on Thursday. No fever, no chills or sweats.   He has appointment with wound care on 15 th.    Review of Systems  Constitutional:  Negative for chills, fatigue and fever.  Respiratory:  Negative for cough, chest tightness and wheezing.   Cardiovascular:  Negative for chest pain and palpitations.  Gastrointestinal:  Negative for abdominal pain and blood in stool.  Musculoskeletal:  Negative for back pain.  Neurological:  Negative for dizziness, seizures, syncope, weakness, light-headedness and headaches.  Hematological:  Negative for adenopathy. Does not bruise/bleed easily.  Psychiatric/Behavioral:  Negative for behavioral problems and confusion.     Past Medical History:  Diagnosis Date   Deep vein thrombosis (DVT) of left lower extremity (HCC)    GERD (gastroesophageal reflux disease)    History of chicken pox    Hypertension    Measles    Pulmonary embolism (HCC)    Sleep apnea    Thyroid disease    Unfounded     Social History   Socioeconomic History   Marital status: Married    Spouse name: Not on file   Number of children: Not on file   Years of education: Not on file   Highest education level: Not on file  Occupational History   Not on file  Tobacco Use   Smoking status: Never   Smokeless tobacco: Never  Vaping Use   Vaping Use: Never used  Substance and Sexual Activity   Alcohol use: No    Alcohol/week: 0.0 standard drinks of alcohol   Drug use: No   Sexual activity: Never  Other Topics Concern   Not on file  Social History Narrative   Not on file   Social Determinants of Health   Financial Resource Strain: Not on  file  Food Insecurity: Not on file  Transportation Needs: Not on file  Physical Activity: Not on file  Stress: Not on file  Social Connections: Not on file  Intimate Partner Violence: Not on file    Past Surgical History:  Procedure Laterality Date   COLONOSCOPY WITH PROPOFOL N/A 11/29/2016   Procedure: COLONOSCOPY WITH PROPOFOL;  Surgeon: Ronald Lobo, MD;  Location: [facility];  Service: Endoscopy;  Laterality: N/A;   FOOT SURGERY     Chain saw accident    Family History  Problem Relation Age of Onset   Hypertension Father        Living   Heart disease Mother        Living   Pulmonary embolism Mother    Anemia Mother    Hypertension Other        Paternal Side   Hypertension Sister        x2   Migraines Sister    Allergies Son        x1    No Known Allergies  Current Outpatient Medications on File Prior to Visit  Medication Sig Dispense Refill   allopurinol (ZYLOPRIM) 100 MG tablet Take 2 tablets (200 mg total) by mouth daily. 180 tablet 3   apixaban (ELIQUIS) 5 MG TABS tablet Take 1 tablet (5 mg total)  by mouth 2 (two) times daily. 60 tablet 2   carvedilol (COREG) 12.5 MG tablet Take 1 tablet (12.5 mg total) by mouth daily. 90 tablet 1   furosemide (LASIX) 40 MG tablet TAKE 1 TABLET BY MOUTH 2 TIMES A DAY 180 tablet 1   levothyroxine (SYNTHROID) 50 MCG tablet TAKE 1 TABLET BY MOUTH EVERY DAY BEFORE BREAKFAST 90 tablet 1   losartan-hydrochlorothiazide (HYZAAR) 100-12.5 MG tablet TAKE 1 TABLET BY MOUTH EVERY DAY 90 tablet 1   meloxicam (MOBIC) 7.5 MG tablet Take 1 tablet (7.5 mg total) by mouth daily. 14 tablet 0   potassium chloride SA (KLOR-CON M) 20 MEQ tablet TAKE 1 TABLET BY MOUTH EVERY DAY 90 tablet 1   No current facility-administered medications on file prior to visit.    BP 106/60   Pulse 70   Temp 97.7 F (36.5 C)   Resp 18   Ht 6\' 1"  (1.854 m)   Wt (!) 360 lb 12.8 oz (163.7 kg)   SpO2 97%   BMI 47.60 kg/m        Objective:   Physical  Exam  General- No acute distress. Pleasant patient. Neck- Full range of motion, no jvd Lungs- Clear, even and unlabored. Heart- regular rate and rhythm. Neurologic- CNII- XII grossly intact.  Heent- no sinus pressure.  Left lower ext- 1.0 cm shallow wound. Surrounded by 5.5 cm  former pinkish red skin looks more normal color and dry. Medial calf  less warm to palpation.         Assessment & Plan:   Patient Instructions  Samuel [place] early. Rx flonase nasal spray and benzonatate for cough. Can continue corcidin as well.  Covid test offered/declined. Not very suspicious presently. If symptoms worsen check with OTC test and let me know results. If test recommend test within 5 days.  For persisting left lower extremity celluliits refilled your levofloxin and elevate your leg when can/not working  Follow up with wound care in about 2 weeks or sooner if needed.   Mackie Pai PA-C

## 2022-10-16 NOTE — Patient Instructions (Addendum)
Uri early. Rx flonase nasal spray and benzonatate for cough. Can continue corcidin as well.  Covid test offered/declined. Not very suspicious presently. If symptoms worsen check with OTC test and let me know results. If test recommend test within 5 days.  For persisting left lower extremity cellulitis refilled your levofloxin and elevate your leg when can/not working  Follow up with wound care in about 2 weeks or sooner if needed.

## 2022-10-29 ENCOUNTER — Other Ambulatory Visit: Payer: Self-pay | Admitting: Family Medicine

## 2022-10-29 ENCOUNTER — Encounter (HOSPITAL_BASED_OUTPATIENT_CLINIC_OR_DEPARTMENT_OTHER): Payer: BC Managed Care – PPO | Attending: General Surgery | Admitting: General Surgery

## 2022-10-29 DIAGNOSIS — N189 Chronic kidney disease, unspecified: Secondary | ICD-10-CM | POA: Diagnosis not present

## 2022-10-29 DIAGNOSIS — I82492 Acute embolism and thrombosis of other specified deep vein of left lower extremity: Secondary | ICD-10-CM

## 2022-10-29 DIAGNOSIS — G4730 Sleep apnea, unspecified: Secondary | ICD-10-CM | POA: Insufficient documentation

## 2022-10-29 DIAGNOSIS — I129 Hypertensive chronic kidney disease with stage 1 through stage 4 chronic kidney disease, or unspecified chronic kidney disease: Secondary | ICD-10-CM | POA: Insufficient documentation

## 2022-10-29 DIAGNOSIS — Z86718 Personal history of other venous thrombosis and embolism: Secondary | ICD-10-CM | POA: Insufficient documentation

## 2022-10-29 DIAGNOSIS — L97822 Non-pressure chronic ulcer of other part of left lower leg with fat layer exposed: Secondary | ICD-10-CM | POA: Insufficient documentation

## 2022-10-29 NOTE — Progress Notes (Signed)
Timothy, MERION GRIMALDO ([id number]) [id number] Page 1 of 9 Visit Report for 10/29/2022 Allergy List Details Patient Name: Date of Service: MCDO JOMEL, WHITTLESEY 10/29/2022 1:15 PM Medical Record Number: [id number] Patient Account Number: [id number] Date of Birth/Sex: Treating RN: [birth year] (62 y.o. Waldron Session Primary Care Khalib Fendley: [name] Other Clinician: Referring Margarie Mcguirt: Treating Lillyanne Bradburn/Extender: [name] in Treatment: 0 Allergies Active Allergies No Known Allergies Allergy Notes Electronic Signature(s) Signed: 10/29/2022 12:12:22 PM By: Blanche East RN Entered By: Blanche East on 10/29/2022 12:12:21 -------------------------------------------------------------------------------- Arrival Information Details Patient Name: Date of Service: [place], Tamarcus R. 10/29/2022 1:15 PM Medical Record Number: [id number] Patient Account Number: [id number] Date of Birth/Sex: Treating RN: 05-17-1961 (62 y.o. Waldron Session Primary Care Anush Wiedeman: [name] Other Clinician: Referring Jobie Popp: Treating Sivan Cuello/Extender: [name] in Treatment: 0 Visit Information Patient Arrived: Ambulatory Arrival Time: 13:42 Accompanied By: wife Transfer Assistance: None Patient Identification Verified: Yes Secondary Verification Process Completed: Yes Patient Requires Transmission-Based Precautions: No Patient Has Alerts: Yes Patient Alerts: Patient on Blood Thinner Electronic Signature(s) Signed: 10/30/2022 3:04:08 PM By: Blanche East RN Entered By: Blanche East on 10/29/2022 13:42:17 -------------------------------------------------------------------------------- Clinic Level of Care Assessment Details Patient Name: Date of Service: MCDO KINTA, MARTIS. 10/29/2022 1:15 PM [place], [name] ([id number]) [id number] Page 2 of 9 Medical Record Number:  [id number] Patient Account Number: [id number] Date of Birth/Sex: Treating RN: 09-Jul-1961 (62 y.o. Waldron Session Primary Care Telia Amundson: [name] Other Clinician: Referring Lizbeth Feijoo: Treating Ieesha Abbasi/Extender: [name] in Treatment: 0 Clinic Level of Care Assessment Items TOOL 1 Quantity Score X- 1 0 Use when EandM and Procedure is performed on INITIAL visit ASSESSMENTS - Nursing Assessment / Reassessment X- 1 20 General Physical Exam (combine w/ comprehensive assessment (listed just below) when performed on new pt. evals) X- 1 25 Comprehensive Assessment (HX, ROS, Risk Assessments, Wounds Hx, etc.) ASSESSMENTS - Wound and Skin Assessment / Reassessment []  - 0 Dermatologic / Skin Assessment (not related to wound area) ASSESSMENTS - Ostomy and/or Continence Assessment and Care []  - 0 Incontinence Assessment and Management []  - 0 Ostomy Care Assessment and Management (repouching, etc.) PROCESS - Coordination of Care X - Simple Patient / Family Education for ongoing care 1 15 []  - 0 Complex (extensive) Patient / Family Education for ongoing care X- 1 10 Staff obtains Programmer, systems, Records, T Results / Process Orders est X- 1 10 Staff telephones HHA, Nursing Homes / Clarify orders / etc []  - 0 Routine Transfer to another Facility (non-emergent condition) []  - 0 Routine Hospital Admission (non-emergent condition) X- 1 15 New Admissions / Biomedical engineer / Ordering NPWT Apligraf, etc. , []  - 0 Emergency Hospital Admission (emergent condition) PROCESS - Special Needs []  - 0 Pediatric / Minor Patient Management []  - 0 Isolation Patient Management []  - 0 Hearing / Language / Visual special needs []  - 0 Assessment of Community assistance (transportation, D/C planning, etc.) []  - 0 Additional assistance / Altered mentation []  - 0 Support Surface(s) Assessment (bed, cushion, seat, etc.) INTERVENTIONS - Miscellaneous []  -  0 External ear exam []  - 0 Patient Transfer (multiple staff / Civil Service fast streamer / Similar devices) []  - 0 Simple Staple / Suture removal (25 or less) []  - 0 Complex Staple / Suture removal (26 or more) []  - 0 Hypo/Hyperglycemic Management (do not check if billed separately) X- 1 15 Ankle / Brachial Index (ABI) - do not check if billed separately Has the patient been seen  at the hospital within the last three years: Yes Total Score: 110 Level Of Care: New/Established - Level 3 Electronic Signature(s) Signed: 10/30/2022 3:04:08 PM By: Tommie Ard RN Entered By: Tommie Ard on 10/29/2022 14:56:06 Tolle, Lissa Merlin ([id number]) [id number] Page 3 of 9 -------------------------------------------------------------------------------- Compression Therapy Details Patient Name: Date of Service: MCDO SHANNAN, GARFINKEL 10/29/2022 1:15 PM Medical Record Number: [id number] Patient Account Number: [id number] Date of Birth/Sex: Treating RN: [birth year] (62 y.o. Valma Cava Primary Care Harshil Cavallaro: Abbe [place] Other Clinician: Referring Wynter Isaacs: Treating Kaileen Bronkema/Extender: San Jetty in Treatment: 0 Compression Therapy Performed for Wound Assessment: Wound #1 Left,Anterior Lower Leg Performed By: Clinician Tommie Ard, RN Compression Type: Three Layer Post Procedure Diagnosis Same as Pre-procedure Electronic Signature(s) Signed: 10/29/2022 2:58:48 PM By: Tommie Ard RN Entered By: Tommie Ard on 10/29/2022 14:58:48 -------------------------------------------------------------------------------- Encounter Discharge Information Details Patient Name: Date of Service: MCDO WELL, Jerric R. 10/29/2022 1:15 PM Medical Record Number: [id number] Patient Account Number: [id number] Date of Birth/Sex: Treating RN: Nov 01, 1960 (62 y.o. Valma Cava Primary Care Cheynne Virden: Abbe [place] Other Clinician: Referring Darneisha Windhorst: Treating  Tejah Brekke/Extender: San Jetty in Treatment: 0 Encounter Discharge Information Items Post Procedure Vitals Discharge Condition: Stable Temperature (F): 98.3 Ambulatory Status: Ambulatory Pulse (bpm): 62 Discharge Destination: Home Respiratory Rate (breaths/min): 18 Transportation: Private Auto Blood Pressure (mmHg): 134/84 Accompanied By: wife Schedule Follow-up Appointment: Yes Clinical Summary of Care: Electronic Signature(s) Signed: 10/29/2022 2:59:46 PM By: Tommie Ard RN Entered By: Tommie Ard on 10/29/2022 14:59:46 -------------------------------------------------------------------------------- Lower Extremity Assessment Details Patient Name: Date of Service: MCDO ECHO, ALLSBROOK 10/29/2022 1:15 PM Medical Record Number: [id number] Patient Account Number: [id number] Date of Birth/Sex: Treating RN: [birth year] (62 y.o. Valma Cava Primary Care Marchell Froman: Abbe [place] Other Clinician: Referring Drena Ham: Treating Jonda Alanis/Extender: San Jetty in Treatment: 0 Edema Assessment Left: [Left: Right] [Right: :] Assessed: [Left: No] [Right: No] [Left: Edema] [Right: :] Calf Left: Right: Point of Measurement: From Medial Instep 50.4 cm Ankle Left: Right: Point of Measurement: From Medial Instep 28 cm Vascular Assessment Pulses: Dorsalis Pedis Palpable: [Left:Yes] Doppler Audible: [Left:Yes] Posterior Tibial Palpable: [Left:No] Blood Pressure: Brachial: [Left:134] Ankle: [Left:Dorsalis Pedis: 170 1.27] Electronic Signature(s) Signed: 10/30/2022 3:04:08 PM By: Tommie Ard RN Entered By: Tommie Ard on 10/29/2022 14:00:39 -------------------------------------------------------------------------------- Multi Wound Chart Details Patient Name: Date of Service: MCDO WELL, Lequan R. 10/29/2022 1:15 PM Medical Record Number: [id number] Patient Account Number: [id number] Date of Birth/Sex: Treating  RN: 1961-05-14 (62 y.o. M) Primary Care Stillman Buenger: Abbe [place] Other Clinician: Referring Gera Inboden: Treating Kenan Moodie/Extender: San Jetty in Treatment: 0 [1:Photos:] [N/A:N/A] Left, Anterior Lower Leg N/A N/A Wound Location: Trauma N/A N/A Wounding Event: Venous Leg Ulcer N/A N/A Primary Etiology: Sleep Apnea, Hypertension, Gout N/A N/A Comorbid History: 07/09/2022 N/A N/A Date Acquired: 0 N/A N/A Weeks of Treatment: Open N/A N/A Wound Status: No N/A N/A Wound Recurrence: 0.6x0.9x0.1 N/A N/A Measurements L x W x D (cm) 0.424 N/A N/A A (cm) : rea 0.042 N/A N/A Volume (cm) : Full Thickness Without Exposed N/A N/A Classification: Support Structures Medium N/A N/A Exudate Amount: Serous N/A N/A Exudate Type: amber N/A N/A Exudate Color: Medium (34-66%) N/A N/A Granulation Amount: Pink N/A N/A Granulation Quality: Scarfo, Casy R ([id number]) [id number] Page 5 of 9 Medium (34-66%) N/A N/A Necrotic Amount: Fat Layer (Subcutaneous Tissue): Yes N/A N/A Exposed Structures: Fascia: No Tendon: No Muscle: No Joint: No Bone: No Small (1-33%) N/A N/A Epithelialization: Rash: Yes N/A N/A Periwound  Skin Texture: Dry/Scaly: Yes N/A N/A Periwound Skin Moisture: Maceration: No Erythema: Yes N/A N/A Periwound Skin Color: No Abnormality N/A N/A Temperature: Yes N/A N/A Tenderness on Palpation: Treatment Notes Electronic Signature(s) Signed: 10/29/2022 2:30:54 PM By: Duanne Guess MD FACS Entered By: Duanne Guess on 10/29/2022 14:30:54 -------------------------------------------------------------------------------- Multi-Disciplinary Care Plan Details Patient Name: Date of Service: MCDO WELL, Shyhiem R. 10/29/2022 1:15 PM Medical Record Number: [id number] Patient Account Number: [id number] Date of Birth/Sex: Treating RN: 12-27-1960 (61 y.o. Valma Cava Primary Care Takyla Kuchera: Abbe [place]  Other Clinician: Referring Marlies Ligman: Treating Celicia Minahan/Extender: San Jetty in Treatment: 0 Active Inactive Nutrition Nursing Diagnoses: Imbalanced nutrition Goals: Patient/caregiver agrees to and verbalizes understanding of need to use nutritional supplements and/or vitamins as prescribed Date Initiated: 10/29/2022 Target Resolution Date: 12/13/2022 Goal Status: Active Interventions: Provide education on nutrition Treatment Activities: Dietary management education, guidance and counseling : 10/29/2022 Giving encouragement to exercise : 10/29/2022 Notes: Orientation to the Wound Care Program Nursing Diagnoses: Knowledge deficit related to the wound healing center program Goals: Patient/caregiver will verbalize understanding of the Wound Healing Center Program Date Initiated: 10/29/2022 Target Resolution Date: 01/17/2023 Goal Status: Active Interventions: Provide education on orientation to the wound center Notes: Wound/Skin Impairment Vaughan, Mathis R ([id number]) [id number] Page 6 of 9 Nursing Diagnoses: Impaired tissue integrity Goals: Ulcer/skin breakdown will have a volume reduction of 30% by week 4 Date Initiated: 10/29/2022 Target Resolution Date: 12/10/2022 Goal Status: Active Interventions: Assess ulceration(s) every visit Provide education on ulcer and skin care Treatment Activities: Skin care regimen initiated : 10/29/2022 Topical wound management initiated : 10/29/2022 Notes: Electronic Signature(s) Signed: 10/29/2022 2:55:04 PM By: Tommie Ard RN Entered By: Tommie Ard on 10/29/2022 14:55:04 -------------------------------------------------------------------------------- Pain Assessment Details Patient Name: Date of Service: MCDO MARLO, GOODRICH 10/29/2022 1:15 PM Medical Record Number: [id number] Patient Account Number: [id number] Date of Birth/Sex: Treating RN: 16-Jan-1961 (62 y.o. Valma Cava Primary Care Loyce Klasen: Abbe [place] Other Clinician: Referring Francely Craw: Treating Nashika Coker/Extender: San Jetty in Treatment: 0 Active Problems Location of Pain Severity and Description of Pain Patient Has Paino No Site Locations Rate the pain. Current Pain Level: 0 Pain Management and Medication Current Pain Management: Electronic Signature(s) Signed: 10/30/2022 3:04:08 PM By: Tommie Ard RN Entered By: Tommie Ard on 10/29/2022 14:11:04 Cerveny, Lissa Merlin ([id number]) [id number] Page 7 of 9 -------------------------------------------------------------------------------- Patient/Caregiver Education Details Patient Name: Date of Service: MCDO FULTON, MERRY 1/15/2024andnbsp1:15 PM Medical Record Number: [id number] Patient Account Number: [id number] Date of Birth/Gender: Treating RN: Jan 11, 1961 (62 y.o. Valma Cava Primary Care Physician: Abbe [place] Other Clinician: Referring Physician: Treating Physician/Extender: San Jetty in Treatment: 0 Education Assessment Education Provided To: Patient Education Topics Provided Welcome T [facility]New Patient Packet: o Methods: Explain/Verbal Responses: Reinforcements needed, State content correctly Wound Debridement: Methods: Explain/Verbal Responses: Reinforcements needed, State content correctly Wound/Skin Impairment: Methods: Explain/Verbal Responses: Reinforcements needed, State content correctly Electronic Signature(s) Signed: 10/30/2022 3:04:08 PM By: Tommie Ard RN Entered By: Tommie Ard on 10/29/2022 14:55:22 -------------------------------------------------------------------------------- Wound Assessment Details Patient Name: Date of Service: MCDO VENCE, LALOR 10/29/2022 1:15 PM Medical Record Number: [id number] Patient Account Number: [id number] Date of Birth/Sex: Treating RN: 1961/01/08 (62  y.o. Valma Cava Primary Care Briannon Boggio: Abbe [place] Other Clinician: Referring Cristina Ceniceros: Treating Banessa Mao/Extender: San Jetty in Treatment: 0 Wound Status Wound Number: 1 Primary Etiology: Venous Leg Ulcer Wound Location: Left, Anterior Lower Leg Wound Status: Open Wounding Event: Trauma Comorbid History: Sleep Apnea,  Hypertension, Gout Date Acquired: 07/09/2022 Weeks Of Treatment: 0 Clustered Wound: No Photos Woo, DANDRE SISLER ([id number]) [id number] Page 8 of 9 Wound Measurements Length: (cm) 0.6 Width: (cm) 0.9 Depth: (cm) 0.1 Area: (cm) 0.424 Volume: (cm) 0.042 % Reduction in Area: % Reduction in Volume: Epithelialization: Small (1-33%) Tunneling: No Undermining: No Wound Description Classification: Full Thickness Without Exposed Support Structures Exudate Amount: Medium Exudate Type: Serous Exudate Color: amber Foul Odor After Cleansing: No Slough/Fibrino Yes Wound Bed Granulation Amount: Medium (34-66%) Exposed Structure Granulation Quality: Pink Fascia Exposed: No Necrotic Amount: Medium (34-66%) Fat Layer (Subcutaneous Tissue) Exposed: Yes Necrotic Quality: Adherent Slough Tendon Exposed: No Muscle Exposed: No Joint Exposed: No Bone Exposed: No Periwound Skin Texture Texture Color No Abnormalities Noted: No No Abnormalities Noted: No Rash: Yes Erythema: Yes Moisture Temperature / Pain No Abnormalities Noted: No Temperature: No Abnormality Dry / Scaly: Yes Tenderness on Palpation: Yes Maceration: No Treatment Notes Wound #1 (Lower Leg) Wound Laterality: Left, Anterior Cleanser Soap and Water Discharge Instruction: May shower and wash wound with dial antibacterial soap and water prior to dressing change. Wound Cleanser Discharge Instruction: Cleanse the wound with wound cleanser prior to applying a clean dressing using gauze sponges, not tissue or cotton balls. Peri-Wound  Care Sween Lotion (Moisturizing lotion) Discharge Instruction: Apply moisturizing lotion as directed Topical Gentamicin Discharge Instruction: As directed by physician Primary Dressing Sorbalgon AG Dressing 2x2 (in/in) Discharge Instruction: Apply to wound bed as instructed Secondary Dressing Secured With Conforming Stretch Gauze Bandage, Sterile 2x75 (in/in) Discharge Instruction: Secure with stretch gauze as directed. Transpore Surgical Tape, 2x10 (in/yd) Discharge Instruction: Secure dressing with tape as directed. Lanigan, LASHAN GLUTH ([id number]) [id number] Page 9 of 9 Compression Wrap Compression Stockings Add-Ons Electronic Signature(s) Signed: 10/30/2022 3:04:08 PM By: Blanche East RN Entered By: Blanche East on 10/29/2022 14:08:00 -------------------------------------------------------------------------------- Vitals Details Patient Name: Date of Service: MCDO WELL, Morgon R. 10/29/2022 1:15 PM Medical Record Number: [id number] Patient Account Number: [id number] Date of Birth/Sex: Treating RN: 1960/11/15 (62 y.o. Waldron Session Primary Care Elfie Costanza: [name] Other Clinician: Referring Amato Sevillano: Treating Jazmen Lindenbaum/Extender: [name] in Treatment: 0 Vital Signs Time Taken: 14:00 Temperature (F): 98.3 Height (in): 73 Pulse (bpm): 62 Source: Stated Respiratory Rate (breaths/min): 18 Weight (lbs): 365 Blood Pressure (mmHg): 134/84 Source: Stated Reference Range: 80 - 120 mg / dl Body Mass Index (BMI): 48.2 Electronic Signature(s) Signed: 10/29/2022 2:53:20 PM By: Blanche East RN Entered By: Blanche East on 10/29/2022 14:53:20

## 2022-10-30 NOTE — Progress Notes (Signed)
[name], [name] CURREY ([id number]) [id number] Page 1 of 10 Visit Report for 10/29/2022 Chief Complaint Document Details Patient Name: Date of Service: [name] [name], [name] 10/29/2022 1:15 PM Medical Record Number: [id number] Patient Account Number: [id number] Date of Birth/Sex: Treating RN: Mar 21, 1961 (62 y.o. M) Primary Care Provider: Abbe Amsterdam Other Clinician: Referring Provider: Treating Provider/Extender: [name] Jetty in Treatment: 0 Information Obtained from: Patient Chief Complaint Patient seen for complaints of Non-Healing Wound. Electronic Signature(s) Signed: 10/29/2022 1:42:36 PM By: Duanne Guess MD FACS Entered By: Duanne Guess on 10/29/2022 13:42:36 -------------------------------------------------------------------------------- Debridement Details Patient Name: Date of Service: [name] [name], [name] [name]. 10/29/2022 1:15 PM Medical Record Number: [id number] Patient Account Number: [id number] Date of Birth/Sex: Treating RN: Feb 21, 1961 (62 y.o. Valma Cava Primary Care Provider: Abbe Amsterdam Other Clinician: Referring Provider: Treating Provider/Extender: [name] Jetty in Treatment: 0 Debridement Performed for Assessment: Wound #1 Left,Anterior Lower Leg Performed By: Physician Duanne Guess, MD Debridement Type: Debridement Severity of Tissue Pre Debridement: Fat layer exposed Level of Consciousness (Pre-procedure): Awake and Alert Pre-procedure Verification/Time Out Yes - 14:19 Taken: Start Time: 14:20 Pain Control: Lidocaine 4% T opical Solution T Area Debrided (L x W): otal 0.6 (cm) x 0.9 (cm) = 0.54 (cm) Tissue and other material debrided: Non-Viable, Eschar, Slough, Slough Level: Non-Viable Tissue Debridement Description: Selective/Open Wound Instrument: Curette Bleeding: Minimum Hemostasis Achieved: Pressure Response to Treatment: Procedure was tolerated  [name] Level of Consciousness (Post- Awake and Alert procedure): Post Debridement Measurements of Total Wound Length: (cm) 0.6 Width: (cm) 0.9 Depth: (cm) 0.1 Volume: (cm) 0.042 Character of Wound/Ulcer Post Debridement: Requires Further Debridement Severity of Tissue Post Debridement: Fat layer exposed Post Procedure Diagnosis Same as Pre-procedure [name] [name] ([id number]) [id number] Page 2 of 10 Notes Scribed for Dr. Lady Gary by Tommie Ard, RN Electronic Signature(s) Signed: 10/29/2022 2:58:36 PM By: Tommie Ard RN Signed: 10/29/2022 3:23:09 PM By: Duanne Guess MD FACS Entered By: Tommie Ard on 10/29/2022 14:58:36 -------------------------------------------------------------------------------- HPI Details Patient Name: Date of Service: [name] [name], [name] [name]. 10/29/2022 1:15 PM Medical Record Number: [id number] Patient Account Number: [id number] Date of Birth/Sex: Treating RN: 29-May-1961 (62 y.o. M) Primary Care Provider: Abbe Amsterdam Other Clinician: Referring Provider: Treating Provider/Extender: [name] Jetty in Treatment: 0 History of Present Illness HPI Description: ADMISSION 10/29/2022 This is a 62 year old man with a past medical history significant for obesity, history of DVT borderline diabetes, and gout. He apparently bumped his left mid , pretibial area on a plow in October 2023. he developed a wound in this area that simply has not healed. When he saw his P CP in November, a DVT scan was done that showed an acute thrombus in the gastrocnemius vein in the calf with chronic thrombus in the distal femoral and popliteal veins. He does have a history of prior DVT in this leg. A culture was taken that grew out Pseudomonas and he was prescribed levofloxacin, which has been continued since that time. The leg has been cultured multiple times with persistent isolation of Pseudomonas, heavy growth. The culture  reports do indicate sensitivity to levofloxacin. Most recent hemoglobin A1c was 6.5. ABI in clinic today was 1.27. On the anterior tibial surface of the left leg, there is a small circular ulcer with thick yellow slough on the surface. The patient has 2+ pitting edema and discoloration consistent with stasis dermatitis. Electronic Signature(s) Signed: 10/29/2022 2:32:20 PM By: Duanne Guess MD FACS Previous Signature: 10/29/2022 1:48:52 PM Version By: Duanne Guess MD FACS  Entered By: Duanne Guessannon, Dene Landsberg on 10/29/2022 14:32:20 -------------------------------------------------------------------------------- Physical Exam Details Patient Name: Date of Service: [name] [name] [name], [name] [name]. 10/29/2022 1:15 PM Medical Record Number: [id number] Patient Account Number: [id number] Date of Birth/Sex: Treating RN: [birth year] (62 y.o. M) Primary Care Provider: Abbe Amsterdamop[place], [name] Other Clinician: Referring Provider: Treating Provider/Extender: [name] [name], Rachit Grim [name] [name] Weeks in Treatment: 0 Constitutional No acute distress. Respiratory Normal work of breathing on room air. Cardiovascular 2+ pitting edema to at least the knee on the left; right leg not inspected. Notes 10/29/2022: On the anterior tibial surface of the left leg, there is a small circular ulcer with thick yellow slough on the surface. The patient has 2+ pitting edema and discoloration consistent with stasis dermatitis. [name] [name] ([id number]) [id number] Page 3 of 10 Electronic Signature(s) Signed: 10/29/2022 2:32:50 PM By: Duanne Guessannon, Asna Muldrow MD FACS Entered By: Duanne Guessannon, Tomika Eckles on 10/29/2022 14:32:50 -------------------------------------------------------------------------------- Physician Orders Details Patient Name: Date of Service: [name] [name], [name] [name]. 10/29/2022 1:15 PM Medical Record Number: [id number] Patient Account Number: [id number] Date of Birth/Sex: Treating RN: [birth year] (62 y.o. Valma CavaM)  [name] [name] Primary Care Provider: Abbe Amsterdam[place], [name] Other Clinician: Referring Provider: Treating Provider/Extender: [name] [name], Timonthy Hovater [name] [name] Weeks in Treatment: 0 Verbal / Phone Orders: No Diagnosis Coding ICD-10 Coding Code Description [phone] Non-pressure chronic ulcer of other part of left lower leg with fat layer exposed I10 Essential (primary) hypertension R60.0 Localized edema E66.01 Morbid (severe) obesity due to excess calories Follow-up Appointments ppointment in 1 week. - Dr. Lady Garyannon Rm 4 Return A Anesthetic Wound #1 Left,Anterior Lower Leg (In clinic) Topical Lidocaine 5% applied to wound bed - Prior to debridement Bathing/ Shower/ Hygiene May shower with protection but do not get wound dressing(s) wet. Protect dressing(s) with water repellant cover (for example, large plastic bag) or a cast cover and may then take shower. - May purchase cast protector from [facility] or [facility]. The dressing needs to stay dry at all times. If the dressing gets wet, please reach out to us and we will make an appt to have the dressing changed. Edema Control - Lymphedema / SCD / Other Left Lower Extremity Elevate legs to the level of the heart or above for 30 minutes daily and/or when sitting for 3-4 times a day throughout the day. Avoid standing for long periods of time. Exercise regularly Moisturize legs daily. Wound Treatment Wound #1 - Lower Leg Wound Laterality: Left, Anterior Cleanser: Soap and Water 1 x Per Week Discharge Instructions: May shower and wash wound with dial antibacterial soap and water prior to dressing change. Cleanser: Wound Cleanser 1 x Per Week Discharge Instructions: Cleanse the wound with wound cleanser prior to applying a clean dressing using gauze sponges, not tissue or cotton balls. Peri-Wound Care: Sween Lotion (Moisturizing lotion) 1 x Per Week Discharge Instructions: Apply moisturizing lotion as directed Topical: Gentamicin 1 x Per Week Discharge  Instructions: As directed by physician Prim Dressing: Sorbalgon AG Dressing 2x2 (in/in) 1 x Per Week ary Discharge Instructions: Apply to wound bed as instructed Secured With: Conforming Stretch Gauze Bandage, Sterile 2x75 (in/in) 1 x Per Week Discharge Instructions: Secure with stretch gauze as directed. Secured With: Transpore Surgical Tape, 2x10 (in/yd) 1 x Per Week Discharge Instructions: Secure dressing with tape as directed. Perezperez, Lissa MerlinERRY [name] ([id number]) [id number] Page 4 of 10 Electronic Signature(s) Signed: 10/29/2022 3:23:09 PM By: Duanne Guessannon, Carmelita Amparo MD FACS Entered By: Duanne Guessannon, Araeya Lamb on 10/29/2022 14:33:02 -------------------------------------------------------------------------------- Problem List Details Patient Name: Date of Service: [name] [name], [place] [name]. 10/29/2022 1:15 PM Medical Record Number:  [id number] Patient Account Number: [id number] Date of Birth/Sex: Treating RN: 17-Jun-1961 (62 y.o. M) Primary Care Provider: [name] Other Clinician: Referring Provider: Treating Provider/Extender: [name] in Treatment: 0 Active Problems ICD-10 Encounter Code Description Active Date MDM Diagnosis [phone] Non-pressure chronic ulcer of other part of left lower leg with fat layer exposed1/15/2024 No Yes I10 Essential (primary) hypertension 10/29/2022 No Yes R60.0 Localized edema 10/29/2022 No Yes E66.01 Morbid (severe) obesity due to excess calories 10/29/2022 No Yes Inactive Problems Resolved Problems Electronic Signature(s) Signed: 10/29/2022 2:56:25 PM By: Blanche East RN Signed: 10/29/2022 3:23:09 PM By: Fredirick Maudlin MD FACS Previous Signature: 10/29/2022 2:30:47 PM Version By: Fredirick Maudlin MD FACS Previous Signature: 10/29/2022 1:00:51 PM Version By: Fredirick Maudlin MD FACS Entered By: Blanche East on 10/29/2022  14:56:25 -------------------------------------------------------------------------------- Progress Note Details Patient Name: Date of Service: [name] [name], [name] [name]. 10/29/2022 1:15 PM Medical Record Number: [id number] Patient Account Number: [id number] Date of Birth/Sex: Treating RN: 1961-03-04 (62 y.o. M) Primary Care Provider: [name] Other Clinician: Referring Provider: Treating Provider/Extender: [name] in Treatment: 0 Subjective Chief Complaint Olesky, [name] ([id number]) [id number] Page 5 of 10 Information obtained from Patient Patient seen for complaints of Non-Healing Wound. History of Present Illness (HPI) ADMISSION 10/29/2022 This is a 62 year old man with a past medical history significant for obesity, history of DVT borderline diabetes, and gout. He apparently bumped his left mid , pretibial area on a plow in October 2023. he developed a wound in this area that simply has not healed. When he saw his P CP in November, a DVT scan was done that showed an acute thrombus in the gastrocnemius vein in the calf with chronic thrombus in the distal femoral and popliteal veins. He does have a history of prior DVT in this leg. A culture was taken that grew out Pseudomonas and he was prescribed levofloxacin, which has been continued since that time. The leg has been cultured multiple times with persistent isolation of Pseudomonas, heavy growth. The culture reports do indicate sensitivity to levofloxacin. Most recent hemoglobin A1c was 6.5. ABI in clinic today was 1.27. On the anterior tibial surface of the left leg, there is a small circular ulcer with thick yellow slough on the surface. The patient has 2+ pitting edema and discoloration consistent with stasis dermatitis. Patient History Allergies No Known Allergies Family History Heart Disease - Mother, Hypertension - Father, No family history of Cancer, Diabetes,  Hereditary Spherocytosis, Kidney Disease, Lung Disease, Seizures, Stroke, Thyroid Problems, Tuberculosis. Social History Never smoker, Marital Status - Married, Alcohol Use - Never, Drug Use - No History, Caffeine Use - Never. Medical History Eyes Denies history of Cataracts, Optic Neuritis Ear/Nose/Mouth/Throat Denies history of Chronic sinus problems/congestion, Middle ear problems Hematologic/Lymphatic Denies history of Anemia, Hemophilia, Human Immunodeficiency Virus, Lymphedema, Sickle Cell Disease Respiratory Patient has history of Sleep Apnea Cardiovascular Patient has history of Hypertension Gastrointestinal Denies history of Cirrhosis , Colitis, Crohnoos, Hepatitis A, Hepatitis B, Hepatitis C Endocrine Denies history of Type I Diabetes, Type II Diabetes Immunological Denies history of Lupus Erythematosus, Raynaudoos, Scleroderma Integumentary (Skin) Denies history of History of Burn Musculoskeletal Patient has history of Gout - Rt knee Denies history of Rheumatoid Arthritis, Osteoarthritis, Osteomyelitis Neurologic Denies history of Dementia, Neuropathy, Quadriplegia, Paraplegia Oncologic Denies history of Received Chemotherapy, Received Radiation Psychiatric Denies history of Anorexia/bulimia, Confinement Anxiety Medical A Surgical History Notes nd Respiratory lung nodule Genitourinary CKD Review of Systems (ROS) Constitutional Symptoms (General Health) Denies complaints or  symptoms of Fatigue, Fever, Chills, Marked Weight Change. Eyes Complains or has symptoms of Glasses / Contacts - near-sighted. Denies complaints or symptoms of Dry Eyes, Vision Changes. Ear/Nose/Mouth/Throat Denies complaints or symptoms of Chronic sinus problems or rhinitis. Respiratory Denies complaints or symptoms of Chronic or frequent coughs, Shortness of Breath. Cardiovascular Denies complaints or symptoms of Chest pain. Gastrointestinal Denies complaints or symptoms of Frequent  diarrhea, Nausea, GERD Endocrine Denies complaints or symptoms of Heat/cold intolerance. Genitourinary Denies complaints or symptoms of Frequent urination. Integumentary (Skin) Complains or has symptoms of Wounds - Left Anterior lower leg. Musculoskeletal Denies complaints or symptoms of Muscle Pain, Muscle Weakness. Neurologic Denies complaints or symptoms of Numbness/parasthesias. Martindelcampo, [name] [name] ([id number]) [id number] Page 6 of 10 Objective Constitutional No acute distress. Vitals Time Taken: 2:00 PM, Height: 73 in, Source: Stated, Weight: 365 lbs, Source: Stated, BMI: 48.2, Temperature: 98.3 F, Pulse: 62 bpm, Respiratory Rate: 18 breaths/min, Blood Pressure: 134/84 mmHg. Respiratory Normal work of breathing on room air. Cardiovascular 2+ pitting edema to at least the knee on the left; right leg not inspected. General Notes: 10/29/2022: On the anterior tibial surface of the left leg, there is a small circular ulcer with thick yellow slough on the surface. The patient has 2+ pitting edema and discoloration consistent with stasis dermatitis. Integumentary (Hair, Skin) Wound #1 status is Open. Original cause of wound was Trauma. The date acquired was: 07/09/2022. The wound is located on the Left,Anterior Lower Leg. The wound measures 0.6cm length x 0.9cm width x 0.1cm depth; 0.424cm^2 area and 0.042cm^3 volume. There is Fat Layer (Subcutaneous Tissue) exposed. There is no tunneling or undermining noted. There is a medium amount of serous drainage noted. There is medium (34-66%) pink granulation within the wound bed. There is a medium (34-66%) amount of necrotic tissue within the wound bed including Adherent Slough. The periwound skin appearance exhibited: Rash, Dry/Scaly, Erythema. The periwound skin appearance did not exhibit: Maceration. The surrounding wound skin color is noted with erythema. Periwound temperature was noted as No Abnormality. The  periwound has tenderness on palpation. Assessment Active Problems ICD-10 Non-pressure chronic ulcer of other part of left lower leg with fat layer exposed Essential (primary) hypertension Localized edema Morbid (severe) obesity due to excess calories Procedures Wound #1 Pre-procedure diagnosis of Wound #1 is a Venous Leg Ulcer located on the Left,Anterior Lower Leg .Severity of Tissue Pre Debridement is: Fat layer exposed. There was a Selective/Open Wound Non-Viable Tissue Debridement with a total area of 0.54 sq cm performed by Duanne Guess, MD. With the following instrument(s): Curette to remove Non-Viable tissue/material. Material removed includes Eschar and Slough and after achieving pain control using Lidocaine 4% Topical Solution. No specimens were taken. A time out was conducted at 14:19, prior to the start of the procedure. A Minimum amount of bleeding was controlled with Pressure. The procedure was tolerated [name]. Post Debridement Measurements: 0.6cm length x 0.9cm width x 0.1cm depth; 0.042cm^3 volume. Character of Wound/Ulcer Post Debridement requires further debridement. Severity of Tissue Post Debridement is: Fat layer exposed. Post procedure Diagnosis Wound #1: Same as Pre-Procedure General Notes: Scribed for Dr. Lady Gary by Tommie Ard, RN. Pre-procedure diagnosis of Wound #1 is a Venous Leg Ulcer located on the Left,Anterior Lower Leg . There was a Three Layer Compression Therapy Procedure by Tommie Ard, RN. Post procedure Diagnosis Wound #1: Same as Pre-Procedure Plan Follow-up Appointments: Return Appointment in 1 week. - Dr. Lady Gary Rm 4 Anesthetic: Wound #1 Left,Anterior Lower Leg: (In clinic) Topical Lidocaine 5%  applied to wound bed - Prior to debridement Bathing/ Shower/ Hygiene: May shower with protection but do not get wound dressing(s) wet. Protect dressing(s) with water repellant cover (for example, large plastic bag) or a cast cover and may then take  shower. - May purchase cast protector from [facility] or [facility]. The dressing needs to stay dry at all times. If the dressing gets wet, please reach out to [place] and we will make an appt to have the dressing changed. Edema Control - Lymphedema / SCD / Other: Elevate legs to the level of the heart or above for 30 minutes daily and/or when sitting for 3-4 times a day throughout the day. Avoid standing for long periods of time. Exercise regularly Moisturize legs daily. [name] [name], [name] [name] ([id number]) [id number] Page 7 of 10 WOUND #1: - Lower Leg Wound Laterality: Left, Anterior Cleanser: Soap and Water 1 x Per Week/ Discharge Instructions: May shower and wash wound with dial antibacterial soap and water prior to dressing change. Cleanser: Wound Cleanser 1 x Per Week/ Discharge Instructions: Cleanse the wound with wound cleanser prior to applying a clean dressing using gauze sponges, not tissue or cotton balls. Peri-Wound Care: Sween Lotion (Moisturizing lotion) 1 x Per Week/ Discharge Instructions: Apply moisturizing lotion as directed Topical: Gentamicin 1 x Per Week/ Discharge Instructions: As directed by physician Prim Dressing: Sorbalgon AG Dressing 2x2 (in/in) 1 x Per Week/ ary Discharge Instructions: Apply to wound bed as instructed Secured With: Conforming Stretch Gauze Bandage, Sterile 2x75 (in/in) 1 x Per Week/ Discharge Instructions: Secure with stretch gauze as directed. Secured With: Transpore Surgical Tape, 2x10 (in/yd) 1 x Per Week/ Discharge Instructions: Secure dressing with tape as directed. 10/29/2022: This is a 62 year old man who struck his leg on a piece of farm equipment in October. He has a persistent wound that has not healed. On the anterior tibial surface of the left leg, there is a small circular ulcer with thick yellow slough on the surface. The patient has 2+ pitting edema and discoloration consistent with stasis dermatitis. I used a curette to  debride the slough from his wound. He has previously grown out Pseudomonas from his cultures so I am going to use topical gentamicin. I think he can complete what ever prescription of Levaquin he currently is taking and then he likely does not need any more. We will use silver alginate and 3 layer compression. The importance of leg elevation was discussed. Follow-up in 1 week. Electronic Signature(s) Signed: 10/29/2022 2:59:09 PM By: Tommie Ard RN Signed: 10/29/2022 3:23:09 PM By: Duanne Guess MD FACS Previous Signature: 10/29/2022 2:56:48 PM Version By: Tommie Ard RN Previous Signature: 10/29/2022 2:34:54 PM Version By: Duanne Guess MD FACS Entered By: Tommie Ard on 10/29/2022 14:59:09 -------------------------------------------------------------------------------- HxROS Details Patient Name: Date of Service: [name] [name], Jahden [name]. 10/29/2022 1:15 PM Medical Record Number: [id number] Patient Account Number: [id number] Date of Birth/Sex: Treating RN: [birth year] (62 y.o. Valma Cava Primary Care Provider: Abbe Amsterdam Other Clinician: Referring Provider: Treating Provider/Extender: [name] Jetty in Treatment: 0 Constitutional Symptoms (General Health) Complaints and Symptoms: Negative for: Fatigue; Fever; Chills; Marked Weight Change Eyes Complaints and Symptoms: Positive for: Glasses / Contacts - near-sighted Negative for: Dry Eyes; Vision Changes Medical History: Negative for: Cataracts; Optic Neuritis Ear/Nose/Mouth/Throat Complaints and Symptoms: Negative for: Chronic sinus problems or rhinitis Medical History: Negative for: Chronic sinus problems/congestion; Middle ear problems Respiratory Complaints and Symptoms: Negative for: Chronic or frequent coughs; Shortness of Breath Medical History: Positive for: Sleep Apnea  Past Medical History Notes: Pope, [name] [name] MAURIELLO ([id number]) [id number] Page 8 of  10 lung nodule Cardiovascular Complaints and Symptoms: Negative for: Chest pain Medical History: Positive for: Hypertension Gastrointestinal Complaints and Symptoms: Negative for: Frequent diarrhea; Nausea Review of System Notes: GERD Medical History: Negative for: Cirrhosis ; Colitis; Crohns; Hepatitis A; Hepatitis B; Hepatitis C Endocrine Complaints and Symptoms: Negative for: Heat/cold intolerance Medical History: Negative for: Type I Diabetes; Type II Diabetes Genitourinary Complaints and Symptoms: Negative for: Frequent urination Medical History: Past Medical History Notes: CKD Integumentary (Skin) Complaints and Symptoms: Positive for: Wounds - Left Anterior lower leg Medical History: Negative for: History of Burn Musculoskeletal Complaints and Symptoms: Negative for: Muscle Pain; Muscle Weakness Medical History: Positive for: Gout - Rt knee Negative for: Rheumatoid Arthritis; Osteoarthritis; Osteomyelitis Neurologic Complaints and Symptoms: Negative for: Numbness/parasthesias Medical History: Negative for: Dementia; Neuropathy; Quadriplegia; Paraplegia Hematologic/Lymphatic Medical History: Negative for: Anemia; Hemophilia; Human Immunodeficiency Virus; Lymphedema; Sickle Cell Disease Immunological Medical History: Negative for: Lupus Erythematosus; Raynauds; Scleroderma Oncologic Medical History: Negative for: Received Chemotherapy; Received Radiation Psychiatric Medical History: Negative for: Anorexia/bulimia; Confinement Anxiety Bolla, Brysyn [name] ([id number]) [phone].pdf Page 9 of 10 Immunizations Pneumococcal Vaccine: Received Pneumococcal Vaccination: No Implantable Devices None Family and Social History Cancer: No; Diabetes: No; Heart Disease: Yes - Mother; Hereditary Spherocytosis: No; Hypertension: Yes - Father; Kidney Disease: No; Lung Disease: No; Seizures: No; Stroke: No; Thyroid Problems: No; Tuberculosis: No;  Never smoker; Marital Status - Married; Alcohol Use: Never; Drug Use: No History; Caffeine Use: Never; Financial Concerns: No; Food, Clothing or Shelter Needs: No; Support System Lacking: No; Transportation Concerns: No Physician Affirmation I have reviewed and agree with the above information. Electronic Signature(s) Signed: 10/29/2022 3:23:09 PM By: Fredirick Maudlin MD FACS Signed: 10/30/2022 3:04:08 PM By: Blanche East RN Previous Signature: 10/29/2022 12:17:58 PM Version By: Fredirick Maudlin MD FACS Entered By: Blanche East on 10/29/2022 13:52:37 -------------------------------------------------------------------------------- SuperBill Details Patient Name: Date of Service: [name] [name], Bradyn [name]. 10/29/2022 Medical Record Number: [id number] Patient Account Number: [id number] Date of Birth/Sex: Treating RN: Nov 28, 1960 (62 y.o. M) Primary Care Provider: [name] Other Clinician: Referring Provider: Treating Provider/Extender: [name] in Treatment: 0 Diagnosis Coding ICD-10 Codes Code Description [id number] Non-pressure chronic ulcer of other part of left lower leg with fat layer exposed I10 Essential (primary) hypertension R60.0 Localized edema E66.01 Morbid (severe) obesity due to excess calories Facility Procedures : CPT4 Code: [id number] Description: 27078 - WOUND CARE VISIT-LEV 3 EST PT Modifier: 25 Quantity: 1 : CPT4 Code: 67544920 Description: 10071 - DEBRIDE WOUND 1ST 20 SQ CM OR < ICD-10 Diagnosis Description L97.822 Non-pressure chronic ulcer of other part of left lower leg with fat layer expose Modifier: d Quantity: 1 Physician Procedures : CPT4 Code Description Modifier [id number] 32549 - WC PHYS LEVEL 4 - NEW PT 25 ICD-10 Diagnosis Description L97.822 Non-pressure chronic ulcer of other part of left lower leg with fat layer exposed I10 Essential (primary) hypertension R60.0 Localized edema  E66.01 Morbid (severe) obesity due to excess  calories Quantity: 1 Electronic Signature(s) Signed: 10/29/2022 2:56:15 PM By: Blanche East RN Signed: 10/29/2022 3:23:09 PM By: Fredirick Maudlin MD FACS Previous Signature: 10/29/2022 2:35:14 PM Version By: Fredirick Maudlin MD FACS Entered By: Blanche East on 10/29/2022 14:56:15

## 2022-10-30 NOTE — Progress Notes (Signed)
Oppenheimer, [name] ([id number]) [id number] Page 1 of 4 Visit Report for 10/29/2022 Abuse Risk Screen Details Patient Name: Date of Service: [name], [name] 10/29/2022 1:15 PM Medical Record Number: [id number] Patient Account Number: [id number] Date of Birth/Sex: Treating RN: May 27, 1961 (62 y.o. [name] Primary Care [name]: [name] Other Clinician: Referring [name]: Treating [name]/Extender: [name] in Treatment: 0 Abuse Risk Screen Items Answer ABUSE RISK SCREEN: Has anyone close to you tried to hurt or harm you recentlyo No Do you feel uncomfortable with anyone in your familyo No Has anyone forced you do things that you didnt want to doo No Electronic Signature(s) Signed: 10/30/2022 3:04:08 PM By: [name] East RN Entered By: [name] on 10/29/2022 13:53:03 -------------------------------------------------------------------------------- Activities of Daily Living Details Patient Name: Date of Service: [name], [name] 10/29/2022 1:15 PM Medical Record Number: [id number] Patient Account Number: [id number] Date of Birth/Sex: Treating RN: 1961/09/17 (62 y.o. [name] Primary Care [name]: [name] Other Clinician: Referring [name]: Treating [name]/Extender: [name] in Treatment: 0 Activities of Daily Living Items Answer Activities of Daily Living (Please select one for each item) Drive Automobile Completely Able T Medications ake Completely Able Use T elephone Completely Able Care for Appearance Completely Able Use T oilet Completely Able Bath / Shower Completely Able Dress Self Completely Able Feed Self Completely Able Walk Completely Able Get In / Out Bed Completely Able Housework Completely Able Prepare Meals Completely Able Handle Money Completely Able Shop for Self Completely Able Electronic Signature(s) Signed:  10/30/2022 3:04:08 PM By: [name] East RN Entered By: [name] on 10/29/2022 13:53:30 Cross, [name] Logan ([id number]) [id number] Page 2 of 4 -------------------------------------------------------------------------------- Education Screening Details Patient Name: Date of Service: [name], [name] 10/29/2022 1:15 PM Medical Record Number: [id number] Patient Account Number: [id number] Date of Birth/Sex: Treating RN: 08/03/1961 (62 y.o. [name] Primary Care [name]: [name] Other Clinician: Referring [name]: Treating [name]/Extender: [name] in Treatment: 0 Learning Preferences/Education Level/Primary Language Learning Preference: Explanation Highest Education Level: High School Preferred Language: English Cognitive Barrier Language Barrier: No Translator Needed: No Memory Deficit: No Emotional Barrier: No Cultural/Religious Beliefs Affecting Medical Care: No Physical Barrier Impaired Vision: Yes Glasses Impaired Hearing: No Decreased Hand dexterity: No Knowledge/Comprehension Knowledge Level: High Comprehension Level: High Ability to understand written instructions: High Ability to understand verbal instructions: High Motivation Anxiety Level: Calm Cooperation: Cooperative Education Importance: Acknowledges Need Interest in Health Problems: Asks Questions Perception: Coherent Willingness to Engage in Self-Management High Activities: Readiness to Engage in Self-Management High Activities: Electronic Signature(s) Signed: 10/30/2022 3:04:08 PM By: [name] East RN Entered By: [name] on 10/29/2022 13:54:33 -------------------------------------------------------------------------------- Fall Risk Assessment Details Patient Name: Date of Service: [name], [name]. 10/29/2022 1:15 PM Medical Record Number: [id number] Patient Account Number: [id number] Date of Birth/Sex:  Treating RN: 12/21/1960 (62 y.o. [name] Primary Care Anneli Bing: [name] Other Clinician: Referring [name]: Treating [name]/Extender: [name] in Treatment: 0 Fall Risk Assessment Items Have you had 2 or more falls in the last 12 monthso 0 No Have you had any fall that resulted in injury in the last 12 monthso 0 No [name] ([id number]) [id number] Page 3 of 4 FALLS RISK SCREEN History of falling - immediate or within 3 months 0 No Secondary diagnosis (Do you have 2 or more medical diagnoseso) 0 No Ambulatory aid None/bed rest/wheelchair/nurse 0 Yes Crutches/cane/walker 0 No Furniture 0 No  Intravenous therapy Access/Saline/Heparin Lock 0 No Gait/Transferring Normal/ bed rest/ wheelchair 0 Yes Weak (short steps with or without shuffle, stooped but able to lift head while walking, may seek 0 No support from furniture) Impaired (short steps with shuffle, may have difficulty arising from chair, head down, impaired 0 No balance) Mental Status Oriented to own ability 0 Yes Electronic Signature(s) Signed: 10/30/2022 3:04:08 PM By: [name] East RN Entered By: [name] on 10/29/2022 13:55:29 -------------------------------------------------------------------------------- Foot Assessment Details Patient Name: Date of Service: [name], [name]. 10/29/2022 1:15 PM Medical Record Number: [id number] Patient Account Number: [id number] Date of Birth/Sex: Treating RN: 1961-10-10 (62 y.o. [name] Primary Care [name]: [name] Other Clinician: Referring [name]: Treating [name]/Extender: [name] in Treatment: 0 Foot Assessment Items Site Locations + = Sensation present, - = Sensation absent, C = Callus, U = Ulcer Cross = Redness, W = Warmth, M = Maceration, PU = Pre-ulcerative lesion F = Fissure, S = Swelling, D = Dryness Assessment Right:  Left: Other Deformity: No No Prior Foot Ulcer: No No Prior Amputation: No No Charcot Joint: No No Ambulatory Status: Ambulatory Without Help Gait: Steady [name] ([id number]) [id number] Page 4 of 4 Electronic Signature(s) Signed: 10/30/2022 3:04:08 PM By: [name] East RN Entered By: [name] on 10/29/2022 13:57:56 -------------------------------------------------------------------------------- Nutrition Risk Screening Details Patient Name: Date of Service: [name], [name] 10/29/2022 1:15 PM Medical Record Number: [id number] Patient Account Number: [id number] Date of Birth/Sex: Treating RN: 1960/10/18 (62 y.o. [name] Primary Care Tereza Gilham: [name] Other Clinician: Referring Linley Moskal: Treating Janard Culp/Extender: [name] in Treatment: 0 Height (in): Weight (lbs): Body Mass Index (BMI): Nutrition Risk Screening Items Score Screening NUTRITION RISK SCREEN: I have an illness or condition that made me change the kind and/or amount of food I eat 0 No I eat fewer than two meals per day 3 Yes I eat few fruits and vegetables, or milk products 0 No I have three or more drinks of beer, liquor or wine almost every day 0 No I have tooth or mouth problems that make it hard for me to eat 0 No I don't always have enough money to buy the food I need 0 No I eat alone most of the time 0 No I take three or more different prescribed or over-the-counter drugs a day 1 Yes Without wanting to, I have lost or gained 10 pounds in the last six months 0 No I am not always physically able to shop, cook and/or feed myself 0 No Nutrition Protocols Good Risk Protocol 0 No interventions needed Moderate Risk Protocol 0 Provide education on nutrition High Risk Proctocol Risk Level: Moderate Risk Score: 4 Electronic Signature(s) Signed: 10/30/2022 3:04:08 PM By: [name] East RN Entered By: [name] on  10/29/2022 13:56:18

## 2022-11-05 ENCOUNTER — Encounter (HOSPITAL_BASED_OUTPATIENT_CLINIC_OR_DEPARTMENT_OTHER): Payer: BC Managed Care – PPO | Admitting: General Surgery

## 2022-11-05 DIAGNOSIS — L97822 Non-pressure chronic ulcer of other part of left lower leg with fat layer exposed: Secondary | ICD-10-CM | POA: Diagnosis not present

## 2022-11-05 NOTE — Progress Notes (Signed)
[name] [name], [name] [name] ([id number]) [id number] Page 1 of 8 Visit Report for 11/05/2022 Arrival Information Details Patient Name: Date of Service: [name] [name], [name] [name] 11/05/2022 3:00 PM Medical Record Number: [id number] Patient Account Number: [id number] Date of Birth/Sex: Treating RN: 05-Mar-1961 (62 y.o. [name] [name] Primary Care [name] [name]: Abbe Amsterdam Other Clinician: Referring [name] [name]: Treating [name] [name]/Extender: [name] [name] in Treatment: 1 Visit Information History Since Last Visit Added or deleted any medications: No Patient Arrived: Ambulatory Any new allergies or adverse reactions: No Arrival Time: 14:39 Had a fall or experienced change in No Accompanied By: self activities of daily living that may affect Transfer Assistance: None risk of falls: Patient Requires Transmission-Based Precautions: No Signs or symptoms of abuse/neglect since last visito No Patient Has Alerts: Yes Hospitalized since last visit: No Patient Alerts: Patient on Blood Thinner Implantable device outside of the clinic excluding No cellular tissue based products placed in the center since last visit: Has Compression in Place as Prescribed: Yes Pain Present Now: No Electronic Signature(s) Signed: 11/05/2022 4:52:32 PM By: Tommie Ard RN Entered By: Tommie Ard on 11/05/2022 14:40:02 -------------------------------------------------------------------------------- Compression Therapy Details Patient Name: Date of Service: [name] [name], [name] 11/05/2022 3:00 PM Medical Record Number: [id number] Patient Account Number: [id number] Date of Birth/Sex: Treating RN: [birth year] (62 y.o. [name] [name] Primary Care [name] [name]: Abbe Amsterdam Other Clinician: Referring [name] [name]: Treating Lavere Shinsky/Extender: [name] [name] in Treatment: 1 Compression Therapy Performed for Wound Assessment: Wound #1 Left,Anterior Lower  Leg Performed By: Clinician Tommie Ard, RN Compression Type: Three Layer Post Procedure Diagnosis Same as Pre-procedure Electronic Signature(s) Signed: 11/05/2022 4:52:32 PM By: Tommie Ard RN Entered By: Tommie Ard on 11/05/2022 14:56:14 Beldin, [name] [name] ([id number]) [id number] Page 2 of 8 -------------------------------------------------------------------------------- Encounter Discharge Information Details Patient Name: Date of Service: [name] [name], [name] 11/05/2022 3:00 PM Medical Record Number: [id number] Patient Account Number: [id number] Date of Birth/Sex: Treating RN: 1961-09-23 (62 y.o. [name] [name] Primary Care [name] [name]: Abbe [place] Other Clinician: Referring [name] [name]: Treating [name] [name]/Extender: [name] [name] in Treatment: 1 Encounter Discharge Information Items Post Procedure Vitals Discharge Condition: Stable Temperature (F): 98.2 Ambulatory Status: Ambulatory Pulse (bpm): 78 Discharge Destination: Home Respiratory Rate (breaths/min): 18 Transportation: Private Auto Blood Pressure (mmHg): 137/77 Accompanied By: self Schedule Follow-up Appointment: Yes Clinical Summary of Care: Electronic Signature(s) Signed: 11/05/2022 4:52:32 PM By: Tommie Ard RN Entered By: Tommie Ard on 11/05/2022 15:15:28 -------------------------------------------------------------------------------- Lower Extremity Assessment Details Patient Name: Date of Service: [name] [name], [name] 11/05/2022 3:00 PM Medical Record Number: [id number] Patient Account Number: [id number] Date of Birth/Sex: Treating RN: 01-06-1961 (62 y.o. [name] [name] Primary Care Anginette Espejo: Abbe Amsterdam Other Clinician: Referring Randa Riss: Treating [name] [name]/Extender: [name] [name] in Treatment: 1 Edema Assessment Assessed: [Left: No] [Right: No] [Left: Edema] [Right: :] Calf Left: Right: Point of Measurement:  From Medial Instep 50 cm Ankle Left: Right: Point of Measurement: From Medial Instep 30 cm Vascular Assessment Pulses: Dorsalis Pedis Palpable: [Left:Yes] Electronic Signature(s) Signed: 11/05/2022 4:52:32 PM By: Tommie Ard RN Entered By: Tommie Ard on 11/05/2022 14:44:43 Multi Wound Chart Details -------------------------------------------------------------------------------- [name] [name], [name] [name] ([id number]) [id number] Page 3 of 8 Patient Name: Date of Service: [name] [name], [name] [name] 11/05/2022 3:00 PM Medical Record Number: [id number] Patient Account Number: [id number] Date of Birth/Sex: Treating RN: 10-Mar-1961 (62 y.o. M) Primary Care [name] [name]: Abbe [place] Other Clinician: Referring [name] [name]: Treating [name] [name]/Extender: [name] [name] in Treatment: 1 Vital Signs Height(in): 73 Pulse(bpm): 78 Weight(lbs): 365 Blood Pressure(mmHg):  137/77 Body Mass Index(BMI): 48.2 Temperature(F): 98.2 Respiratory Rate(breaths/min): 18 Wound Assessments Wound Number: 1 N/A N/A Photos: N/A N/A Left, Anterior Lower Leg N/A N/A Wound Location: Trauma N/A N/A Wounding Event: Venous Leg Ulcer N/A N/A Primary Etiology: Sleep Apnea, Hypertension, Gout N/A N/A Comorbid History: 07/09/2022 N/A N/A Date Acquired: 1 N/A N/A Weeks of Treatment: Open N/A N/A Wound Status: No N/A N/A Wound Recurrence: 0.3x0.6x0.1 N/A N/A Measurements L x W x D (cm) 0.141 N/A N/A A (cm) : rea 0.014 N/A N/A Volume (cm) : 66.70% N/A N/A % Reduction in A rea: 66.70% N/A N/A % Reduction in Volume: Full Thickness Without Exposed N/A N/A Classification: Support Structures Medium N/A N/A Exudate A mount: Serous N/A N/A Exudate Type: amber N/A N/A Exudate Color: Medium (34-66%) N/A N/A Granulation A mount: Pink N/A N/A Granulation Quality: Medium (34-66%) N/A N/A Necrotic A mount: Fat Layer (Subcutaneous Tissue): Yes N/A N/A Exposed  Structures: Fascia: No Tendon: No Muscle: No Joint: No Bone: No Small (1-33%) N/A N/A Epithelialization: Debridement - Excisional N/A N/A Debridement: Pre-procedure Verification/Time Out 14:53 N/A N/A Taken: Lidocaine 5% topical ointment N/A N/A Pain Control: Subcutaneous, Slough N/A N/A Tissue Debrided: Skin/Subcutaneous Tissue N/A N/A Level: 0.18 N/A N/A Debridement A (sq cm): rea Curette N/A N/A Instrument: Minimum N/A N/A Bleeding: Pressure N/A N/A Hemostasis A chieved: Procedure was tolerated [name] N/A N/A Debridement Treatment Response: 0.3x0.6x0.1 N/A N/A Post Debridement Measurements L x W x D (cm) 0.014 N/A N/A Post Debridement Volume: (cm) Rash: Yes N/A N/A Periwound Skin Texture: Dry/Scaly: Yes N/A N/A Periwound Skin Moisture: Maceration: No Erythema: Yes N/A N/A Periwound Skin Color: No Abnormality N/A N/A Temperature: Yes N/A N/A Tenderness on Palpation: Compression Therapy N/A N/A Procedures Performed: Debridement Treatment Notes Electronic Signature(s) Schinke, Domique R ([id number]) [id number] Page 4 of 8 Signed: 11/05/2022 3:12:27 PM By: Fredirick Maudlin MD FACS Entered By: Fredirick [name] [name] on 11/05/2022 15:12:27 -------------------------------------------------------------------------------- Multi-Disciplinary Care Plan Details Patient Name: Date of Service: Alvan, Theordore R. 11/05/2022 3:00 PM Medical Record Number: [id number] Patient Account Number: [id number] Date of Birth/Sex: Treating RN: [birth year] (62 y.o. Waldron Session Primary Care Connee Ikner: [name] Other Clinician: Referring Druanne Bosques: Treating Romie Tay/Extender: [name] in Treatment: 1 Active Inactive Nutrition Nursing Diagnoses: Imbalanced nutrition Goals: Patient/caregiver agrees to and verbalizes understanding of need to use nutritional supplements and/or vitamins as prescribed Date Initiated:  10/29/2022 Target Resolution Date: 12/13/2022 Goal Status: Active Interventions: Provide education on nutrition Treatment Activities: Dietary management education, guidance and counseling : 10/29/2022 Giving encouragement to exercise : 10/29/2022 Notes: Orientation to the Wound Care Program Nursing Diagnoses: Knowledge deficit related to the wound healing center program Goals: Patient/caregiver will verbalize understanding of the [facility] Date Initiated: 10/29/2022 Target Resolution Date: 01/17/2023 Goal Status: Active Interventions: Provide education on orientation to the wound center Notes: Wound/Skin Impairment Nursing Diagnoses: Impaired tissue integrity Goals: Ulcer/skin breakdown will have a volume reduction of 30% by week 4 Date Initiated: 10/29/2022 Target Resolution Date: 12/10/2022 Goal Status: Active Interventions: Assess ulceration(s) every visit Provide education on ulcer and skin care Treatment Activities: Skin care regimen initiated : 10/29/2022 Topical wound management initiated : 10/29/2022 Notes: Elgersma, JEVEN TOPPER ([id number]) [phone].pdf Page 5 of 8 Electronic Signature(s) Signed: 11/05/2022 4:52:32 PM By: Blanche East RN Entered By: Blanche East on 11/05/2022 14:58:23 -------------------------------------------------------------------------------- Pain Assessment Details Patient Name: Date of Service: [name] COREYON, NICOTRA. 11/05/2022 3:00 PM Medical Record Number: [id number] Patient Account Number: [id number] Date of Birth/Sex: Treating RN:  16-Oct-1960 (61 y.o. [name] [name] Primary Care Shadoe Bethel: Abbe Amsterdam Other Clinician: Referring Hailey Stormer: Treating Kaicen Desena/Extender: [name] [name] in Treatment: 1 Active Problems Location of Pain Severity and Description of Pain Patient Has Paino No Site Locations Rate the pain. Current Pain Level: 0 Pain Management and Medication Current  Pain Management: Electronic Signature(s) Signed: 11/05/2022 4:52:32 PM By: Tommie Ard RN Entered By: Tommie Ard on 11/05/2022 14:41:08 -------------------------------------------------------------------------------- Patient/Caregiver Education Details Patient Name: Date of Service: [name] [name], [name] [name] 1/22/2024andnbsp3:00 PM Medical Record Number: [id number] Patient Account Number: [id number] Date of Birth/Gender: Treating RN: May 27, 1961 (62 y.o. [name] [name] Primary Care Physician: Abbe Amsterdam Other Clinician: Referring Physician: Treating Physician/Extender: [name] [name] in Treatment: 1 Education Assessment Education Provided To: Patient Mcgibbon, DERRIN CURREY ([id number]) [id number] Page 6 of 8 Education Topics Provided Wound/Skin Impairment: Methods: Explain/Verbal Responses: Reinforcements needed, State content correctly Electronic Signature(s) Signed: 11/05/2022 4:52:32 PM By: Tommie Ard RN Entered By: Tommie Ard on 11/05/2022 14:58:39 -------------------------------------------------------------------------------- Wound Assessment Details Patient Name: Date of Service: [name] [name], Linnie R. 11/05/2022 3:00 PM Medical Record Number: [id number] Patient Account Number: [id number] Date of Birth/Sex: Treating RN: 04-30-1961 (62 y.o. [name] [name] Primary Care Ronal Maybury: Abbe [place] Other Clinician: Referring Dione Petron: Treating Davionne Dowty/Extender: [name] [name] in Treatment: 1 Wound Status Wound Number: 1 Primary Etiology: Venous Leg Ulcer Wound Location: Left, Anterior Lower Leg Wound Status: Open Wounding Event: Trauma Comorbid History: Sleep Apnea, Hypertension, Gout Date Acquired: 07/09/2022 Weeks Of Treatment: 1 Clustered Wound: No Photos Wound Measurements Length: (cm) 0.3 Width: (cm) 0.6 Depth: (cm) 0.1 Area: (cm) 0.141 Volume: (cm) 0.014 % Reduction in Area:  66.7% % Reduction in Volume: 66.7% Epithelialization: Small (1-33%) Tunneling: No Undermining: No Wound Description Classification: Full Thickness Without Exposed Support Exudate Amount: Medium Exudate Type: Serous Exudate Color: amber Structures Foul Odor After Cleansing: No Slough/Fibrino Yes Wound Bed Granulation Amount: Medium (34-66%) Exposed Structure Granulation Quality: Pink Fascia Exposed: No Necrotic Amount: Medium (34-66%) Fat Layer (Subcutaneous Tissue) Exposed: Yes Necrotic Quality: Adherent Slough Tendon Exposed: No Muscle Exposed: No Joint Exposed: No Bone Exposed: No Periwound Skin Texture Godman, Khalil R ([id number]) [id number] Page 7 of 8 Texture Color No Abnormalities Noted: No No Abnormalities Noted: No Rash: Yes Erythema: Yes Moisture Temperature / Pain No Abnormalities Noted: No Temperature: No Abnormality Dry / Scaly: Yes Tenderness on Palpation: Yes Maceration: No Treatment Notes Wound #1 (Lower Leg) Wound Laterality: Left, Anterior Cleanser Soap and Water Discharge Instruction: May shower and wash wound with dial antibacterial soap and water prior to dressing change. Wound Cleanser Discharge Instruction: Cleanse the wound with wound cleanser prior to applying a clean dressing using gauze sponges, not tissue or cotton balls. Peri-Wound Care Sween Lotion (Moisturizing lotion) Discharge Instruction: Apply moisturizing lotion as directed Topical Gentamicin Discharge Instruction: As directed by physician Primary Dressing Sorbalgon AG Dressing 2x2 (in/in) Discharge Instruction: Apply to wound bed as instructed Secondary Dressing Secured With Conforming Stretch Gauze Bandage, Sterile 2x75 (in/in) Discharge Instruction: Secure with stretch gauze as directed. Transpore Surgical Tape, 2x10 (in/yd) Discharge Instruction: Secure dressing with tape as directed. Compression Wrap Compression  Stockings Add-Ons Electronic Signature(s) Signed: 11/05/2022 4:52:32 PM By: Tommie Ard RN Entered By: Tommie Ard on 11/05/2022 14:47:53 -------------------------------------------------------------------------------- Vitals Details Patient Name: Date of Service: [name] [name], Elihue R. 11/05/2022 3:00 PM Medical Record Number: [id number] Patient Account Number: [id number] Date of Birth/Sex: Treating RN: 09-17-1961 (63 y.o. [name] [name] Primary Care Adamari Frede: Abbe Amsterdam Other Clinician: Referring Sankalp Ferrell:  Treating Octa Uplinger/Extender: [name] in Treatment: 1 Vital Signs Time Taken: 14:35 Temperature (F): 98.2 Height (in): 73 Pulse (bpm): 78 Weight (lbs): 365 Respiratory Rate (breaths/min): 18 Body Mass Index (BMI): 48.2 Blood Pressure (mmHg): 137/77 Reference Range: 80 - 120 mg / dl Electronic Signature(s) Runner, Montario R ([id number]) [id number].pdf Page 8 of 8 Signed: 11/05/2022 4:52:32 PM By: Blanche East RN Entered By: Blanche East on 11/05/2022 14:48:55

## 2022-11-05 NOTE — Progress Notes (Signed)
Mccallum, JOANTHAN HLAVACEK ([id number]) [id number] Page 1 of 9 Visit Report for 11/05/2022 Chief Complaint Document Details Patient Name: Date of Service: [name], [name] 11/05/2022 3:00 PM Medical Record Number: [id number] Patient Account Number: [id number] Date of Birth/Sex: Treating RN: 03/09/1961 (62 y.o. M) Primary Care Provider: [name] Other Clinician: Referring Provider: Treating Provider/Extender: [name] in Treatment: 1 Information Obtained from: Patient Chief Complaint Patient seen for complaints of Non-Healing Wound. Electronic Signature(s) Signed: 11/05/2022 3:12:34 PM By: Fredirick Maudlin MD FACS Entered By: Fredirick Maudlin on 11/05/2022 15:12:34 -------------------------------------------------------------------------------- Debridement Details Patient Name: Date of Service: [name], [name] 11/05/2022 3:00 PM Medical Record Number: [id number] Patient Account Number: [id number] Date of Birth/Sex: Treating RN: 1961/01/21 (62 y.o. Waldron Session Primary Care Provider: [name] Other Clinician: Referring Provider: Treating Provider/Extender: [name] in Treatment: 1 Debridement Performed for Assessment: Wound #1 Left,Anterior Lower Leg Performed By: Physician Fredirick Maudlin, MD Debridement Type: Debridement Severity of Tissue Pre Debridement: Fat layer exposed Level of Consciousness (Pre-procedure): Awake and Alert Pre-procedure Verification/Time Out Yes - 14:53 Taken: Start Time: 14:54 Pain Control: Lidocaine 5% topical ointment T Area Debrided (L x W): otal 0.3 (cm) x 0.6 (cm) = 0.18 (cm) Tissue and other material debrided: Viable, Non-Viable, Slough, Subcutaneous, Slough Level: Skin/Subcutaneous Tissue Debridement Description: Excisional Instrument: Curette Bleeding: Minimum Hemostasis Achieved: Pressure Response to Treatment: Procedure was tolerated  Cross Level of Consciousness (Post- Awake and Alert procedure): Post Debridement Measurements of Total Wound Length: (cm) 0.3 Width: (cm) 0.6 Depth: (cm) 0.1 Volume: (cm) 0.014 Character of Wound/Ulcer Post Debridement: Improved Severity of Tissue Post Debridement: Fat layer exposed Post Procedure Diagnosis Same as Pre-procedure [place]street, [place] ([id number]) [id number] Page 2 of 9 Notes Scribed for Dr. Celine Ahr by Blanche East, RN Electronic Signature(s) Signed: 11/05/2022 4:52:32 PM By: Blanche East RN Signed: 11/05/2022 5:26:04 PM By: Fredirick Maudlin MD FACS Entered By: Blanche East on 11/05/2022 14:56:02 -------------------------------------------------------------------------------- HPI Details Patient Name: Date of Service: [name], [name] 11/05/2022 3:00 PM Medical Record Number: [id number] Patient Account Number: [id number] Date of Birth/Sex: Treating RN: [birth year] (62 y.o. M) Primary Care Provider: [name] Other Clinician: Referring Provider: Treating Provider/Extender: [name] in Treatment: 1 History of Present Illness HPI Description: ADMISSION 10/29/2022 This is a 62 year old man with a past medical history significant for obesity, history of DVT borderline diabetes, and gout. [name] apparently bumped his left mid , pretibial area on a plow in October 2023. [name] developed a wound in this area that simply has not healed. When [name] saw his P CP in November, a DVT scan was done that showed an acute thrombus in the gastrocnemius vein in the calf with chronic thrombus in the distal femoral and popliteal veins. [name] does have a history of prior DVT in this leg. A culture was taken that grew out Pseudomonas and [name] was prescribed levofloxacin, which has been continued since that time. The leg has been cultured multiple times with persistent isolation of Pseudomonas, heavy growth. The culture reports do indicate  sensitivity to levofloxacin. Most recent hemoglobin A1c was 6.5. ABI in clinic today was 1.27. On the anterior tibial surface of the left leg, there is a small circular ulcer with thick yellow slough on the surface. The patient has 2+ pitting edema and discoloration consistent with stasis dermatitis. 11/05/2022: The wound was smaller by half this week. There is still some slough accumulation on the surface. Edema control is better. Electronic Signature(s) Signed:  11/05/2022 3:13:04 PM By: Fredirick Maudlin MD FACS Entered By: Fredirick Maudlin on 11/05/2022 15:13:04 -------------------------------------------------------------------------------- Physical Exam Details Patient Name: Date of Service: [name], [name] 11/05/2022 3:00 PM Medical Record Number: [id number] Patient Account Number: [id number] Date of Birth/Sex: Treating RN: 1960-12-17 (62 y.o. M) Primary Care Provider: [name] Other Clinician: Referring Provider: Treating Provider/Extender: [name] in Treatment: 1 Constitutional . . . . no acute distress. Respiratory Normal work of breathing on room air. Notes 11/05/2022: The wound is smaller by half this week. There is still some slough accumulation on the surface. Edema control is better. Electronic Signature(s) Signed: 11/05/2022 3:13:40 PM By: Fredirick Maudlin MD Boswell, RONTRELL MOQUIN ([id number]) By: Fredirick Maudlin MD FACS [phone].pdf Page 3 of 9 Signed: 11/05/2022 3:13:40 PM Entered By: Fredirick Maudlin on 11/05/2022 15:13:39 -------------------------------------------------------------------------------- Physician Orders Details Patient Name: Date of Service: [name], FITZHENRY. 11/05/2022 3:00 PM Medical Record Number: [id number] Patient Account Number: [id number] Date of Birth/Sex: Treating RN: 01-28-1961 (62 y.o. Waldron Session Primary Care Provider: [name] Other Clinician: Referring  Provider: Treating Provider/Extender: [name] in Treatment: 1 Verbal / Phone Orders: No Diagnosis Coding ICD-10 Coding Code Description [phone] Non-pressure chronic ulcer of other part of left lower leg with fat layer exposed I10 Essential (primary) hypertension R60.0 Localized edema E66.01 Morbid (severe) obesity due to excess calories Follow-up Appointments ppointment in 1 week. - Dr. Celine Ahr Rm 4 Return A Monday 11/12/22 at 2:15pm Anesthetic Wound #1 Left,Anterior Lower Leg (In clinic) Topical Lidocaine 5% applied to wound bed - Prior to debridement Bathing/ Shower/ Hygiene May shower with protection but do not get wound dressing(s) wet. Protect dressing(s) with water repellant cover (for example, large plastic bag) or a cast cover and may then take shower. - May purchase cast protector from [place] or [facility]. The dressing needs to stay dry at all times. If the dressing gets wet, please reach out to [place] and we will make an appt to have the dressing changed. Edema Control - Lymphedema / SCD / Other Left Lower Extremity Elevate legs to the level of the heart or above for 30 minutes daily and/or when sitting for 3-4 times a day throughout the day. Avoid standing for long periods of time. Exercise regularly Moisturize legs daily. Wound Treatment Wound #1 - Lower Leg Wound Laterality: Left, Anterior Cleanser: Soap and Water 1 x Per Week Discharge Instructions: May shower and wash wound with dial antibacterial soap and water prior to dressing change. Cleanser: Wound Cleanser 1 x Per Week Discharge Instructions: Cleanse the wound with wound cleanser prior to applying a clean dressing using gauze sponges, not tissue or cotton balls. Peri-Wound Care: Sween Lotion (Moisturizing lotion) 1 x Per Week Discharge Instructions: Apply moisturizing lotion as directed Topical: Gentamicin 1 x Per Week Discharge Instructions: As directed by physician Prim Dressing:  Sorbalgon AG Dressing 2x2 (in/in) 1 x Per Week ary Discharge Instructions: Apply to wound bed as instructed Secured With: Conforming Stretch Gauze Bandage, Sterile 2x75 (in/in) 1 x Per Week Discharge Instructions: Secure with stretch gauze as directed. Secured With: Transpore Surgical Tape, 2x10 (in/yd) 1 x Per Week Discharge Instructions: Secure dressing with tape as directed. Electronic Signature(s) Fitzgerald, KREE RAFTER ([id number]) [id number] Page 4 of 9 Signed: 11/05/2022 5:26:04 PM By: Fredirick Maudlin MD FACS Entered By: Fredirick Maudlin on 11/05/2022 15:14:11 -------------------------------------------------------------------------------- Problem List Details Patient Name: Date of Service: [place], Bernhard R. 11/05/2022 3:00 PM Medical Record Number: [id number] Patient Account Number:  [id number] Date of Birth/Sex: Treating RN: 09/13/1961 (62 y.o. M) Primary Care Provider: Abbe [place]op[place], Jessica Other Clinician: Referring Provider: Treating Provider/Extender: San Jettyannon, Shaneeka Scarboro [name], Jessica Weeks in Treatment: 1 Active Problems ICD-10 Encounter Code Description Active Date MDM Diagnosis [phone] Non-pressure chronic ulcer of other part of left lower leg with fat layer exposed1/15/2024 No Yes I10 Essential (primary) hypertension 10/29/2022 No Yes R60.0 Localized edema 10/29/2022 No Yes E66.01 Morbid (severe) obesity due to excess calories 10/29/2022 No Yes Inactive Problems Resolved Problems Electronic Signature(s) Signed: 11/05/2022 3:12:18 PM By: Duanne Guessannon, Annalia Metzger MD FACS Entered By: Duanne Guessannon, Laveda Demedeiros on 11/05/2022 15:12:17 -------------------------------------------------------------------------------- Progress Note Details Patient Name: Date of Service: [name], [name] 11/05/2022 3:00 PM Medical Record Number: [id number] Patient Account Number: [id number] Date of Birth/Sex: Treating RN: 09/13/1961 (62 y.o. M) Primary Care Provider: Abbe [place], Jessica  Other Clinician: Referring Provider: Treating Provider/Extender: San Jettyannon, Ahnna Dungan [name], Jessica Weeks in Treatment: 1 Subjective Chief Complaint Information obtained from Patient Patient seen for complaints of Non-Healing Wound. [name], [name] MerlinERRY R ([id number]) [id number] Page 5 of 9 History of Present Illness (HPI) ADMISSION 10/29/2022 This is a 62 year old man with a past medical history significant for obesity, history of DVT borderline diabetes, and gout. [name] apparently bumped his left mid , pretibial area on a plow in October 2023. [name] developed a wound in this area that simply has not healed. When [name] saw his P CP in November, a DVT scan was done that showed an acute thrombus in the gastrocnemius vein in the calf with chronic thrombus in the distal femoral and popliteal veins. [name] does have a history of prior DVT in this leg. A culture was taken that grew out Pseudomonas and [name] was prescribed levofloxacin, which has been continued since that time. The leg has been cultured multiple times with persistent isolation of Pseudomonas, heavy growth. The culture reports do indicate sensitivity to levofloxacin. Most recent hemoglobin A1c was 6.5. ABI in clinic today was 1.27. On the anterior tibial surface of the left leg, there is a small circular ulcer with thick yellow slough on the surface. The patient has 2+ pitting edema and discoloration consistent with stasis dermatitis. 11/05/2022: The wound was smaller by half this week. There is still some slough accumulation on the surface. Edema control is better. Patient History Family History Heart Disease - Mother, Hypertension - Father, No family history of Cancer, Diabetes, Hereditary Spherocytosis, Kidney Disease, Lung Disease, Seizures, Stroke, Thyroid Problems, Tuberculosis. Social History Never smoker, Marital Status - Married, Alcohol Use - Never, Drug Use - No History, Caffeine Use - Never. Medical  History Eyes Denies history of Cataracts, Optic Neuritis Ear/Nose/Mouth/Throat Denies history of Chronic sinus problems/congestion, Middle ear problems Hematologic/Lymphatic Denies history of Anemia, Hemophilia, Human Immunodeficiency Virus, Lymphedema, Sickle Cell Disease Respiratory Patient has history of Sleep Apnea Cardiovascular Patient has history of Hypertension Gastrointestinal Denies history of Cirrhosis , Colitis, Crohnoos, Hepatitis A, Hepatitis B, Hepatitis C Endocrine Denies history of Type I Diabetes, Type II Diabetes Immunological Denies history of Lupus Erythematosus, Raynaudoos, Scleroderma Integumentary (Skin) Denies history of History of Burn Musculoskeletal Patient has history of Gout - Rt knee Denies history of Rheumatoid Arthritis, Osteoarthritis, Osteomyelitis Neurologic Denies history of Dementia, Neuropathy, Quadriplegia, Paraplegia Oncologic Denies history of Received Chemotherapy, Received Radiation Psychiatric Denies history of Anorexia/bulimia, Confinement Anxiety Medical A Surgical History Notes nd Respiratory lung nodule Genitourinary CKD Objective Constitutional no acute distress. Vitals Time Taken: 2:35 PM, Height: 73 in, Weight: 365 lbs, BMI: 48.2, Temperature: 98.2 F, Pulse: 78 bpm, Respiratory Rate: 18  breaths/min, Blood Pressure: 137/77 mmHg. Respiratory Normal work of breathing on room air. General Notes: 11/05/2022: The wound is smaller by half this week. There is still some slough accumulation on the surface. Edema control is better. Integumentary (Hair, Skin) Wound #1 status is Open. Original cause of wound was Trauma. The date acquired was: 07/09/2022. The wound has been in treatment 1 weeks. The wound is located on the Left,Anterior Lower Leg. The wound measures 0.3cm length x 0.6cm width x 0.1cm depth; 0.141cm^2 area and 0.014cm^3 volume. There is Fat Layer (Subcutaneous Tissue) exposed. There is no tunneling or undermining  noted. There is a medium amount of serous drainage noted. There is medium (34- 66%) pink granulation within the wound bed. There is a medium (34-66%) amount of necrotic tissue within the wound bed including Adherent Slough. The periwound skin appearance exhibited: Rash, Dry/Scaly, Erythema. The periwound skin appearance did not exhibit: Maceration. The surrounding wound skin color is noted with erythema. Periwound temperature was noted as No Abnormality. The periwound has tenderness on palpation. [name], [name] ([id number]) [id number] Page 6 of 9 Assessment Active Problems ICD-10 Non-pressure chronic ulcer of other part of left lower leg with fat layer exposed Essential (primary) hypertension Localized edema Morbid (severe) obesity due to excess calories Procedures Wound #1 Pre-procedure diagnosis of Wound #1 is a Venous Leg Ulcer located on the Left,Anterior Lower Leg .Severity of Tissue Pre Debridement is: Fat layer exposed. There was a Excisional Skin/Subcutaneous Tissue Debridement with a total area of 0.18 sq cm performed by Duanne Guess, MD. With the following instrument(s): Curette to remove Viable and Non-Viable tissue/material. Material removed includes Subcutaneous Tissue and Slough and after achieving pain control using Lidocaine 5% topical ointment. No specimens were taken. A time out was conducted at 14:53, prior to the start of the procedure. A Minimum amount of bleeding was controlled with Pressure. The procedure was tolerated Cross. Post Debridement Measurements: 0.3cm length x 0.6cm width x 0.1cm depth; 0.014cm^3 volume. Character of Wound/Ulcer Post Debridement is improved. Severity of Tissue Post Debridement is: Fat layer exposed. Post procedure Diagnosis Wound #1: Same as Pre-Procedure General Notes: Scribed for Dr. Lady Gary by Tommie Ard, RN. Pre-procedure diagnosis of Wound #1 is a Venous Leg Ulcer located on the Left,Anterior Lower Leg .  There was a Three Layer Compression Therapy Procedure by Tommie Ard, RN. Post procedure Diagnosis Wound #1: Same as Pre-Procedure Plan Follow-up Appointments: Return Appointment in 1 week. - Dr. Lady Gary Rm 4 Monday 11/12/22 at 2:15pm Anesthetic: Wound #1 Left,Anterior Lower Leg: (In clinic) Topical Lidocaine 5% applied to wound bed - Prior to debridement Bathing/ Shower/ Hygiene: May shower with protection but do not get wound dressing(s) wet. Protect dressing(s) with water repellant cover (for example, large plastic bag) or a cast cover and may then take shower. - May purchase cast protector from [facility] or [facility]. The dressing needs to stay dry at all times. If the dressing gets wet, please reach out to [place] and we will make an appt to have the dressing changed. Edema Control - Lymphedema / SCD / Other: Elevate legs to the level of the heart or above for 30 minutes daily and/or when sitting for 3-4 times a day throughout the day. Avoid standing for long periods of time. Exercise regularly Moisturize legs daily. WOUND #1: - Lower Leg Wound Laterality: Left, Anterior Cleanser: Soap and Water 1 x Per Week/ Discharge Instructions: May shower and wash wound with dial antibacterial soap and water prior to dressing change. Cleanser: Wound  Cleanser 1 x Per Week/ Discharge Instructions: Cleanse the wound with wound cleanser prior to applying a clean dressing using gauze sponges, not tissue or cotton balls. Peri-Wound Care: Sween Lotion (Moisturizing lotion) 1 x Per Week/ Discharge Instructions: Apply moisturizing lotion as directed Topical: Gentamicin 1 x Per Week/ Discharge Instructions: As directed by physician Prim Dressing: Sorbalgon AG Dressing 2x2 (in/in) 1 x Per Week/ ary Discharge Instructions: Apply to wound bed as instructed Secured With: Conforming Stretch Gauze Bandage, Sterile 2x75 (in/in) 1 x Per Week/ Discharge Instructions: Secure with stretch gauze as directed. Secured With:  Transpore Surgical T ape, 2x10 (in/yd) 1 x Per Week/ Discharge Instructions: Secure dressing with tape as directed. 11/05/2022: The wound is smaller by half this week. There is still some slough accumulation on the surface. Edema control is better. I used a curette to debride slough and nonviable subcutaneous tissue from the wound. We will continue with topical gentamicin, silver alginate, and 3 layer compression. Follow-up in 1 week. Electronic Signature(s) Signed: 11/05/2022 3:16:56 PM By: Duanne Guess MD FACS Reczek, [name] ([id number]) [id number] Page 7 of 9 Entered By: Duanne Guess on 11/05/2022 15:16:56 -------------------------------------------------------------------------------- HxROS Details Patient Name: Date of Service: [name], HECKARD 11/05/2022 3:00 PM Medical Record Number: [id number] Patient Account Number: [id number] Date of Birth/Sex: Treating RN: 09/28/1961 (62 y.o. M) Primary Care Provider: Abbe [place] Other Clinician: Referring Provider: Treating Provider/Extender: San Jetty in Treatment: 1 Eyes Medical History: Negative for: Cataracts; Optic Neuritis Ear/Nose/Mouth/Throat Medical History: Negative for: Chronic sinus problems/congestion; Middle ear problems Hematologic/Lymphatic Medical History: Negative for: Anemia; Hemophilia; Human Immunodeficiency Virus; Lymphedema; Sickle Cell Disease Respiratory Medical History: Positive for: Sleep Apnea Past Medical History Notes: lung nodule Cardiovascular Medical History: Positive for: Hypertension Gastrointestinal Medical History: Negative for: Cirrhosis ; Colitis; Crohns; Hepatitis A; Hepatitis B; Hepatitis C Endocrine Medical History: Negative for: Type I Diabetes; Type II Diabetes Genitourinary Medical History: Past Medical History Notes: CKD Immunological Medical History: Negative for: Lupus Erythematosus; Raynauds;  Scleroderma Integumentary (Skin) Medical History: Negative for: History of Burn Musculoskeletal Medical History: Positive for: Gout - Rt knee Negative for: Rheumatoid Arthritis; Osteoarthritis; Osteomyelitis Ferrebee, Hobie R ([id number]) [id number] Page 8 of 9 Neurologic Medical History: Negative for: Dementia; Neuropathy; Quadriplegia; Paraplegia Oncologic Medical History: Negative for: Received Chemotherapy; Received Radiation Psychiatric Medical History: Negative for: Anorexia/bulimia; Confinement Anxiety Immunizations Pneumococcal Vaccine: Received Pneumococcal Vaccination: No Implantable Devices None Family and Social History Cancer: No; Diabetes: No; Heart Disease: Yes - Mother; Hereditary Spherocytosis: No; Hypertension: Yes - Father; Kidney Disease: No; Lung Disease: No; Seizures: No; Stroke: No; Thyroid Problems: No; Tuberculosis: No; Never smoker; Marital Status - Married; Alcohol Use: Never; Drug Use: No History; Caffeine Use: Never; Financial Concerns: No; Food, Clothing or Shelter Needs: No; Support System Lacking: No; Transportation Concerns: No Electronic Signature(s) Signed: 11/05/2022 5:26:04 PM By: Duanne Guess MD FACS Entered By: Duanne Guess on 11/05/2022 15:13:10 -------------------------------------------------------------------------------- SuperBill Details Patient Name: Date of Service: [name], Rene R. 11/05/2022 Medical Record Number: [id number] Patient Account Number: [id number] Date of Birth/Sex: Treating RN: 1961/03/18 (62 y.o. M) Primary Care Provider: Abbe Amsterdam Other Clinician: Referring Provider: Treating Provider/Extender: San Jetty in Treatment: 1 Diagnosis Coding ICD-10 Codes Code Description [phone] Non-pressure chronic ulcer of other part of left lower leg with fat layer exposed I10 Essential (primary) hypertension R60.0 Localized edema E66.01 Morbid (severe)  obesity due to excess calories Facility Procedures : CPT4 Code: [id number] Description: 11042 - DEB SUBQ TISSUE 20 SQ CM/< ICD-10  Diagnosis Description L97.822 Non-pressure chronic ulcer of other part of left lower leg with fat layer expo Modifier: sed Quantity: 1 Physician Procedures : CPT4 Code Description Modifier [id number] - WC PHYS LEVEL 4 - EST PT 25 ICD-10 Diagnosis Description L97.822 Non-pressure chronic ulcer of other part of left lower leg with fat layer exposed R60.0 Localized edema I10 Essential (primary) hypertension  Muldrow, Murel R ([id number]) [id number] Morbid (severe) obesity due to excess calories Quantity: 1 [id number].pdf Page 9 of 9 : [id number] 11042 - WC PHYS SUBQ TISS 20 SQ CM ICD-10 Diagnosis Description L97.822 Non-pressure chronic ulcer of other part of left lower leg with fat layer exposed Quantity: 1 Electronic Signature(s) Signed: 11/05/2022 3:17:14 PM By: Duanne Guess MD FACS Entered By: Duanne Guess on 11/05/2022 15:17:13

## 2022-11-12 ENCOUNTER — Encounter (HOSPITAL_BASED_OUTPATIENT_CLINIC_OR_DEPARTMENT_OTHER): Payer: BC Managed Care – PPO | Admitting: General Surgery

## 2022-11-12 DIAGNOSIS — L97822 Non-pressure chronic ulcer of other part of left lower leg with fat layer exposed: Secondary | ICD-10-CM | POA: Diagnosis not present

## 2022-11-12 NOTE — Progress Notes (Signed)
[name], [name] ([id number]) [id number] Page 1 of 9 Visit Report for 11/12/2022 Chief Complaint Document Details Patient Name: Date of Service: [name], [name] 11/12/2022 2:15 PM Medical Record Number: [id number] Patient Account Number: [id number] Date of Birth/Sex: Treating RN: [birth year] (62 y.o. M) Primary Care Provider: [name] Other Clinician: Referring Provider: Treating Provider/Extender: [name] in Treatment: 2 Information Obtained from: Patient Chief Complaint Patient seen for complaints of Non-Healing Wound. Electronic Signature(s) Signed: 11/12/2022 3:11:51 PM By: Fredirick Maudlin MD FACS Entered By: Fredirick Maudlin on 11/12/2022 15:11:51 -------------------------------------------------------------------------------- Debridement Details Patient Name: Date of Service: [name], [name] 11/12/2022 2:15 PM Medical Record Number: [id number] Patient Account Number: [id number] Date of Birth/Sex: Treating RN: 12-03-1960 (62 y.o. [name] Primary Care Provider: [name] Other Clinician: Referring Provider: Treating Provider/Extender: [name] in Treatment: 2 Debridement Performed for Assessment: Wound #1 Left,Anterior Lower Leg Performed By: Physician Fredirick Maudlin, MD Debridement Type: Debridement Severity of Tissue Pre Debridement: Fat layer exposed Level of Consciousness (Pre-procedure): Awake and Alert Pre-procedure Verification/Time Out Yes - 14:40 Taken: Start Time: 14:40 Pain Control: Lidocaine 5% topical ointment T Area Debrided (L x W): otal 0.8 (cm) x 1 (cm) = 0.8 (cm) Tissue and other material debrided: Non-Viable, Slough, Subcutaneous, Slough Level: Skin/Subcutaneous Tissue Debridement Description: Excisional Instrument: Curette Bleeding: Minimum Hemostasis Achieved: Pressure Response to Treatment: Procedure was tolerated  Cross Level of Consciousness (Post- Awake and Alert procedure): Post Debridement Measurements of Total Wound Length: (cm) 0.8 Width: (cm) 1 Depth: (cm) 0.1 Volume: (cm) 0.063 Character of Wound/Ulcer Post Debridement: Improved Severity of Tissue Post Debridement: Fat layer exposed Post Procedure Diagnosis Same as Pre-procedure [name], Cross ([id number]) [id number] Page 2 of 9 Notes scribed for Dr. Celine Ahr by Adline Peals, RN Electronic Signature(s) Signed: 11/12/2022 4:07:33 PM By: Fredirick Maudlin MD FACS Signed: 11/12/2022 4:46:33 PM By: Sabas Sous By: Adline Peals on 11/12/2022 14:41:28 -------------------------------------------------------------------------------- HPI Details Patient Name: Date of Service: [name], [name] 11/12/2022 2:15 PM Medical Record Number: [id number] Patient Account Number: [id number] Date of Birth/Sex: Treating RN: 09/22/1961 (62 y.o. M) Primary Care Provider: [name] Other Clinician: Referring Provider: Treating Provider/Extender: [name] in Treatment: 2 History of Present Illness HPI Description: ADMISSION 10/29/2022 This is a 62 year old man with a past medical history significant for obesity, history of DVT borderline diabetes, and gout. He apparently bumped his left mid , pretibial area on a plow in October 2023. he developed a wound in this area that simply has not healed. When he saw his P CP in November, a DVT scan was done that showed an acute thrombus in the gastrocnemius vein in the calf with chronic thrombus in the distal femoral and popliteal veins. He does have a history of prior DVT in this leg. A culture was taken that grew out Pseudomonas and he was prescribed levofloxacin, which has been continued since that time. The leg has been cultured multiple times with persistent isolation of Pseudomonas, heavy growth. The culture reports do  indicate sensitivity to levofloxacin. Most recent hemoglobin A1c was 6.5. ABI in clinic today was 1.27. On the anterior tibial surface of the left leg, there is a small circular ulcer with thick yellow slough on the surface. The patient has 2+ pitting edema and discoloration consistent with stasis dermatitis. 11/05/2022: The wound was smaller by half this week. There is still some slough accumulation on the surface. Edema control is better. 11/12/2022: For some reason his  wound measured larger today, although it does not appear to be so. There is some light slough on the surface. Electronic Signature(s) Signed: 11/12/2022 3:12:23 PM By: [name] Guess MD FACS Entered By: [name] on 11/12/2022 15:12:23 -------------------------------------------------------------------------------- Physical Exam Details Patient Name: Date of Service: [name], [name] 11/12/2022 2:15 PM Medical Record Number: [id number] Patient Account Number: [id number] Date of Birth/Sex: Treating RN: 1960-11-18 (62 y.o. M) Primary Care Provider: Abbe Amsterdam Other Clinician: Referring Provider: Treating Provider/Extender: San Jetty in Treatment: 2 Constitutional . . . . no acute distress. Respiratory Normal work of breathing on room air. Notes 11/12/2022: For some reason his wound measured larger today, although it does not appear to be so. There is some light slough on the surface. Brathwaite, GOTTFRIED STANDISH ([id number]) [id number] Page 3 of 9 Electronic Signature(s) Signed: 11/12/2022 3:13:02 PM By: [name] Guess MD FACS Entered By: [name] on 11/12/2022 15:13:02 -------------------------------------------------------------------------------- Physician Orders Details Patient Name: Date of Service: [name], [name] 11/12/2022 2:15 PM Medical Record Number: [id number] Patient Account Number: [id number] Date of Birth/Sex: Treating  RN: 11-12-1960 (62 y.o. Marlan Palau Primary Care Provider: Abbe Amsterdam Other Clinician: Referring Provider: Treating Provider/Extender: San Jetty in Treatment: 2 Verbal / Phone Orders: No Diagnosis Coding ICD-10 Coding Code Description [id number] Non-pressure chronic ulcer of other part of left lower leg with fat layer exposed I10 Essential (primary) hypertension R60.0 Localized edema E66.01 Morbid (severe) obesity due to excess calories Follow-up Appointments ppointment in 1 week. - Dr. Lady Gary Rm 4 Return A Anesthetic Wound #1 Left,Anterior Lower Leg (In clinic) Topical Lidocaine 5% applied to wound bed - Prior to debridement Bathing/ Shower/ Hygiene May shower with protection but do not get wound dressing(s) wet. Protect dressing(s) with water repellant cover (for example, large plastic bag) or a cast cover and may then take shower. - May purchase cast protector from [facility] or [facility]. The dressing needs to stay dry at all times. If the dressing gets wet, please reach out to [place] and we will make an appt to have the dressing changed. Edema Control - Lymphedema / SCD / Other Left Lower Extremity Elevate legs to the level of the heart or above for 30 minutes daily and/or when sitting for 3-4 times a day throughout the day. Avoid standing for long periods of time. Exercise regularly Moisturize legs daily. Wound Treatment Wound #1 - Lower Leg Wound Laterality: Left, Anterior Cleanser: Soap and Water 1 x Per Week Discharge Instructions: May shower and wash wound with dial antibacterial soap and water prior to dressing change. Cleanser: Wound Cleanser 1 x Per Week Discharge Instructions: Cleanse the wound with wound cleanser prior to applying a clean dressing using gauze sponges, not tissue or cotton balls. Peri-Wound Care: Sween Lotion (Moisturizing lotion) 1 x Per Week Discharge Instructions: Apply moisturizing lotion as directed Topical:  Gentamicin 1 x Per Week Discharge Instructions: As directed by physician Prim Dressing: Promogran Prisma Matrix, 4.34 (sq in) (silver collagen) 1 x Per Week ary Discharge Instructions: Moisten collagen with saline or hydrogel Compression Wrap: FourPress (4 layer compression wrap) 1 x Per Week Discharge Instructions: Apply four layer compression as directed. May also use Miliken CoFlex 2 layer compression system as alternative. Patient Medications llergies: No Known Allergies A Cozad, Niquan R ([id number]) [id number] Page 4 of 9 Notifications Medication Indication Start End 11/12/2022 lidocaine DOSE topical 5 % ointment - ointment topical Electronic Signature(s) Signed: 11/12/2022 4:07:33 PM By: [name] Guess MD FACS Entered By:  Fredirick Maudlin on 11/12/2022 15:13:14 -------------------------------------------------------------------------------- Problem List Details Patient Name: Date of Service: [name] [name], [name] 11/12/2022 2:15 PM Medical Record Number: [id number] Patient Account Number: [id number] Date of Birth/Sex: Treating RN: 1960-10-20 (62 y.o. M) Primary Care Provider: [name] Other Clinician: Referring Provider: Treating Provider/Extender: [name] in Treatment: 2 Active Problems ICD-10 Encounter Code Description Active Date MDM Diagnosis [phone] Non-pressure chronic ulcer of other part of left lower leg with fat layer exposed1/15/2024 No Yes I10 Essential (primary) hypertension 10/29/2022 No Yes R60.0 Localized edema 10/29/2022 No Yes E66.01 Morbid (severe) obesity due to excess calories 10/29/2022 No Yes Inactive Problems Resolved Problems Electronic Signature(s) Signed: 11/12/2022 3:11:37 PM By: Fredirick Maudlin MD FACS Entered By: Fredirick Maudlin on 11/12/2022 15:11:37 -------------------------------------------------------------------------------- Progress Note Details Patient Name: Date  of Service: [name], [name] 11/12/2022 2:15 PM Medical Record Number: [id number] Patient Account Number: [id number] Date of Birth/Sex: Treating RN: 01/26/1961 (62 y.o. M) Primary Care Provider: [name] Other Clinician: Referring Provider: Treating Provider/Extender: [name] in Treatment: [name], [name] ([id number]) [id number] Page 5 of 9 Subjective Chief Complaint Information obtained from Patient Patient seen for complaints of Non-Healing Wound. History of Present Illness (HPI) ADMISSION 10/29/2022 This is a 62 year old man with a past medical history significant for obesity, history of DVT borderline diabetes, and gout. He apparently bumped his left mid , pretibial area on a plow in October 2023. he developed a wound in this area that simply has not healed. When he saw his P CP in November, a DVT scan was done that showed an acute thrombus in the gastrocnemius vein in the calf with chronic thrombus in the distal femoral and popliteal veins. He does have a history of prior DVT in this leg. A culture was taken that grew out Pseudomonas and he was prescribed levofloxacin, which has been continued since that time. The leg has been cultured multiple times with persistent isolation of Pseudomonas, heavy growth. The culture reports do indicate sensitivity to levofloxacin. Most recent hemoglobin A1c was 6.5. ABI in clinic today was 1.27. On the anterior tibial surface of the left leg, there is a small circular ulcer with thick yellow slough on the surface. The patient has 2+ pitting edema and discoloration consistent with stasis dermatitis. 11/05/2022: The wound was smaller by half this week. There is still some slough accumulation on the surface. Edema control is better. 11/12/2022: For some reason his wound measured larger today, although it does not appear to be so. There is some light slough on the surface. Patient  History Family History Heart Disease - Mother, Hypertension - Father, No family history of Cancer, Diabetes, Hereditary Spherocytosis, Kidney Disease, Lung Disease, Seizures, Stroke, Thyroid Problems, Tuberculosis. Social History Never smoker, Marital Status - Married, Alcohol Use - Never, Drug Use - No History, Caffeine Use - Never. Medical History Eyes Denies history of Cataracts, Optic Neuritis Ear/Nose/Mouth/Throat Denies history of Chronic sinus problems/congestion, Middle ear problems Hematologic/Lymphatic Denies history of Anemia, Hemophilia, Human Immunodeficiency Virus, Lymphedema, Sickle Cell Disease Respiratory Patient has history of Sleep Apnea Cardiovascular Patient has history of Hypertension Gastrointestinal Denies history of Cirrhosis , Colitis, Crohnoos, Hepatitis A, Hepatitis B, Hepatitis C Endocrine Denies history of Type I Diabetes, Type II Diabetes Immunological Denies history of Lupus Erythematosus, Raynaudoos, Scleroderma Integumentary (Skin) Denies history of History of Burn Musculoskeletal Patient has history of Gout - Rt knee Denies history of Rheumatoid Arthritis, Osteoarthritis, Osteomyelitis Neurologic Denies history of Dementia, Neuropathy, Quadriplegia, Paraplegia  Oncologic Denies history of Received Chemotherapy, Received Radiation Psychiatric Denies history of Anorexia/bulimia, Confinement Anxiety Medical A Surgical History Notes nd Respiratory lung nodule Genitourinary CKD Objective Constitutional no acute distress. Vitals Time Taken: 2:29 PM, Height: 73 in, Weight: 365 lbs, BMI: 48.2, Temperature: 98.6 F, Pulse: 73 bpm, Respiratory Rate: 18 breaths/min, Blood Pressure: 138/83 mmHg. Respiratory Normal work of breathing on room air. Grieder, RAYDEL LATSKO ([id number]) [id number] Page 6 of 9 General Notes: 11/12/2022: For some reason his wound measured larger today, although it does not appear to be so. There is  some light slough on the surface. Integumentary (Hair, Skin) Wound #1 status is Open. Original cause of wound was Trauma. The date acquired was: 07/09/2022. The wound has been in treatment 2 weeks. The wound is located on the Left,Anterior Lower Leg. The wound measures 0.8cm length x 1cm width x 0.1cm depth; 0.628cm^2 area and 0.063cm^3 volume. There is Fat Layer (Subcutaneous Tissue) exposed. There is no tunneling or undermining noted. There is a medium amount of serous drainage noted. The wound margin is distinct with the outline attached to the wound base. There is large (67-100%) pink granulation within the wound bed. There is a small (1-33%) amount of necrotic tissue within the wound bed including Adherent Slough. The periwound skin appearance exhibited: Scarring. The periwound skin appearance did not exhibit: Rash, Dry/Scaly, Maceration, Erythema. Periwound temperature was noted as No Abnormality. The periwound has tenderness on palpation. Assessment Active Problems ICD-10 Non-pressure chronic ulcer of other part of left lower leg with fat layer exposed Essential (primary) hypertension Localized edema Morbid (severe) obesity due to excess calories Procedures Wound #1 Pre-procedure diagnosis of Wound #1 is a Venous Leg Ulcer located on the Left,Anterior Lower Leg .Severity of Tissue Pre Debridement is: Fat layer exposed. There was a Excisional Skin/Subcutaneous Tissue Debridement with a total area of 0.8 sq cm performed by Fredirick Maudlin, MD. With the following instrument(s): Curette to remove Non-Viable tissue/material. Material removed includes Subcutaneous Tissue and Slough and after achieving pain control using Lidocaine 5% topical ointment. No specimens were taken. A time out was conducted at 14:40, prior to the start of the procedure. A Minimum amount of bleeding was controlled with Pressure. The procedure was tolerated Cross. Post Debridement Measurements: 0.8cm length x 1cm width x  0.1cm depth; 0.063cm^3 volume. Character of Wound/Ulcer Post Debridement is improved. Severity of Tissue Post Debridement is: Fat layer exposed. Post procedure Diagnosis Wound #1: Same as Pre-Procedure General Notes: scribed for Dr. Celine Ahr by Adline Peals, RN. Pre-procedure diagnosis of Wound #1 is a Venous Leg Ulcer located on the Left,Anterior Lower Leg . There was a Four Layer Compression Therapy Procedure by Adline Peals, RN. Post procedure Diagnosis Wound #1: Same as Pre-Procedure Plan Follow-up Appointments: Return Appointment in 1 week. - Dr. Celine Ahr Rm 4 Anesthetic: Wound #1 Left,Anterior Lower Leg: (In clinic) Topical Lidocaine 5% applied to wound bed - Prior to debridement Bathing/ Shower/ Hygiene: May shower with protection but do not get wound dressing(s) wet. Protect dressing(s) with water repellant cover (for example, large plastic bag) or a cast cover and may then take shower. - May purchase cast protector from [facility] or [facility]. The dressing needs to stay dry at all times. If the dressing gets wet, please reach out to [place] and we will make an appt to have the dressing changed. Edema Control - Lymphedema / SCD / Other: Elevate legs to the level of the heart or above for 30 minutes daily and/or when sitting for 3-4  times a day throughout the day. Avoid standing for long periods of time. Exercise regularly Moisturize legs daily. The following medication(s) was prescribed: lidocaine topical 5 % ointment ointment topical was prescribed at facility WOUND #1: - Lower Leg Wound Laterality: Left, Anterior Cleanser: Soap and Water 1 x Per Week/ Discharge Instructions: May shower and wash wound with dial antibacterial soap and water prior to dressing change. Cleanser: Wound Cleanser 1 x Per Week/ Discharge Instructions: Cleanse the wound with wound cleanser prior to applying a clean dressing using gauze sponges, not tissue or cotton balls. Peri-Wound Care: Sween Lotion  (Moisturizing lotion) 1 x Per Week/ Discharge Instructions: Apply moisturizing lotion as directed Topical: Gentamicin 1 x Per Week/ Discharge Instructions: As directed by physician Prim Dressing: Promogran Prisma Matrix, 4.34 (sq in) (silver collagen) 1 x Per Week/ ary Discharge Instructions: Moisten collagen with saline or hydrogel Com pression Wrap: FourPress (4 layer compression wrap) 1 x Per Week/ Discharge Instructions: Apply four layer compression as directed. May also use Miliken CoFlex 2 layer compression system as alternative. 11/12/2022: For some reason his wound measured larger today, although it does not appear to be so. There is some light slough on the surface. Trant, NEVEN FINA ([id number]) [id number] Page 7 of 9 I used a curette to debride slough and nonviable subcutaneous tissue from the wound. I am going to change his contact layer to Prisma silver collagen to try and encourage ingrowth of epithelium. I am also going to increase his compression from 3 layer to 4-layer to try and improve his edema control. Follow-up in 1 week. Electronic Signature(s) Signed: 11/12/2022 3:14:09 PM By: Fredirick Maudlin MD FACS Entered By: Fredirick Maudlin on 11/12/2022 15:14:09 -------------------------------------------------------------------------------- HxROS Details Patient Name: Date of Service: [name], [name] 11/12/2022 2:15 PM Medical Record Number: [id number] Patient Account Number: [id number] Date of Birth/Sex: Treating RN: [birth year] (62 y.o. M) Primary Care Provider: [name] Other Clinician: Referring Provider: Treating Provider/Extender: [name] in Treatment: 2 Eyes Medical History: Negative for: Cataracts; Optic Neuritis Ear/Nose/Mouth/Throat Medical History: Negative for: Chronic sinus problems/congestion; Middle ear problems Hematologic/Lymphatic Medical History: Negative for: Anemia; Hemophilia;  Human Immunodeficiency Virus; Lymphedema; Sickle Cell Disease Respiratory Medical History: Positive for: Sleep Apnea Past Medical History Notes: lung nodule Cardiovascular Medical History: Positive for: Hypertension Gastrointestinal Medical History: Negative for: Cirrhosis ; Colitis; Crohns; Hepatitis A; Hepatitis B; Hepatitis C Endocrine Medical History: Negative for: Type I Diabetes; Type II Diabetes Genitourinary Medical History: Past Medical History Notes: CKD Immunological Medical History: Negative for: Lupus Erythematosus; Raynauds; Scleroderma Integumentary (Skin) [name], [name] R ([id number]) [id number] Page 8 of 9 Medical History: Negative for: History of Burn Musculoskeletal Medical History: Positive for: Gout - Rt knee Negative for: Rheumatoid Arthritis; Osteoarthritis; Osteomyelitis Neurologic Medical History: Negative for: Dementia; Neuropathy; Quadriplegia; Paraplegia Oncologic Medical History: Negative for: Received Chemotherapy; Received Radiation Psychiatric Medical History: Negative for: Anorexia/bulimia; Confinement Anxiety Immunizations Pneumococcal Vaccine: Received Pneumococcal Vaccination: No Implantable Devices None Family and Social History Cancer: No; Diabetes: No; Heart Disease: Yes - Mother; Hereditary Spherocytosis: No; Hypertension: Yes - Father; Kidney Disease: No; Lung Disease: No; Seizures: No; Stroke: No; Thyroid Problems: No; Tuberculosis: No; Never smoker; Marital Status - Married; Alcohol Use: Never; Drug Use: No History; Caffeine Use: Never; Financial Concerns: No; Food, Clothing or Shelter Needs: No; Support System Lacking: No; Transportation Concerns: No Electronic Signature(s) Signed: 11/12/2022 4:07:33 PM By: Fredirick Maudlin MD FACS Entered By: Fredirick Maudlin on 11/12/2022 15:12:39 -------------------------------------------------------------------------------- SuperBill Details Patient Name:  Date of Service:  [name] [name], [name] 11/12/2022 Medical Record Number: [id number] Patient Account Number: [id number] Date of Birth/Sex: Treating RN: 1961-09-14 (62 y.o. M) Primary Care Provider: [name] Other Clinician: Referring Provider: Treating Provider/Extender: [name] in Treatment: 2 Diagnosis Coding ICD-10 Codes Code Description [phone] Non-pressure chronic ulcer of other part of left lower leg with fat layer exposed I10 Essential (primary) hypertension R60.0 Localized edema E66.01 Morbid (severe) obesity due to excess calories Facility Procedures : Barney, CPT4 Code: [id number] Abel R ([id number] Description: 11042 - DEB SUBQ TISSUE 20 SQ CM/< ICD-10 Diagnosis Description L97.822 Non-pressure chronic ulcer of other part of left lower leg with fat layer expo 0) [phone] Modifier: sed [id number] Quantity: 1 227.pdf Page 9 of 9 Physician Procedures : CPT4 Code Description Modifier [id number] - WC PHYS LEVEL 3 - EST PT 25 ICD-10 Diagnosis Description L97.822 Non-pressure chronic ulcer of other part of left lower leg with fat layer exposed R60.0 Localized edema I10 Essential (primary) hypertension  E66.01 Morbid (severe) obesity due to excess calories Quantity: 1 : [id number] 11042 - WC PHYS SUBQ TISS 20 SQ CM ICD-10 Diagnosis Description L97.822 Non-pressure chronic ulcer of other part of left lower leg with fat layer exposed Quantity: 1 Electronic Signature(s) Signed: 11/12/2022 3:14:27 PM By: Fredirick Maudlin MD FACS Entered By: Fredirick Maudlin on 11/12/2022 15:14:27

## 2022-11-12 NOTE — Progress Notes (Signed)
Cross, [name] [facility] ([id number]) [id number] Page 1 of 8 Visit Report for 11/12/2022 Arrival Information Details Patient Name: Date of Service: [name], [name] 11/12/2022 2:15 PM Medical Record Number: [id number] Patient Account Number: [id number] Date of Birth/Sex: Treating RN: 1961/02/05 (62 y.o. [name] Primary Care [name]: [name] Other Clinician: Referring [name]: Treating [name]/Extender: [name] in Treatment: 2 Visit Information History Since Last Visit Added or deleted any medications: No Patient Arrived: Ambulatory Any new allergies or adverse reactions: No Arrival Time: 14:25 Had a fall or experienced change in No Accompanied By: self activities of daily living that may affect Transfer Assistance: None risk of falls: Patient Identification Verified: Yes Signs or symptoms of abuse/neglect since last visito No Secondary Verification Process Completed: Yes Hospitalized since last visit: No Patient Requires Transmission-Based Precautions: No Implantable device outside of the clinic excluding No Patient Has Alerts: Yes cellular tissue based products placed in the center Patient Alerts: Patient on Blood Thinner since last visit: Has Dressing in Place as Prescribed: Yes Has Compression in Place as Prescribed: Yes Pain Present Now: No Electronic Signature(s) Signed: 11/12/2022 4:46:33 PM By: Adline Peals Entered By: Adline Peals on 11/12/2022 14:29:06 -------------------------------------------------------------------------------- Compression Therapy Details Patient Name: Date of Service: [name], [name] 11/12/2022 2:15 PM Medical Record Number: [id number] Patient Account Number: [id number] Date of Birth/Sex: Treating RN: 09-Jan-1961 (62 y.o. [name] Primary Care [name]: [name] Other Clinician: Referring Rosealyn Little: Treating [name]/Extender:  [name] in Treatment: 2 Compression Therapy Performed for Wound Assessment: Wound #1 Left,Anterior Lower Leg Performed By: Clinician Adline Peals, RN Compression Type: Four Layer Post Procedure Diagnosis Same as Pre-procedure Electronic Signature(s) Signed: 11/12/2022 4:46:33 PM By: Sabas Sous By: Adline Peals on 11/12/2022 15:00:40 Cross, [name] ([id number]) [id number] Page 2 of 8 -------------------------------------------------------------------------------- Encounter Discharge Information Details Patient Name: Date of Service: [name], [name] 11/12/2022 2:15 PM Medical Record Number: [id number] Patient Account Number: [id number] Date of Birth/Sex: Treating RN: 1961-08-18 (62 y.o. [name] Primary Care [name]: [name] Other Clinician: Referring [name]: Treating [name]/Extender: [name] in Treatment: 2 Encounter Discharge Information Items Post Procedure Vitals Discharge Condition: Stable Temperature (F): 98.6 Ambulatory Status: Ambulatory Pulse (bpm): 73 Discharge Destination: Home Respiratory Rate (breaths/min): 18 Transportation: Private Auto Blood Pressure (mmHg): 138/83 Accompanied By: self Schedule Follow-up Appointment: Yes Clinical Summary of Care: Patient Declined Electronic Signature(s) Signed: 11/12/2022 4:46:33 PM By: Adline Peals Entered By: Adline Peals on 11/12/2022 15:01:11 -------------------------------------------------------------------------------- Lower Extremity Assessment Details Patient Name: Date of Service: [name] [name] 11/12/2022 2:15 PM Medical Record Number: [id number] Patient Account Number: [id number] Date of Birth/Sex: Treating RN: [birth year] (62 y.o. [name] Primary Care [name]: [name] Other Clinician: Referring [name]: Treating  [name]/Extender: [name] in Treatment: 2 Edema Assessment Assessed: [name]: No] [Right: No] [Left: Edema] [Right: :] Calf Left: Right: Point of Measurement: From Medial Instep 50 cm Ankle Left: Right: Point of Measurement: From Medial Instep 30.5 cm Vascular Assessment Pulses: Dorsalis Pedis Palpable: [Left:Yes] Electronic Signature(s) Signed: 11/12/2022 4:46:33 PM By: Adline Peals Entered By: Adline Peals on 11/12/2022 14:34:10 Multi Wound Chart Details -------------------------------------------------------------------------------- Cross, [name] ([id number]) [id number] Page 3 of 8 Patient Name: Date of Service: [name] [name], Cross 11/12/2022 2:15 PM Medical Record Number: [id number] Patient Account Number: [id number] Date of Birth/Sex: Treating RN: 1961/06/01 (62 y.o. M) Primary Care [name]: [name] Other Clinician: Referring [name]: Treating [name]/Extender: [name]  Weeks in Treatment: 2 Vital Signs Height(in): 73 Pulse(bpm): 73 Weight(lbs): 365 Blood Pressure(mmHg): 138/83 Body Mass Index(BMI): 48.2 Temperature(F): 98.6 Respiratory Rate(breaths/min): 18 Wound Assessments Wound Number: 1 N/A N/A Photos: N/A N/A Left, Anterior Lower Leg N/A N/A Wound Location: Trauma N/A N/A Wounding Event: Venous Leg Ulcer N/A N/A Primary Etiology: Sleep Apnea, Hypertension, Gout N/A N/A Comorbid History: 07/09/2022 N/A N/A Date Acquired: 2 N/A N/A Weeks of Treatment: Open N/A N/A Wound Status: No N/A N/A Wound Recurrence: 0.8x1x0.1 N/A N/A Measurements L x W x D (cm) 0.628 N/A N/A A (cm) : rea 0.063 N/A N/A Volume (cm) : -48.10% N/A N/A % Reduction in A rea: -50.00% N/A N/A % Reduction in Volume: Full Thickness Without Exposed N/A N/A Classification: Support Structures Medium N/A N/A Exudate A mount: Serous N/A N/A Exudate Type: amber N/A  N/A Exudate Color: Distinct, outline attached N/A N/A Wound Margin: Large (67-100%) N/A N/A Granulation A mount: Pink N/A N/A Granulation Quality: Small (1-33%) N/A N/A Necrotic A mount: Fat Layer (Subcutaneous Tissue): Yes N/A N/A Exposed Structures: Fascia: No Tendon: No Muscle: No Joint: No Bone: No Small (1-33%) N/A N/A Epithelialization: Debridement - Excisional N/A N/A Debridement: Pre-procedure Verification/Time Out 14:40 N/A N/A Taken: Lidocaine 5% topical ointment N/A N/A Pain Control: Subcutaneous, Slough N/A N/A Tissue Debrided: Skin/Subcutaneous Tissue N/A N/A Level: 0.8 N/A N/A Debridement A (sq cm): rea Curette N/A N/A Instrument: Minimum N/A N/A Bleeding: Pressure N/A N/A Hemostasis A chieved: Procedure was tolerated Cross N/A N/A Debridement Treatment Response: 0.8x1x0.1 N/A N/A Post Debridement Measurements L x W x D (cm) 0.063 N/A N/A Post Debridement Volume: (cm) Scarring: Yes N/A N/A Periwound Skin Texture: Rash: No Maceration: No N/A N/A Periwound Skin Moisture: Dry/Scaly: No Erythema: No N/A N/A Periwound Skin Color: No Abnormality N/A N/A Temperature: Yes N/A N/A Tenderness on Palpation: Compression Therapy N/A N/A Procedures Performed: Debridement Treatment Notes Cross, [name] R ([id number]) [id number] Page 4 of 8 Wound #1 (Lower Leg) Wound Laterality: Left, Anterior Cleanser Soap and Water Discharge Instruction: May shower and wash wound with dial antibacterial soap and water prior to dressing change. Wound Cleanser Discharge Instruction: Cleanse the wound with wound cleanser prior to applying a clean dressing using gauze sponges, not tissue or cotton balls. Peri-Wound Care Sween Lotion (Moisturizing lotion) Discharge Instruction: Apply moisturizing lotion as directed Topical Gentamicin Discharge Instruction: As directed by physician Primary Dressing Promogran Prisma Matrix, 4.34 (sq in)  (silver collagen) Discharge Instruction: Moisten collagen with saline or hydrogel Secondary Dressing Secured With Compression Wrap FourPress (4 layer compression wrap) Discharge Instruction: Apply four layer compression as directed. May also use Miliken CoFlex 2 layer compression system as alternative. Compression Stockings Add-Ons Electronic Signature(s) Signed: 11/12/2022 3:11:45 PM By: Duanne Guess MD FACS Entered By: Duanne Guess on 11/12/2022 15:11:45 -------------------------------------------------------------------------------- Multi-Disciplinary Care Plan Details Patient Name: Date of Service: [name], [name] 11/12/2022 2:15 PM Medical Record Number: [id number] Patient Account Number: [id number] Date of Birth/Sex: Treating RN: [birth year] (62 y.o. [name] Primary Care Infant Doane: Abbe [place] Other Clinician: Referring Marcelle Hepner: Treating Marieke Lubke/Extender: San Jetty in Treatment: 2 Active Inactive Nutrition Nursing Diagnoses: Imbalanced nutrition Goals: Patient/caregiver agrees to and verbalizes understanding of need to use nutritional supplements and/or vitamins as prescribed Date Initiated: 10/29/2022 Target Resolution Date: 12/13/2022 Goal Status: Active Interventions: Provide education on nutrition Treatment Activities: Dietary management education, guidance and counseling : 10/29/2022 Giving encouragement to exercise : 10/29/2022 Notes: Kinslow, Blessing R ([id number]) [id number] Page 5 of 8 Orientation to the  Wound Care Program Nursing Diagnoses: Knowledge deficit related to the wound healing center program Goals: Patient/caregiver will verbalize understanding of the Wound Healing Center Program Date Initiated: 10/29/2022 Target Resolution Date: 01/17/2023 Goal Status: Active Interventions: Provide education on orientation to the wound center Notes: Wound/Skin Impairment Nursing  Diagnoses: Impaired tissue integrity Goals: Ulcer/skin breakdown will have a volume reduction of 30% by week 4 Date Initiated: 10/29/2022 Target Resolution Date: 12/10/2022 Goal Status: Active Interventions: Assess ulceration(s) every visit Provide education on ulcer and skin care Treatment Activities: Skin care regimen initiated : 10/29/2022 Topical wound management initiated : 10/29/2022 Notes: Electronic Signature(s) Signed: 11/12/2022 4:46:33 PM By: Samuella Bruin Entered By: Samuella Bruin on 11/12/2022 14:40:10 -------------------------------------------------------------------------------- Pain Assessment Details Patient Name: Date of Service: [name] RAYMON, SCHLARB 11/12/2022 2:15 PM Medical Record Number: [id number] Patient Account Number: [id number] Date of Birth/Sex: Treating RN: 11/01/1960 (62 y.o. [name] Primary Care Lavene Penagos: Abbe [place] Other Clinician: Referring Bilan Tedesco: Treating Jibril Mcminn/Extender: San Jetty in Treatment: 2 Active Problems Location of Pain Severity and Description of Pain Patient Has Paino No Site Locations Rate the pain. Cross, [name] WHOLEY ([id number]) [id number] Page 6 of 8 Rate the pain. Current Pain Level: 0 Pain Management and Medication Current Pain Management: Electronic Signature(s) Signed: 11/12/2022 4:46:33 PM By: Samuella Bruin Entered By: Samuella Bruin on 11/12/2022 14:29:46 -------------------------------------------------------------------------------- Patient/Caregiver Education Details Patient Name: Date of Service: [name] [name] Dolin 1/29/2024andnbsp2:15 PM Medical Record Number: [id number] Patient Account Number: [id number] Date of Birth/Gender: Treating RN: 09-Feb-1961 (62 y.o. [name] Primary Care Physician: Abbe Amsterdam Other Clinician: Referring Physician: Treating Physician/Extender: San Jetty in Treatment: 2 Education Assessment Education Provided To: Patient Education Topics Provided Wound Debridement: Methods: Explain/Verbal Responses: Reinforcements needed, State content correctly Electronic Signature(s) Signed: 11/12/2022 4:46:33 PM By: Samuella Bruin Entered By: Samuella Bruin on 11/12/2022 14:40:33 -------------------------------------------------------------------------------- Wound Assessment Details Patient Name: Date of Service: [name] TALLY, MCKINNON 11/12/2022 2:15 PM Medical Record Number: [id number] Patient Account Number: [id number] Date of Birth/Sex: Treating RN: Feb 20, 1961 (62 y.o. [name] Primary Care Arlando Leisinger: Abbe [place] Other Clinician: Referring Doil Kamara: Treating Willmar Stockinger/Extender: Henderson Newcomer Chenette, [place] R ([id number]) [id number] Page 7 of 8 Weeks in Treatment: 2 Wound Status Wound Number: 1 Primary Etiology: Venous Leg Ulcer Wound Location: Left, Anterior Lower Leg Wound Status: Open Wounding Event: Trauma Comorbid History: Sleep Apnea, Hypertension, Gout Date Acquired: 07/09/2022 Weeks Of Treatment: 2 Clustered Wound: No Photos Wound Measurements Length: (cm) 0.8 Width: (cm) 1 Depth: (cm) 0.1 Area: (cm) 0.628 Volume: (cm) 0.063 % Reduction in Area: -48.1% % Reduction in Volume: -50% Epithelialization: Small (1-33%) Tunneling: No Undermining: No Wound Description Classification: Full Thickness Without Exposed Support Structures Wound Margin: Distinct, outline attached Exudate Amount: Medium Exudate Type: Serous Exudate Color: amber Foul Odor After Cleansing: No Slough/Fibrino Yes Wound Bed Granulation Amount: Large (67-100%) Exposed Structure Granulation Quality: Pink Fascia Exposed: No Necrotic Amount: Small (1-33%) Fat Layer (Subcutaneous Tissue) Exposed: Yes Necrotic Quality: Adherent Slough Tendon Exposed: No Muscle Exposed:  No Joint Exposed: No Bone Exposed: No Periwound Skin Texture Texture Color No Abnormalities Noted: No No Abnormalities Noted: No Rash: No Erythema: No Scarring: Yes Temperature / Pain Temperature: No Abnormality Moisture No Abnormalities Noted: No Tenderness on Palpation: Yes Dry / Scaly: No Maceration: No Treatment Notes Wound #1 (Lower Leg) Wound Laterality: Left, Anterior Cleanser Soap and Water Discharge Instruction: May shower and wash wound with dial antibacterial soap and water prior to dressing change. Wound Cleanser  Discharge Instruction: Cleanse the wound with wound cleanser prior to applying a clean dressing using gauze sponges, not tissue or cotton balls. Peri-Wound Care Sween Lotion (Moisturizing lotion) Discharge Instruction: Apply moisturizing lotion as directed Topical Raz, Jaron R ([id number]) [id number] Page 8 of 8 Gentamicin Discharge Instruction: As directed by physician Primary Dressing Promogran Prisma Matrix, 4.34 (sq in) (silver collagen) Discharge Instruction: Moisten collagen with saline or hydrogel Secondary Dressing Secured With Compression Wrap FourPress (4 layer compression wrap) Discharge Instruction: Apply four layer compression as directed. May also use Miliken CoFlex 2 layer compression system as alternative. Compression Stockings Add-Ons Electronic Signature(s) Signed: 11/12/2022 4:46:33 PM By: Adline Peals Entered By: Adline Peals on 11/12/2022 14:36:20 -------------------------------------------------------------------------------- Vitals Details Patient Name: Date of Service: Hartwell, Revis R. 11/12/2022 2:15 PM Medical Record Number: [id number] Patient Account Number: [id number] Date of Birth/Sex: Treating RN: [birth year] (62 y.o. [name] Primary Care Kiona Blume: [name] Other Clinician: Referring Heloise Gordan: Treating Tru Rana/Extender: [name] in Treatment: 2 Vital Signs Time Taken: 14:29 Temperature (F): 98.6 Height (in): 73 Pulse (bpm): 73 Weight (lbs): 365 Respiratory Rate (breaths/min): 18 Body Mass Index (BMI): 48.2 Blood Pressure (mmHg): 138/83 Reference Range: 80 - 120 mg / dl Electronic Signature(s) Signed: 11/12/2022 4:46:33 PM By: Adline Peals Entered By: Adline Peals on 11/12/2022 14:29:40

## 2022-11-19 ENCOUNTER — Ambulatory Visit (HOSPITAL_BASED_OUTPATIENT_CLINIC_OR_DEPARTMENT_OTHER): Payer: BC Managed Care – PPO | Admitting: General Surgery

## 2022-11-20 ENCOUNTER — Encounter (HOSPITAL_BASED_OUTPATIENT_CLINIC_OR_DEPARTMENT_OTHER): Payer: BC Managed Care – PPO | Attending: General Surgery | Admitting: General Surgery

## 2022-11-20 DIAGNOSIS — G4730 Sleep apnea, unspecified: Secondary | ICD-10-CM | POA: Insufficient documentation

## 2022-11-20 DIAGNOSIS — I10 Essential (primary) hypertension: Secondary | ICD-10-CM | POA: Diagnosis not present

## 2022-11-20 DIAGNOSIS — L97822 Non-pressure chronic ulcer of other part of left lower leg with fat layer exposed: Secondary | ICD-10-CM | POA: Diagnosis present

## 2022-11-20 DIAGNOSIS — I890 Lymphedema, not elsewhere classified: Secondary | ICD-10-CM | POA: Insufficient documentation

## 2022-11-20 NOTE — Progress Notes (Signed)
[name] ([id number]) [id number] Page 1 of 8 Visit Report for 11/20/2022 Chief Complaint Document Details Patient Name: Date of Service: MCDO [name] 11/20/2022 1:30 PM Medical Record Number: [id number] Patient Account Number: [id number] Date of Birth/Sex: Treating RN: August 28, 1961 (62 y.o. M) Primary Care Provider: [name] Other Clinician: Referring Provider: Treating Provider/Extender: [name] in Treatment: 3 Information Obtained from: Patient Chief Complaint Patient seen for complaints of Non-Healing Wound. Electronic Signature(s) Signed: 11/20/2022 1:49:38 PM By: Fredirick Maudlin MD FACS Entered By: Fredirick Maudlin on 11/20/2022 13:49:38 -------------------------------------------------------------------------------- HPI Details Patient Name: Date of Service: MCDO WELL, [name] 11/20/2022 1:30 PM Medical Record Number: [id number] Patient Account Number: [id number] Date of Birth/Sex: Treating RN: December 04, 1960 (62 y.o. M) Primary Care Provider: [name] Other Clinician: Referring Provider: Treating Provider/Extender: [name] in Treatment: 3 History of Present Illness HPI Description: ADMISSION 10/29/2022 This is a 62 year old man with a past medical history significant for obesity, history of DVT borderline diabetes, and gout. He apparently bumped his left mid , pretibial area on a plow in October 2023. he developed a wound in this area that simply has not healed. When he saw his P CP in November, a DVT scan was done that showed an acute thrombus in the gastrocnemius vein in the calf with chronic thrombus in the distal femoral and popliteal veins. He does have a history of prior DVT in this leg. A culture was taken that grew out Pseudomonas and he was prescribed levofloxacin, which has been continued since that time. The leg has been cultured multiple times with  persistent isolation of Pseudomonas, heavy growth. The culture reports do indicate sensitivity to levofloxacin. Most recent hemoglobin A1c was 6.5. ABI in clinic today was 1.27. On the anterior tibial surface of the left leg, there is a small circular ulcer with thick yellow slough on the surface. The patient has 2+ pitting edema and discoloration consistent with stasis dermatitis. 11/05/2022: The wound was smaller by half this week. There is still some slough accumulation on the surface. Edema control is better. 11/12/2022: For some reason his wound measured larger today, although it does not appear to be so. There is some light slough on the surface. 11/20/2022: The wound is little bit smaller today. There is epithelium encroaching from the perimeter. It is extremely clean without any slough or debris accumulation. Electronic Signature(s) Signed: 11/20/2022 1:50:09 PM By: Fredirick Maudlin MD FACS Entered By: Fredirick Maudlin on 11/20/2022 13:50:09 [name], [name] ([id number]) [id number] Page 2 of 8 -------------------------------------------------------------------------------- Physical Exam Details Patient Name: Date of Service: MCDO [name] 11/20/2022 1:30 PM Medical Record Number: [id number] Patient Account Number: [id number] Date of Birth/Sex: Treating RN: 1960/10/19 (62 y.o. M) Primary Care Provider: [name] Other Clinician: Referring Provider: Treating Provider/Extender: [name] in Treatment: 3 Constitutional Slightly hypertensive. . . . no acute distress. Respiratory Normal work of breathing on room air. Notes 11/20/2022: The wound is little bit smaller today. There is epithelium encroaching from the perimeter. It is extremely clean without any slough or debris accumulation. Electronic Signature(s) Signed: 11/20/2022 1:51:07 PM By: Fredirick Maudlin MD FACS Entered By: Fredirick Maudlin on 11/20/2022  13:51:06 -------------------------------------------------------------------------------- Physician Orders Details Patient Name: Date of Service: MCDO WELL, [name] 11/20/2022 1:30 PM Medical Record Number: [id number] Patient Account Number: [id number] Date of Birth/Sex: Treating RN: 1960/11/26 (62 y.o. Ernestene Mention Primary Care Provider: [name] Other Clinician: Referring Provider: Treating Provider/Extender: [name]  Weeks in Treatment: 3 Verbal / Phone Orders: No Diagnosis Coding ICD-10 Coding Code Description [phone] Non-pressure chronic ulcer of other part of left lower leg with fat layer exposed I10 Essential (primary) hypertension R60.0 Localized edema E66.01 Morbid (severe) obesity due to excess calories Follow-up Appointments ppointment in 1 week. - Dr. Lady Gary Rm 3 Return A Tuesday 2/13 @ 3:00 pm Anesthetic Wound #1 Left,Anterior Lower Leg (In clinic) Topical Lidocaine 4% applied to wound bed Bathing/ Shower/ Hygiene May shower with protection but do not get wound dressing(s) wet. Protect dressing(s) with water repellant cover (for example, large plastic bag) or a cast cover and may then take shower. - May purchase cast protector from [facility] or [facility]. The dressing needs to stay dry at all times. If the dressing gets wet, please reach out to [place] and we will make an appt to have the dressing changed. Edema Control - Lymphedema / SCD / Other Left Lower Extremity Elevate legs to the level of the heart or above for 30 minutes daily and/or when sitting for 3-4 times a day throughout the day. Avoid standing for long periods of time. Exercise regularly Londo, Daiel R ([id number]) [id number] Page 3 of 8 Moisturize legs daily. Wound Treatment Wound #1 - Lower Leg Wound Laterality: Left, Anterior Cleanser: Soap and Water 1 x Per Week Discharge Instructions: May shower and wash wound with dial antibacterial  soap and water prior to dressing change. Cleanser: Wound Cleanser 1 x Per Week Discharge Instructions: Cleanse the wound with wound cleanser prior to applying a clean dressing using gauze sponges, not tissue or cotton balls. Peri-Wound Care: Sween Lotion (Moisturizing lotion) 1 x Per Week Discharge Instructions: Apply moisturizing lotion as directed Topical: Gentamicin 1 x Per Week Discharge Instructions: As directed by physician Prim Dressing: Promogran Prisma Matrix, 4.34 (sq in) (silver collagen) 1 x Per Week ary Discharge Instructions: Moisten collagen with saline or hydrogel Secondary Dressing: Woven Gauze Sponge, Non-Sterile 4x4 in 1 x Per Week Discharge Instructions: Apply over primary dressing as directed. Compression Wrap: FourPress (4 layer compression wrap) 1 x Per Week Discharge Instructions: Apply four layer compression as directed. May also use URGO 2 layer compression system as alternative. Electronic Signature(s) Signed: 11/20/2022 3:13:36 PM By: Duanne Guess MD FACS Signed: 11/20/2022 5:05:54 PM By: Zenaida Deed RN, BSN Previous Signature: 11/20/2022 1:51:14 PM Version By: Duanne Guess MD FACS Entered By: Zenaida Deed on 11/20/2022 13:52:12 -------------------------------------------------------------------------------- Problem List Details Patient Name: Date of Service: MCDO WELL, [name] 11/20/2022 1:30 PM Medical Record Number: [id number] Patient Account Number: [id number] Date of Birth/Sex: Treating RN: Nov 02, 1960 (61 y.o. Damaris Schooner Primary Care Provider: Abbe Amsterdam Other Clinician: Referring Provider: Treating Provider/Extender: San Jetty in Treatment: 3 Active Problems ICD-10 Encounter Code Description Active Date MDM Diagnosis [phone] Non-pressure chronic ulcer of other part of left lower leg with fat layer exposed1/15/2024 No Yes I10 Essential (primary) hypertension 10/29/2022 No Yes R60.0 Localized edema  10/29/2022 No Yes E66.01 Morbid (severe) obesity due to excess calories 10/29/2022 No Yes Inactive Problems Resolved Problems Chenette, Elihu R ([id number]) [id number] Page 4 of 8 Electronic Signature(s) Signed: 11/20/2022 1:49:23 PM By: Duanne Guess MD FACS Previous Signature: 11/20/2022 1:45:10 PM Version By: Duanne Guess MD FACS Entered By: Duanne Guess on 11/20/2022 13:49:23 -------------------------------------------------------------------------------- Progress Note Details Patient Name: Date of Service: MCDO WELL, [name] 11/20/2022 1:30 PM Medical Record Number: [id number] Patient Account Number: [id number] Date of Birth/Sex: Treating RN: 10-18-1960 (62 y.o. M) Primary Care Provider: Abbe Amsterdam Other  Clinician: Referring Provider: Treating Provider/Extender: San Jetty in Treatment: 3 Subjective Chief Complaint Information obtained from Patient Patient seen for complaints of Non-Healing Wound. History of Present Illness (HPI) ADMISSION 10/29/2022 This is a 62 year old man with a past medical history significant for obesity, history of DVT borderline diabetes, and gout. He apparently bumped his left mid , pretibial area on a plow in October 2023. he developed a wound in this area that simply has not healed. When he saw his P CP in November, a DVT scan was done that showed an acute thrombus in the gastrocnemius vein in the calf with chronic thrombus in the distal femoral and popliteal veins. He does have a history of prior DVT in this leg. A culture was taken that grew out Pseudomonas and he was prescribed levofloxacin, which has been continued since that time. The leg has been cultured multiple times with persistent isolation of Pseudomonas, heavy growth. The culture reports do indicate sensitivity to levofloxacin. Most recent hemoglobin A1c was 6.5. ABI in clinic today was 1.27. On the anterior tibial surface  of the left leg, there is a small circular ulcer with thick yellow slough on the surface. The patient has 2+ pitting edema and discoloration consistent with stasis dermatitis. 11/05/2022: The wound was smaller by half this week. There is still some slough accumulation on the surface. Edema control is better. 11/12/2022: For some reason his wound measured larger today, although it does not appear to be so. There is some light slough on the surface. 11/20/2022: The wound is little bit smaller today. There is epithelium encroaching from the perimeter. It is extremely clean without any slough or debris accumulation. Patient History Family History Heart Disease - Mother, Hypertension - Father, No family history of Cancer, Diabetes, Hereditary Spherocytosis, Kidney Disease, Lung Disease, Seizures, Stroke, Thyroid Problems, Tuberculosis. Social History Never smoker, Marital Status - Married, Alcohol Use - Never, Drug Use - No History, Caffeine Use - Never. Medical History Eyes Denies history of Cataracts, Optic Neuritis Ear/Nose/Mouth/Throat Denies history of Chronic sinus problems/congestion, Middle ear problems Hematologic/Lymphatic Denies history of Anemia, Hemophilia, Human Immunodeficiency Virus, Lymphedema, Sickle Cell Disease Respiratory Patient has history of Sleep Apnea Cardiovascular Patient has history of Hypertension Gastrointestinal Denies history of Cirrhosis , Colitis, Crohnoos, Hepatitis A, Hepatitis B, Hepatitis C Endocrine Denies history of Type I Diabetes, Type II Diabetes Immunological Denies history of Lupus Erythematosus, Raynaudoos, Scleroderma Integumentary (Skin) Denies history of History of Burn Musculoskeletal Patient has history of Gout - Rt knee Denies history of Rheumatoid Arthritis, Osteoarthritis, Osteomyelitis Neurologic Denies history of Dementia, Neuropathy, Quadriplegia, Paraplegia Oncologic Denies history of Received Chemotherapy, Received  Radiation Psychiatric Denies history of Anorexia/bulimia, Confinement Anxiety Leckey, Laura R ([id number]) [id number] Page 5 of 8 Medical A Surgical History Notes nd Respiratory lung nodule Genitourinary CKD Objective Constitutional Slightly hypertensive. no acute distress. Vitals Time Taken: 1:26 AM, Height: 73 in, Weight: 365 lbs, BMI: 48.2, Temperature: 98.5 F, Pulse: 63 bpm, Respiratory Rate: 20 breaths/min, Blood Pressure: 147/70 mmHg. Respiratory Normal work of breathing on room air. General Notes: 11/20/2022: The wound is little bit smaller today. There is epithelium encroaching from the perimeter. It is extremely clean without any slough or debris accumulation. Integumentary (Hair, Skin) Wound #1 status is Open. Original cause of wound was Trauma. The date acquired was: 07/09/2022. The wound has been in treatment 3 weeks. The wound is located on the Left,Anterior Lower Leg. The wound measures 0.5cm length x 0.7cm width x 0.1cm depth; 0.275cm^2 area  and 0.027cm^3 volume. There is Fat Layer (Subcutaneous Tissue) exposed. There is no tunneling or undermining noted. There is a medium amount of serosanguineous drainage noted. The wound margin is distinct with the outline attached to the wound base. There is large (67-100%) red granulation within the wound bed. There is no necrotic tissue within the wound bed. The periwound skin appearance had no abnormalities noted for texture. The periwound skin appearance had no abnormalities noted for moisture. The periwound skin appearance exhibited: Hemosiderin Staining. The periwound skin appearance did not exhibit: Erythema. Periwound temperature was noted as No Abnormality. The periwound has tenderness on palpation. Assessment Active Problems ICD-10 Non-pressure chronic ulcer of other part of left lower leg with fat layer exposed Essential (primary) hypertension Localized edema Morbid (severe) obesity due to  excess calories Procedures Wound #1 Pre-procedure diagnosis of Wound #1 is a Venous Leg Ulcer located on the Left,Anterior Lower Leg . There was a Double Layer Compression Therapy Procedure by Baruch Gouty, RN. Post procedure Diagnosis Wound #1: Same as Pre-Procedure Notes: URGO. Plan Follow-up Appointments: Return Appointment in 1 week. - Dr. Celine Ahr Rm 3 Tuesday 2/13 @ 3:00 pm Anesthetic: Wound #1 Left,Anterior Lower Leg: (In clinic) Topical Lidocaine 4% applied to wound bed Bathing/ Shower/ Hygiene: May shower with protection but do not get wound dressing(s) wet. Protect dressing(s) with water repellant cover (for example, large plastic bag) or a cast cover and may then take shower. - May purchase cast protector from [place] or [facility]. The dressing needs to stay dry at all times. If the dressing gets wet, please reach out to [place] and we will make an appt to have the dressing changed. Edema Control - Lymphedema / SCD / Other: Elevate legs to the level of the heart or above for 30 minutes daily and/or when sitting for 3-4 times a day throughout the day. Avoid standing for long periods of time. Exercise regularly Moisturize legs daily. Roscher, SALMAN WELLEN ([id number]) [id number] Page 6 of 8 WOUND #1: - Lower Leg Wound Laterality: Left, Anterior Cleanser: Soap and Water 1 x Per Week/ Discharge Instructions: May shower and wash wound with dial antibacterial soap and water prior to dressing change. Cleanser: Wound Cleanser 1 x Per Week/ Discharge Instructions: Cleanse the wound with wound cleanser prior to applying a clean dressing using gauze sponges, not tissue or cotton balls. Peri-Wound Care: Sween Lotion (Moisturizing lotion) 1 x Per Week/ Discharge Instructions: Apply moisturizing lotion as directed Topical: Gentamicin 1 x Per Week/ Discharge Instructions: As directed by physician Prim Dressing: Promogran Prisma Matrix, 4.34 (sq in) (silver collagen) 1 x  Per Week/ ary Discharge Instructions: Moisten collagen with saline or hydrogel Secondary Dressing: Woven Gauze Sponge, Non-Sterile 4x4 in 1 x Per Week/ Discharge Instructions: Apply over primary dressing as directed. Com pression Wrap: FourPress (4 layer compression wrap) 1 x Per Week/ Discharge Instructions: Apply four layer compression as directed. May also use URGO 2 layer compression system as alternative. 11/20/2022: The wound is little bit smaller today. There is epithelium encroaching from the perimeter. It is extremely clean without any slough or debris accumulation. No debridement was required today. We will continue Prisma silver collagen and 4-layer compression. We seem to finally be making some forward progress with this wound. Hopefully it will close within the next 3 to 4 weeks. Follow-up with me in 1 week's time. Electronic Signature(s) Signed: 11/21/2022 8:19:00 AM By: Fredirick Maudlin MD FACS Previous Signature: 11/20/2022 1:52:01 PM Version By: Fredirick Maudlin MD FACS Entered By: Fredirick Maudlin  on 11/21/2022 08:19:00 -------------------------------------------------------------------------------- HxROS Details Patient Name: Date of Service: MCDO [name], [name] 11/20/2022 1:30 PM Medical Record Number: [id number] Patient Account Number: [id number] Date of Birth/Sex: Treating RN: 1960-12-27 (62 y.o. M) Primary Care Provider: [name] Other Clinician: Referring Provider: Treating Provider/Extender: [name] in Treatment: 3 Eyes Medical History: Negative for: Cataracts; Optic Neuritis Ear/Nose/Mouth/Throat Medical History: Negative for: Chronic sinus problems/congestion; Middle ear problems Hematologic/Lymphatic Medical History: Negative for: Anemia; Hemophilia; Human Immunodeficiency Virus; Lymphedema; Sickle Cell Disease Respiratory Medical History: Positive for: Sleep Apnea Past Medical History Notes: lung  nodule Cardiovascular Medical History: Positive for: Hypertension Gastrointestinal Medical History: Negative for: Cirrhosis ; Colitis; Crohns; Hepatitis A; Hepatitis B; Hepatitis C Kleinschmidt, Kerrington R ([id number]) [id number] Page 7 of 8 Endocrine Medical History: Negative for: Type I Diabetes; Type II Diabetes Genitourinary Medical History: Past Medical History Notes: CKD Immunological Medical History: Negative for: Lupus Erythematosus; Raynauds; Scleroderma Integumentary (Skin) Medical History: Negative for: History of Burn Musculoskeletal Medical History: Positive for: Gout - Rt knee Negative for: Rheumatoid Arthritis; Osteoarthritis; Osteomyelitis Neurologic Medical History: Negative for: Dementia; Neuropathy; Quadriplegia; Paraplegia Oncologic Medical History: Negative for: Received Chemotherapy; Received Radiation Psychiatric Medical History: Negative for: Anorexia/bulimia; Confinement Anxiety Immunizations Pneumococcal Vaccine: Received Pneumococcal Vaccination: No Implantable Devices None Family and Social History Cancer: No; Diabetes: No; Heart Disease: Yes - Mother; Hereditary Spherocytosis: No; Hypertension: Yes - Father; Kidney Disease: No; Lung Disease: No; Seizures: No; Stroke: No; Thyroid Problems: No; Tuberculosis: No; Never smoker; Marital Status - Married; Alcohol Use: Never; Drug Use: No History; Caffeine Use: Never; Financial Concerns: No; Food, Clothing or Shelter Needs: No; Support System Lacking: No; Transportation Concerns: No Electronic Signature(s) Signed: 11/20/2022 3:13:36 PM By: Fredirick Maudlin MD FACS Entered By: Fredirick Maudlin on 11/20/2022 13:50:44 -------------------------------------------------------------------------------- SuperBill Details Patient Name: Date of Service: MCDO WELL, [name] 11/20/2022 Medical Record Number: [id number] Patient Account Number: [id number] Date of Birth/Sex: Treating  RN: [birth year] (62 y.o. M) Primary Care Provider: [name] Other Clinician: Referring Provider: Treating Provider/Extender: [name] in Treatment: Berger, [name] ([id number]) [id number] Page 8 of 8 Diagnosis Coding ICD-10 Codes Code Description [phone] Non-pressure chronic ulcer of other part of left lower leg with fat layer exposed I10 Essential (primary) hypertension R60.0 Localized edema E66.01 Morbid (severe) obesity due to excess calories Facility Procedures : CPT4 Code: [id number] Description: (Facility Use Only) [id number] - [facility] LWR LT LEG Modifier: Quantity: 1 Physician Procedures : CPT4 Code Description Modifier [id number] - WC PHYS LEVEL 3 - EST PT ICD-10 Diagnosis Description L97.822 Non-pressure chronic ulcer of other part of left lower leg with fat layer exposed I10 Essential (primary) hypertension R60.0 Localized edema  E66.01 Morbid (severe) obesity due to excess calories Quantity: 1 Electronic Signature(s) Signed: 11/20/2022 3:13:36 PM By: Fredirick Maudlin MD FACS Signed: 11/20/2022 5:05:54 PM By: Baruch Gouty RN, BSN Previous Signature: 11/20/2022 1:52:18 PM Version By: Fredirick Maudlin MD FACS Entered By: Baruch Gouty on 11/20/2022 14:04:48

## 2022-11-21 NOTE — Progress Notes (Signed)
Gargus, [name] ([id number]) [id number] Page 1 of 7 Visit Report for 11/20/2022 Arrival Information Details Patient Name: Date of Service: [name], GERDEMAN 11/20/2022 1:30 PM Medical Record Number: [id number] Patient Account Number: [id number] Date of Birth/Sex: Treating RN: 09-30-1961 (62 y.o. M) Primary Care Ronda Kazmi: [name] Other Clinician: Referring Roe Koffman: Treating Katleen Carraway/Extender: [name] in Treatment: 3 Visit Information History Since Last Visit All ordered tests and consults were completed: No Patient Arrived: Ambulatory Added or deleted any medications: No Arrival Time: 13:25 Any new allergies or adverse reactions: No Accompanied By: self Had a fall or experienced change in No Transfer Assistance: None activities of daily living that may affect Patient Identification Verified: Yes risk of falls: Secondary Verification Process Completed: Yes Signs or symptoms of abuse/neglect since last visito No Patient Requires Transmission-Based Precautions: No Hospitalized since last visit: No Patient Has Alerts: Yes Implantable device outside of the clinic excluding No Patient Alerts: Patient on Blood Thinner cellular tissue based products placed in the center since last visit: Pain Present Now: No Electronic Signature(s) Signed: 11/20/2022 2:15:56 PM By: Worthy Rancher Entered By: Worthy Rancher on 11/20/2022 13:26:24 -------------------------------------------------------------------------------- Compression Therapy Details Patient Name: Date of Service: Cross Cross, [name]. 11/20/2022 1:30 PM Medical Record Number: [id number] Patient Account Number: [id number] Date of Birth/Sex: Treating RN: [birth year] (62 y.o. Ernestene Mention Primary Care Nabeeha Badertscher: [name] Other Clinician: Referring Nikkie Liming: Treating Gomer France/Extender: [name] in Treatment: 3 Compression Therapy  Performed for Wound Assessment: Wound #1 Left,Anterior Lower Leg Performed By: Clinician Baruch Gouty, RN Compression Type: Double Layer Post Procedure Diagnosis Same as Pre-procedure Notes URGO Electronic Signature(s) Signed: 11/20/2022 5:05:54 PM By: Baruch Gouty RN, BSN Entered By: Baruch Gouty on 11/20/2022 13:47:52 [place], [name] Logan ([id number]) [id number] Page 2 of 7 -------------------------------------------------------------------------------- Encounter Discharge Information Details Patient Name: Date of Service: [name], Cross 11/20/2022 1:30 PM Medical Record Number: [id number] Patient Account Number: [id number] Date of Birth/Sex: Treating RN: September 05, 1961 (62 y.o. Ernestene Mention Primary Care Rayola Everhart: [name] Other Clinician: Referring Deniese Oberry: Treating Liyana Suniga/Extender: [name] in Treatment: 3 Encounter Discharge Information Items Discharge Condition: Stable Ambulatory Status: Ambulatory Discharge Destination: Home Transportation: Private Auto Accompanied By: self Schedule Follow-up Appointment: Yes Clinical Summary of Care: Patient Declined Electronic Signature(s) Signed: 11/20/2022 5:05:54 PM By: Baruch Gouty RN, BSN Entered By: Baruch Gouty on 11/20/2022 14:05:16 -------------------------------------------------------------------------------- Lower Extremity Assessment Details Patient Name: Date of Service: Cross Cross, [name]. 11/20/2022 1:30 PM Medical Record Number: [id number] Patient Account Number: [id number] Date of Birth/Sex: Treating RN: 02/25/1961 (62 y.o. Ernestene Mention Primary Care Latona Krichbaum: [name] Other Clinician: Referring Iniko Robles: Treating Rhiana Morash/Extender: [name] in Treatment: 3 Edema Assessment Assessed: [Left: No] [Right: No] [Left: Edema] [Right: :] Calf Left: Right: Point of Measurement: From Medial  Instep 46 cm Ankle Left: Right: Point of Measurement: From Medial Instep 27.5 cm Vascular Assessment Pulses: Dorsalis Pedis Palpable: [Left:Yes] Electronic Signature(s) Signed: 11/20/2022 5:05:54 PM By: Baruch Gouty RN, BSN Entered By: Baruch Gouty on 11/20/2022 13:35:34 Cross, [name] Logan ([id number]) [id number] Page 3 of 7 -------------------------------------------------------------------------------- Multi Wound Chart Details Patient Name: Date of Service: [name], Cross 11/20/2022 1:30 PM Medical Record Number: [id number] Patient Account Number: [id number] Date of Birth/Sex: Treating RN: [birth year] (62 y.o. M) Primary Care Christianne Zacher: [name] Other Clinician: Referring Mry Lamia: Treating Jodeen Mclin/Extender: [name] in Treatment: 3 Vital Signs Height(in): 73 Pulse(bpm): 27 Weight(lbs): 365 Blood Pressure(mmHg): 147/70 Body Mass  Index(BMI): 48.2 Temperature(F): 98.5 Respiratory Rate(breaths/min): 20 [1:Photos:] [N/A:N/A] Left, Anterior Lower Leg N/A N/A Wound Location: Trauma N/A N/A Wounding Event: Venous Leg Ulcer N/A N/A Primary Etiology: Sleep Apnea, Hypertension, Gout N/A N/A Comorbid History: 07/09/2022 N/A N/A Date Acquired: 3 N/A N/A Weeks of Treatment: Open N/A N/A Wound Status: No N/A N/A Wound Recurrence: 0.5x0.7x0.1 N/A N/A Measurements L x W x D (cm) 0.275 N/A N/A A (cm) : rea 0.027 N/A N/A Volume (cm) : 35.10% N/A N/A % Reduction in A rea: 35.70% N/A N/A % Reduction in Volume: Full Thickness Without Exposed N/A N/A Classification: Support Structures Medium N/A N/A Exudate Amount: Serosanguineous N/A N/A Exudate Type: red, brown N/A N/A Exudate Color: Distinct, outline attached N/A N/A Wound Margin: Large (67-100%) N/A N/A Granulation Amount: Red N/A N/A Granulation Quality: None Present (0%) N/A N/A Necrotic Amount: Fat Layer (Subcutaneous Tissue): Yes  N/A N/A Exposed Structures: Fascia: No Tendon: No Muscle: No Joint: No Bone: No Medium (34-66%) N/A N/A Epithelialization: Rash: No N/A N/A Periwound Skin Texture: Scarring: No Maceration: No N/A N/A Periwound Skin Moisture: Dry/Scaly: No Hemosiderin Staining: Yes N/A N/A Periwound Skin Color: Erythema: No No Abnormality N/A N/A Temperature: Yes N/A N/A Tenderness on Palpation: Compression Therapy N/A N/A Procedures Performed: Treatment Notes Electronic Signature(s) Signed: 11/20/2022 1:49:32 PM By: Fredirick Maudlin MD FACS Entered By: Fredirick Maudlin on 11/20/2022 [place], [name] Logan ([id number]) [id number] Page 4 of 7 -------------------------------------------------------------------------------- Multi-Disciplinary Care Plan Details Patient Name: Date of Service: [name], Cross 11/20/2022 1:30 PM Medical Record Number: [id number] Patient Account Number: [id number] Date of Birth/Sex: Treating RN: November 13, 1960 (62 y.o. Ernestene Mention Primary Care Jlynn Ly: [name] Other Clinician: Referring Harvel Meskill: Treating Jisela Merlino/Extender: [name] in Treatment: 3 Multidisciplinary Care Plan reviewed with physician Active Inactive Nutrition Nursing Diagnoses: Imbalanced nutrition Goals: Patient/caregiver agrees to and verbalizes understanding of need to use nutritional supplements and/or vitamins as prescribed Date Initiated: 10/29/2022 Target Resolution Date: 12/13/2022 Goal Status: Active Interventions: Provide education on nutrition Treatment Activities: Dietary management education, guidance and counseling : 10/29/2022 Giving encouragement to exercise : 10/29/2022 Notes: Wound/Skin Impairment Nursing Diagnoses: Impaired tissue integrity Goals: Ulcer/skin breakdown will have a volume reduction of 30% by week 4 Date Initiated: 10/29/2022 Target Resolution Date: 12/10/2022 Goal Status:  Active Interventions: Assess ulceration(s) every visit Provide education on ulcer and skin care Treatment Activities: Skin care regimen initiated : 10/29/2022 Topical wound management initiated : 10/29/2022 Notes: Electronic Signature(s) Signed: 11/20/2022 5:05:54 PM By: Baruch Gouty RN, BSN Entered By: Baruch Gouty on 11/20/2022 13:42:59 -------------------------------------------------------------------------------- Pain Assessment Details Patient Name: Date of Service: Cross, [name]. 11/20/2022 1:30 PM [place], [name] Logan ([id number]) [id number] Page 5 of 7 Medical Record Number: [id number] Patient Account Number: [id number] Date of Birth/Sex: Treating RN: 1961/06/22 (62 y.o. M) Primary Care Araf Clugston: [name] Other Clinician: Referring Shadeed Colberg: Treating Denisha Hoel/Extender: [name] in Treatment: 3 Active Problems Location of Pain Severity and Description of Pain Patient Has Paino No Site Locations Pain Management and Medication Current Pain Management: Electronic Signature(s) Signed: 11/20/2022 2:15:56 PM By: Worthy Rancher Entered By: Worthy Rancher on 11/20/2022 13:26:59 -------------------------------------------------------------------------------- Patient/Caregiver Education Details Patient Name: Date of Service: Cross Jackquline Bosch 2/6/2024andnbsp1:30 PM Medical Record Number: [id number] Patient Account Number: [id number] Date of Birth/Gender: Treating RN: 04/20/1961 (62 y.o. Ernestene Mention Primary Care Physician: [name] Other Clinician: Referring Physician: Treating Physician/Extender: [name] in Treatment: 3 Education Assessment Education Provided To: Patient Education Topics Provided  Venous: Methods: Explain/Verbal Responses: Reinforcements needed, State content correctly Electronic Signature(s) Signed: 11/20/2022 5:05:54 PM By: Baruch Gouty RN,  BSN Entered By: Baruch Gouty on 11/20/2022 13:43:14 [place], [name] Logan ([id number]) [id number] Page 6 of 7 -------------------------------------------------------------------------------- Wound Assessment Details Patient Name: Date of Service: [name], Cross 11/20/2022 1:30 PM Medical Record Number: [id number] Patient Account Number: [id number] Date of Birth/Sex: Treating RN: 1961-02-27 (62 y.o. Ernestene Mention Primary Care Gennaro Lizotte: [name] Other Clinician: Referring Jep Dyas: Treating Elissia Spiewak/Extender: [name] in Treatment: 3 Wound Status Wound Number: 1 Primary Etiology: Venous Leg Ulcer Wound Location: Left, Anterior Lower Leg Wound Status: Open Wounding Event: Trauma Comorbid History: Sleep Apnea, Hypertension, Gout Date Acquired: 07/09/2022 Weeks Of Treatment: 3 Clustered Wound: No Photos Wound Measurements Length: (cm) 0.5 Width: (cm) 0.7 Depth: (cm) 0.1 Area: (cm) 0.275 Volume: (cm) 0.027 % Reduction in Area: 35.1% % Reduction in Volume: 35.7% Epithelialization: Medium (34-66%) Tunneling: No Undermining: No Wound Description Classification: Full Thickness Without Exposed Support Wound Margin: Distinct, outline attached Exudate Amount: Medium Exudate Type: Serosanguineous Exudate Color: red, brown Structures Foul Odor After Cleansing: No Slough/Fibrino Yes Wound Bed Granulation Amount: Large (67-100%) Exposed Structure Granulation Quality: Red Fascia Exposed: No Necrotic Amount: None Present (0%) Fat Layer (Subcutaneous Tissue) Exposed: Yes Tendon Exposed: No Muscle Exposed: No Joint Exposed: No Bone Exposed: No Periwound Skin Texture Texture Color No Abnormalities Noted: Yes No Abnormalities Noted: No Erythema: No Moisture Hemosiderin Staining: Yes No Abnormalities Noted: Yes Temperature / Pain Temperature: No Abnormality Tenderness on Palpation: Yes Treatment  Notes Wound #1 (Lower Leg) Wound Laterality: Left, Anterior [name], [name] ([id number]) [id number] Page 7 of 7 Cleanser Soap and Water Discharge Instruction: May shower and wash wound with dial antibacterial soap and water prior to dressing change. Wound Cleanser Discharge Instruction: Cleanse the wound with wound cleanser prior to applying a clean dressing using gauze sponges, not tissue or cotton balls. Peri-Wound Care Sween Lotion (Moisturizing lotion) Discharge Instruction: Apply moisturizing lotion as directed Topical Gentamicin Discharge Instruction: As directed by physician Primary Dressing Promogran Prisma Matrix, 4.34 (sq in) (silver collagen) Discharge Instruction: Moisten collagen with saline or hydrogel Secondary Dressing Woven Gauze Sponge, Non-Sterile 4x4 in Discharge Instruction: Apply over primary dressing as directed. Secured With Compression Wrap FourPress (4 layer compression wrap) Discharge Instruction: Apply four layer compression as directed. May also use URGO 2 layer compression system as alternative. Compression Stockings Add-Ons Electronic Signature(s) Signed: 11/20/2022 5:05:54 PM By: Baruch Gouty RN, BSN Entered By: Baruch Gouty on 11/20/2022 13:41:36 -------------------------------------------------------------------------------- Vitals Details Patient Name: Date of Service: Cross Cross, [name]. 11/20/2022 1:30 PM Medical Record Number: [id number] Patient Account Number: [id number] Date of Birth/Sex: Treating RN: [birth year] (62 y.o. M) Primary Care Ramaya Guile: [name] Other Clinician: Referring Tiffiny Worthy: Treating Cas Tracz/Extender: [name] in Treatment: 3 Vital Signs Time Taken: 01:26 Temperature (F): 98.5 Height (in): 73 Pulse (bpm): 63 Weight (lbs): 365 Respiratory Rate (breaths/min): 20 Body Mass Index (BMI): 48.2 Blood Pressure (mmHg): 147/70 Reference Range: 80 -  120 mg / dl Electronic Signature(s) Signed: 11/20/2022 2:15:56 PM By: Worthy Rancher Entered By: Worthy Rancher on 11/20/2022 13:26:49

## 2022-11-27 ENCOUNTER — Encounter (HOSPITAL_BASED_OUTPATIENT_CLINIC_OR_DEPARTMENT_OTHER): Payer: BC Managed Care – PPO | Admitting: General Surgery

## 2022-11-27 DIAGNOSIS — L97822 Non-pressure chronic ulcer of other part of left lower leg with fat layer exposed: Secondary | ICD-10-CM | POA: Diagnosis not present

## 2022-11-28 NOTE — Progress Notes (Signed)
Sellin, AADAM ARGUDO ([id number]) [id number] Page 1 of 7 Visit Report for 11/27/2022 Arrival Information Details Patient Name: Date of Service: MCDO KHRYSTOPHER, MCALPINE 11/27/2022 3:00 PM Medical Record Number: [id number] Patient Account Number: [id number] Date of Birth/Sex: Treating RN: [birth year] (62 y.o. Waldron Session Primary Care Skyrah Krupp: [name] Other Clinician: Referring Jahmere Bramel: Treating Rachna Schonberger/Extender: [name] in Treatment: 4 Visit Information History Since Last Visit Added or deleted any medications: No Patient Arrived: Ambulatory Any new allergies or adverse reactions: No Arrival Time: 14:40 Had a fall or experienced change in No Accompanied By: self activities of daily living that may affect Transfer Assistance: None risk of falls: Patient Identification Verified: Yes Signs or symptoms of abuse/neglect since last visito No Secondary Verification Process Completed: Yes Hospitalized since last visit: No Patient Requires Transmission-Based Precautions: No Implantable device outside of the clinic excluding No Patient Has Alerts: Yes cellular tissue based products placed in the center Patient Alerts: Patient on Blood Thinner since last visit: Has Compression in Place as Prescribed: Yes Pain Present Now: No Electronic Signature(s) Signed: 11/27/2022 4:10:46 PM By: Blanche East RN Entered By: Blanche East on 11/27/2022 14:44:02 -------------------------------------------------------------------------------- Clinic Level of Care Assessment Details Patient Name: Date of Service: MCDO THARUN, PUCK 11/27/2022 3:00 PM Medical Record Number: [id number] Patient Account Number: [id number] Date of Birth/Sex: Treating RN: 1960-11-24 (62 y.o. Waldron Session Primary Care Vardaan Depascale: [name] Other Clinician: Referring Marilyn Nihiser: Treating Garrie Elenes/Extender: [name] in  Treatment: 4 Clinic Level of Care Assessment Items TOOL 4 Quantity Score X- 1 0 Use when only an EandM is performed on FOLLOW-UP visit ASSESSMENTS - Nursing Assessment / Reassessment X- 1 10 Reassessment of Co-morbidities (includes updates in patient status) X- 1 5 Reassessment of Adherence to Treatment Plan ASSESSMENTS - Wound and Skin A ssessment / Reassessment X - Simple Wound Assessment / Reassessment - one wound 1 5 []$  - 0 Complex Wound Assessment / Reassessment - multiple wounds []$  - 0 Dermatologic / Skin Assessment (not related to wound area) ASSESSMENTS - Focused Assessment X- 1 5 Circumferential Edema Measurements - multi extremities []$  - 0 Nutritional Assessment / Counseling / Intervention Flaim, Domique R ([id number] Page 2 of 7 []$  - 0 Lower Extremity Assessment (monofilament, tuning fork, pulses) []$  - 0 Peripheral Arterial Disease Assessment (using hand held doppler) ASSESSMENTS - Ostomy and/or Continence Assessment and Care []$  - 0 Incontinence Assessment and Management []$  - 0 Ostomy Care Assessment and Management (repouching, etc.) PROCESS - Coordination of Care X - Simple Patient / Family Education for ongoing care 1 15 []$  - 0 Complex (extensive) Patient / Family Education for ongoing care []$  - 0 Staff obtains Programmer, systems, Records, T Results / Process Orders est []$  - 0 Staff telephones HHA, Nursing Homes / Clarify orders / etc []$  - 0 Routine Transfer to another Facility (non-emergent condition) []$  - 0 Routine Hospital Admission (non-emergent condition) []$  - 0 New Admissions / Biomedical engineer / Ordering NPWT Apligraf, etc. , []$  - 0 Emergency Hospital Admission (emergent condition) X- 1 10 Simple Discharge Coordination []$  - 0 Complex (extensive) Discharge Coordination PROCESS - Special Needs []$  - 0 Pediatric / Minor Patient Management []$  - 0 Isolation Patient Management []$  - 0 Hearing / Language /  Visual special needs []$  - 0 Assessment of Community assistance (transportation, D/C planning, etc.) []$  - 0 Additional assistance / Altered mentation []$  - 0 Support Surface(s) Assessment (bed, cushion, seat, etc.) INTERVENTIONS - Wound Cleansing / Measurement  X - Simple Wound Cleansing - one wound 1 5 []$  - 0 Complex Wound Cleansing - multiple wounds X- 1 5 Wound Imaging (photographs - any number of wounds) []$  - 0 Wound Tracing (instead of photographs) X- 1 5 Simple Wound Measurement - one wound []$  - 0 Complex Wound Measurement - multiple wounds INTERVENTIONS - Wound Dressings X - Small Wound Dressing one or multiple wounds 1 10 []$  - 0 Medium Wound Dressing one or multiple wounds []$  - 0 Large Wound Dressing one or multiple wounds []$  - 0 Application of Medications - topical []$  - 0 Application of Medications - injection INTERVENTIONS - Miscellaneous []$  - 0 External ear exam []$  - 0 Specimen Collection (cultures, biopsies, blood, body fluids, etc.) []$  - 0 Specimen(s) / Culture(s) sent or taken to Lab for analysis []$  - 0 Patient Transfer (multiple staff / Civil Service fast streamer / Similar devices) []$  - 0 Simple Staple / Suture removal (25 or less) []$  - 0 Complex Staple / Suture removal (26 or more) []$  - 0 Hypo / Hyperglycemic Management (close monitor of Blood Glucose) Frisina, Dia R ([id number] Page 3 of 7 []$  - 0 Ankle / Brachial Index (ABI) - do not check if billed separately X- 1 5 Vital Signs Has the patient been seen at the hospital within the last three years: Yes Total Score: 80 Level Of Care: New/Established - Level 3 Electronic Signature(s) Signed: 11/27/2022 4:10:46 PM By: Blanche East RN Entered By: Blanche East on 11/27/2022 15:14:53 -------------------------------------------------------------------------------- Encounter Discharge Information Details Patient Name: Date of Service: [place], Karman R. 11/27/2022 3:00  PM Medical Record Number: [id number] Patient Account Number: [id number] Date of Birth/Sex: Treating RN: 1961/06/06 (62 y.o. Waldron Session Primary Care Jontue Crumpacker: [name] Other Clinician: Referring Nancey Kreitz: Treating Coda Mathey/Extender: [name] in Treatment: 4 Encounter Discharge Information Items Discharge Condition: Stable Ambulatory Status: Ambulatory Discharge Destination: Home Transportation: Private Auto Accompanied By: self Schedule Follow-up Appointment: Yes Clinical Summary of Care: Electronic Signature(s) Signed: 11/27/2022 3:15:31 PM By: Blanche East RN Entered By: Blanche East on 11/27/2022 15:15:31 -------------------------------------------------------------------------------- Lower Extremity Assessment Details Patient Name: Date of Service: MCDO KALYNN, BURMEISTER 11/27/2022 3:00 PM Medical Record Number: [id number] Patient Account Number: [id number] Date of Birth/Sex: Treating RN: Jun 05, 1961 (62 y.o. Waldron Session Primary Care Lexia Vandevender: [name] Other Clinician: Referring Cullen Lahaie: Treating Taya Ashbaugh/Extender: [name] in Treatment: 4 Edema Assessment Assessed: [Left: No] [Right: No] [Left: Edema] [Right: :] Calf Left: Right: Point of Measurement: From Medial Instep 48.5 cm Ankle Left: Right: Point of Measurement: From Medial Instep 29 cm Vascular Assessment Heilman, Ademide R ([id number]) [id number] Page 4 of 7] Pulses: Dorsalis Pedis Palpable: [Left:Yes] Electronic Signature(s) Signed: 11/27/2022 4:10:46 PM By: Blanche East RN Entered By: Blanche East on 11/27/2022 14:45:58 -------------------------------------------------------------------------------- Multi Wound Chart Details Patient Name: Date of Service: [place], Cai R. 11/27/2022 3:00 PM Medical Record Number: [id number] Patient Account Number: [id number] Date of Birth/Sex:  Treating RN: April 23, 1961 (62 y.o. M) Primary Care Brennin Durfee: [name] Other Clinician: Referring Azaryah Oleksy: Treating Kashara Blocher/Extender: [name] in Treatment: 4 Vital Signs Height(in): 73 Pulse(bpm): 4 Weight(lbs): 55 Blood Pressure(mmHg): 158/79 Body Mass Index(BMI): 48.2 Temperature(F): 97.8 Respiratory Rate(breaths/min): 18 [1:Photos: No Photos Left, Anterior Lower Leg Wound Location: Trauma Wounding Event: Venous Leg Ulcer Primary Etiology: Sleep Apnea, Hypertension, Gout Comorbid History: 07/09/2022 Date Acquired: 4 Weeks of Treatment: Healed - Epithelialized Wound Status: No Wound  Recurrence: 0x0x0 Measurements L x W x D (cm) 0  A (cm) : rea 0 Volume (cm) : 100.00% % Reduction in A rea: 100.00% % Reduction in Volume: Full Thickness Without Exposed Classification: Support Structures None Present Exudate Amount: Distinct, outline  attached Wound Margin: Large (67-100%) Granulation Amount: Red Granulation Quality: None Present (0%) Necrotic Amount: Fascia: No Exposed Structures: Fat Layer (Subcutaneous Tissue): No Tendon: No Muscle: No Joint: No Bone: No Small (1-33%)  Epithelialization: Rash: No Periwound Skin Texture: Scarring: No Maceration: No Periwound Skin Moisture: Dry/Scaly: No Hemosiderin Staining: Yes Periwound Skin Color: Erythema: No No Abnormality Temperature: Yes Tenderness on Palpation:] [N/A:N/A N/A N/A  N/A N/A N/A N/A N/A N/A N/A N/A N/A N/A N/A N/A N/A N/A N/A N/A N/A N/A N/A N/A N/A N/A N/A N/A] Treatment Notes Electronic Signature(s) Signed: 11/27/2022 3:05:51 PM By: Fredirick Maudlin MD FACS Entered By: Fredirick Maudlin on 11/27/2022 15:05:51 [place], [name] ([id number] Page 5 of 7 -------------------------------------------------------------------------------- Multi-Disciplinary Care Plan Details Patient Name: Date of Service: MCDO PIERCEN, EHRSAM 11/27/2022 3:00 PM Medical Record  Number: [id number] Patient Account Number: [id number] Date of Birth/Sex: Treating RN: [birth year] (62 y.o. Waldron Session Primary Care Lebert Lovern: [name] Other Clinician: Referring Jen Eppinger: Treating Edric Fetterman/Extender: [name] in Treatment: [place] reviewed with physician Active Inactive Electronic Signature(s) Signed: 11/27/2022 3:20:20 PM By: Blanche East RN Entered By: Blanche East on 11/27/2022 15:20:19 -------------------------------------------------------------------------------- Pain Assessment Details Patient Name: Date of Service: MCDO OSEIAS, REGENOLD R. 11/27/2022 3:00 PM Medical Record Number: [id number] Patient Account Number: [id number] Date of Birth/Sex: Treating RN: 1961-03-06 (62 y.o. Waldron Session Primary Care Sherron Mapp: [name] Other Clinician: Referring Darric Plante: Treating Caroly Purewal/Extender: [name] in Treatment: 4 Active Problems Location of Pain Severity and Description of Pain Patient Has Paino No Site Locations Rate the pain. Current Pain Level: 0 Pain Management and Medication Current Pain Management: Electronic Signature(s) Signed: 11/27/2022 4:10:46 PM By: Blanche East RN Entered By: Blanche East on 11/27/2022 14:45:14 [place], [name] ([id number] Page 6 of 7 -------------------------------------------------------------------------------- Patient/Caregiver Education Details Patient Name: Date of Service: MCDO JALEEL, COBAUGH 2/13/2024andnbsp3:00 PM Medical Record Number: [id number] Patient Account Number: [id number] Date of Birth/Gender: Treating RN: 1961-02-12 (62 y.o. Waldron Session Primary Care Physician: [name] Other Clinician: Referring Physician: Treating Physician/Extender: [name] in Treatment: 4 Education Assessment Education Provided  To: Patient Education Topics Provided Electronic Signature(s) Signed: 11/27/2022 4:10:46 PM By: Blanche East RN Entered By: Blanche East on 11/27/2022 15:20:29 -------------------------------------------------------------------------------- Wound Assessment Details Patient Name: Date of Service: [place], Vencil R. 11/27/2022 3:00 PM Medical Record Number: [id number] Patient Account Number: [id number] Date of Birth/Sex: Treating RN: Jun 30, 1961 (62 y.o. Waldron Session Primary Care Lillien Petronio: [name] Other Clinician: Referring Kaisha Wachob: Treating Dontavis Tschantz/Extender: [name] in Treatment: 4 Wound Status Wound Number: 1 Primary Etiology: Venous Leg Ulcer Wound Location: Left, Anterior Lower Leg Wound Status: Healed - Epithelialized Wounding Event: Trauma Comorbid History: Sleep Apnea, Hypertension, Gout Date Acquired: 07/09/2022 Weeks Of Treatment: 4 Clustered Wound: No Wound Measurements Length: (cm) Width: (cm) Depth: (cm) Area: (cm) Volume: (cm) 0 % Reduction in Area: 100% 0 % Reduction in Volume: 100% 0 Epithelialization: Small (1-33%) 0 Tunneling: No 0 Undermining: No Wound Description Classification: Full Thickness Without Exposed Support Wound Margin: Distinct, outline attached Exudate Amount: None Present Structures Foul Odor After Cleansing: No Slough/Fibrino No Wound Bed Granulation Amount: Large (67-100%) Exposed Structure Granulation Quality: Red Fascia Exposed: No Necrotic Amount: None Present (0%) Fat Layer (  Subcutaneous Tissue) Exposed: No Tendon Exposed: No Muscle Exposed: No Joint Exposed: No Crock, [place] ([id number]) [phone].pdf Page 7 of 7 Bone Exposed: No Periwound Skin Texture Texture Color No Abnormalities Noted: Yes No Abnormalities Noted: No Erythema: No Moisture Hemosiderin Staining: Yes No Abnormalities Noted: Yes Temperature / Pain Temperature: No  Abnormality Tenderness on Palpation: Yes Electronic Signature(s) Signed: 11/27/2022 4:10:46 PM By: Blanche East RN Entered By: Blanche East on 11/27/2022 15:00:54 -------------------------------------------------------------------------------- Vitals Details Patient Name: Date of Service: [place], Jerone R. 11/27/2022 3:00 PM Medical Record Number: [id number] Patient Account Number: [id number] Date of Birth/Sex: Treating RN: [birth year] (62 y.o. Waldron Session Primary Care Olyver Hawes: [name] Other Clinician: Referring Keontae Levingston: Treating Candler Ginsberg/Extender: [name] in Treatment: 4 Vital Signs Time Taken: 14:44 Temperature (F): 97.8 Height (in): 73 Pulse (bpm): 62 Weight (lbs): 365 Respiratory Rate (breaths/min): 18 Body Mass Index (BMI): 48.2 Blood Pressure (mmHg): 158/79 Reference Range: 80 - 120 mg / dl Electronic Signature(s) Signed: 11/27/2022 4:10:46 PM By: Blanche East RN Entered By: Blanche East on 11/27/2022 14:45:01

## 2022-11-28 NOTE — Progress Notes (Signed)
Feenstra, MATTHEY FULLARD ([id number]) [id number] Page 1 of 7 Visit Report for 11/27/2022 Chief Complaint Document Details Patient Name: Date of Service: MCDO KIMBER, MENNE 11/27/2022 3:00 PM Medical Record Number: [id number] Patient Account Number: [id number] Date of Birth/Sex: Treating RN: 1961-05-13 (62 y.o. M) Primary Care Provider: [name] Other Clinician: Referring Provider: Treating Provider/Extender: [name] in Treatment: 4 Information Obtained from: Patient Chief Complaint Patient seen for complaints of Non-Healing Wound. Electronic Signature(s) Signed: 11/27/2022 3:06:03 PM By: Fredirick Maudlin MD FACS Entered By: Fredirick Maudlin on 11/27/2022 15:06:03 -------------------------------------------------------------------------------- HPI Details Patient Name: Date of Service: Yadkin, Cian R. 11/27/2022 3:00 PM Medical Record Number: [id number] Patient Account Number: [id number] Date of Birth/Sex: Treating RN: [birth year] (62 y.o. M) Primary Care Provider: [name] Other Clinician: Referring Provider: Treating Provider/Extender: [name] in Treatment: 4 History of Present Illness HPI Description: ADMISSION 10/29/2022 This is a 62 year old man with a past medical history significant for obesity, history of DVT borderline diabetes, and gout. He apparently bumped his left mid , pretibial area on a plow in October 2023. he developed a wound in this area that simply has not healed. When he saw his P CP in November, a DVT scan was done that showed an acute thrombus in the gastrocnemius vein in the calf with chronic thrombus in the distal femoral and popliteal veins. He does have a history of prior DVT in this leg. A culture was taken that grew out Pseudomonas and he was prescribed levofloxacin, which has been continued since that time. The leg has been cultured multiple times with  persistent isolation of Pseudomonas, heavy growth. The culture reports do indicate sensitivity to levofloxacin. Most recent hemoglobin A1c was 6.5. ABI in clinic today was 1.27. On the anterior tibial surface of the left leg, there is a small circular ulcer with thick yellow slough on the surface. The patient has 2+ pitting edema and discoloration consistent with stasis dermatitis. 11/05/2022: The wound was smaller by half this week. There is still some slough accumulation on the surface. Edema control is better. 11/12/2022: For some reason his wound measured larger today, although it does not appear to be so. There is some light slough on the surface. 11/20/2022: The wound is little bit smaller today. There is epithelium encroaching from the perimeter. It is extremely clean without any slough or debris accumulation. 11/27/2022: His wound is healed. Electronic Signature(s) Signed: 11/27/2022 3:06:22 PM By: Fredirick Maudlin MD FACS Entered By: Fredirick Maudlin on 11/27/2022 15:06:22 Superior, [name] ([id number] Page 2 of 7 -------------------------------------------------------------------------------- Physical Exam Details Patient Name: Date of Service: MCDO CEON, GRISHABER. 11/27/2022 3:00 PM Medical Record Number: [id number] Patient Account Number: [id number] Date of Birth/Sex: Treating RN: [birth year] (62 y.o. M) Primary Care Provider: [name] Other Clinician: Referring Provider: Treating Provider/Extender: [name] in Treatment: 4 Constitutional Hypertensive, asymptomatic. . . . no acute distress. Respiratory Normal work of breathing on room air. Notes 11/27/2022: His wound is healed. Electronic Signature(s) Signed: 11/27/2022 3:07:06 PM By: Fredirick Maudlin MD FACS Entered By: Fredirick Maudlin on 11/27/2022 15:07:06 -------------------------------------------------------------------------------- Physician  Orders Details Patient Name: Date of Service: MCDO WELL, Javier R. 11/27/2022 3:00 PM Medical Record Number: [id number] Patient Account Number: [id number] Date of Birth/Sex: Treating RN: 01-15-1961 (62 y.o. Waldron Session Primary Care Provider: [name] Other Clinician: Referring Provider: Treating Provider/Extender: [name] in Treatment: 4 Verbal / Phone Orders: No Diagnosis Coding ICD-10 Coding Code Description  [id number] Non-pressure chronic ulcer of other part of left lower leg with fat layer exposed I10 Essential (primary) hypertension R60.0 Localized edema E66.01 Morbid (severe) obesity due to excess calories Discharge From [facility] Discharge from [place]!!! It was great caring for you! Call us if you need [place]! Bathing/ Shower/ Hygiene May shower and wash wound with soap and water. Edema Control - Lymphedema / SCD / Other Left Lower Extremity Elevate legs to the level of the heart or above for 30 minutes daily and/or when sitting for 3-4 times a day throughout the day. Avoid standing for long periods of time. Exercise regularly Moisturize legs daily. Electronic Signature(s) Signed: 11/27/2022 3:07:19 PM By: Fredirick Maudlin MD FACS Entered By: Fredirick Maudlin on 11/27/2022 15:07:19 Cullins, [name] ([id number] Page 3 of 7 -------------------------------------------------------------------------------- Problem List Details Patient Name: Date of Service: MCDO DAGEM, LAZER 11/27/2022 3:00 PM Medical Record Number: [id number] Patient Account Number: [id number] Date of Birth/Sex: Treating RN: 1961/06/10 (62 y.o. M) Primary Care Provider: [name] Other Clinician: Referring Provider: Treating Provider/Extender: [name] in Treatment: 4 Active Problems ICD-10 Encounter Code Description Active Date MDM Diagnosis [phone]  Non-pressure chronic ulcer of other part of left lower leg with fat layer exposed1/15/2024 No Yes I10 Essential (primary) hypertension 10/29/2022 No Yes R60.0 Localized edema 10/29/2022 No Yes E66.01 Morbid (severe) obesity due to excess calories 10/29/2022 No Yes Inactive Problems Resolved Problems Electronic Signature(s) Signed: 11/27/2022 3:05:44 PM By: Fredirick Maudlin MD FACS Entered By: Fredirick Maudlin on 11/27/2022 15:05:44 -------------------------------------------------------------------------------- Progress Note Details Patient Name: Date of Service: Jack, Thad R. 11/27/2022 3:00 PM Medical Record Number: [id number] Patient Account Number: [id number] Date of Birth/Sex: Treating RN: [birth year] (62 y.o. M) Primary Care Provider: [name] Other Clinician: Referring Provider: Treating Provider/Extender: [name] in Treatment: 4 Subjective Chief Complaint Information obtained from Patient Patient seen for complaints of Non-Healing Wound. History of Present Illness (HPI) ADMISSION 10/29/2022 This is a 62 year old man with a past medical history significant for obesity, history of DVT borderline diabetes, and gout. He apparently bumped his left mid , pretibial area on a plow in October 2023. he developed a wound in this area that simply has not healed. When he saw his P CP in November, a DVT scan was Venning, Bexton R ([id number]) [id number] Page 4 of 7 done that showed an acute thrombus in the gastrocnemius vein in the calf with chronic thrombus in the distal femoral and popliteal veins. He does have a history of prior DVT in this leg. A culture was taken that grew out Pseudomonas and he was prescribed levofloxacin, which has been continued since that time. The leg has been cultured multiple times with persistent isolation of Pseudomonas, heavy growth. The culture reports do indicate sensitivity to  levofloxacin. Most recent hemoglobin A1c was 6.5. ABI in clinic today was 1.27. On the anterior tibial surface of the left leg, there is a small circular ulcer with thick yellow slough on the surface. The patient has 2+ pitting edema and discoloration consistent with stasis dermatitis. 11/05/2022: The wound was smaller by half this week. There is still some slough accumulation on the surface. Edema control is better. 11/12/2022: For some reason his wound measured larger today, although it does not appear to be so. There is some light slough on the surface. 11/20/2022: The wound is little bit smaller today. There is epithelium encroaching from the perimeter. It is extremely clean without any slough or  debris accumulation. 11/27/2022: His wound is healed. Patient History Family History Heart Disease - Mother, Hypertension - Father, No family history of Cancer, Diabetes, Hereditary Spherocytosis, Kidney Disease, Lung Disease, Seizures, Stroke, Thyroid Problems, Tuberculosis. Social History Never smoker, Marital Status - Married, Alcohol Use - Never, Drug Use - No History, Caffeine Use - Never. Medical History Eyes Denies history of Cataracts, Optic Neuritis Ear/Nose/Mouth/Throat Denies history of Chronic sinus problems/congestion, Middle ear problems Hematologic/Lymphatic Denies history of Anemia, Hemophilia, Human Immunodeficiency Virus, Lymphedema, Sickle Cell Disease Respiratory Patient has history of Sleep Apnea Cardiovascular Patient has history of Hypertension Gastrointestinal Denies history of Cirrhosis , Colitis, Crohnoos, Hepatitis A, Hepatitis B, Hepatitis C Endocrine Denies history of Type I Diabetes, Type II Diabetes Immunological Denies history of Lupus Erythematosus, Raynaudoos, Scleroderma Integumentary (Skin) Denies history of History of Burn Musculoskeletal Patient has history of Gout - Rt knee Denies history of Rheumatoid Arthritis, Osteoarthritis,  Osteomyelitis Neurologic Denies history of Dementia, Neuropathy, Quadriplegia, Paraplegia Oncologic Denies history of Received Chemotherapy, Received Radiation Psychiatric Denies history of Anorexia/bulimia, Confinement Anxiety Medical A Surgical History Notes nd Respiratory lung nodule Genitourinary CKD Objective Constitutional Hypertensive, asymptomatic. no acute distress. Vitals Time Taken: 2:44 PM, Height: 73 in, Weight: 365 lbs, BMI: 48.2, Temperature: 97.8 F, Pulse: 62 bpm, Respiratory Rate: 18 breaths/min, Blood Pressure: 158/79 mmHg. Respiratory Normal work of breathing on room air. General Notes: 11/27/2022: His wound is healed. Integumentary (Hair, Skin) Wound #1 status is Healed - Epithelialized. Original cause of wound was Trauma. The date acquired was: 07/09/2022. The wound has been in treatment 4 weeks. The wound is located on the Left,Anterior Lower Leg. The wound measures 0cm length x 0cm width x 0cm depth; 0cm^2 area and 0cm^3 volume. There is no tunneling or undermining noted. There is a none present amount of drainage noted. The wound margin is distinct with the outline attached to the wound base. There is large (67-100%) red granulation within the wound bed. There is no necrotic tissue within the wound bed. The periwound skin appearance had no Rathert, Rane R ([id number]) [phone].pdf Page 5 of 7 abnormalities noted for texture. The periwound skin appearance had no abnormalities noted for moisture. The periwound skin appearance exhibited: Hemosiderin Staining. The periwound skin appearance did not exhibit: Erythema. Periwound temperature was noted as No Abnormality. The periwound has tenderness on palpation. Assessment Active Problems ICD-10 Non-pressure chronic ulcer of other part of left lower leg with fat layer exposed Essential (primary) hypertension Localized edema Morbid (severe) obesity due to excess calories Plan Discharge  From [facility] Services: Discharge from [place]!!! It was great caring for you! Call us if you need [place]! Bathing/ Shower/ Hygiene: May shower and wash wound with soap and water. Edema Control - Lymphedema / SCD / Other: Elevate legs to the level of the heart or above for 30 minutes daily and/or when sitting for 3-4 times a day throughout the day. Avoid standing for long periods of time. Exercise regularly Moisturize legs daily. 11/27/2022: His wound is healed. I recommended that he protect the site with a light dressing for the next couple of weeks. He should wear bilateral compression stockings on a regular basis. He should try to elevate his legs whenever possible during the day and when he sleeps at night. We will discharge him from the wound care center. He may follow-up as needed. Electronic Signature(s) Signed: 11/27/2022 3:07:58 PM By: Fredirick Maudlin MD FACS Entered By: Fredirick Maudlin on 11/27/2022 15:07:58 -------------------------------------------------------------------------------- HxROS Details Patient Name: Date  of Service: MCDO MADELINE, RENTSCH R. 11/27/2022 3:00 PM Medical Record Number: [id number] Patient Account Number: [id number] Date of Birth/Sex: Treating RN: August 23, 1961 (62 y.o. M) Primary Care Provider: [name] Other Clinician: Referring Provider: Treating Provider/Extender: [name] in Treatment: 4 Eyes Medical History: Negative for: Cataracts; Optic Neuritis Ear/Nose/Mouth/Throat Medical History: Negative for: Chronic sinus problems/congestion; Middle ear problems Hematologic/Lymphatic Medical History: Negative for: Anemia; Hemophilia; Human Immunodeficiency Virus; Lymphedema; Sickle Cell Disease Respiratory Medical History: Ke, DICKY CHACE ([id number]) [id number] Page 6 of 7 Positive for: Sleep Apnea Past Medical History Notes: lung nodule Cardiovascular Medical  History: Positive for: Hypertension Gastrointestinal Medical History: Negative for: Cirrhosis ; Colitis; Crohns; Hepatitis A; Hepatitis B; Hepatitis C Endocrine Medical History: Negative for: Type I Diabetes; Type II Diabetes Genitourinary Medical History: Past Medical History Notes: CKD Immunological Medical History: Negative for: Lupus Erythematosus; Raynauds; Scleroderma Integumentary (Skin) Medical History: Negative for: History of Burn Musculoskeletal Medical History: Positive for: Gout - Rt knee Negative for: Rheumatoid Arthritis; Osteoarthritis; Osteomyelitis Neurologic Medical History: Negative for: Dementia; Neuropathy; Quadriplegia; Paraplegia Oncologic Medical History: Negative for: Received Chemotherapy; Received Radiation Psychiatric Medical History: Negative for: Anorexia/bulimia; Confinement Anxiety Immunizations Pneumococcal Vaccine: Received Pneumococcal Vaccination: No Implantable Devices None Family and Social History Cancer: No; Diabetes: No; Heart Disease: Yes - Mother; Hereditary Spherocytosis: No; Hypertension: Yes - Father; Kidney Disease: No; Lung Disease: No; Seizures: No; Stroke: No; Thyroid Problems: No; Tuberculosis: No; Never smoker; Marital Status - Married; Alcohol Use: Never; Drug Use: No History; Caffeine Use: Never; Financial Concerns: No; Food, Clothing or Shelter Needs: No; Support System Lacking: No; Transportation Concerns: No Electronic Signature(s) Signed: 11/27/2022 3:48:13 PM By: Fredirick Maudlin MD FACS Entered By: Fredirick Maudlin on 11/27/2022 15:06:28 Follett, [name] ([id number] Page 7 of 7 -------------------------------------------------------------------------------- SuperBill Details Patient Name: Date of Service: MCDO DAZHON, NEWITT 11/27/2022 Medical Record Number: [id number] Patient Account Number: [id number] Date of Birth/Sex: Treating RN: 07/11/1961 (62 y.o. M) Primary  Care Provider: [name] Other Clinician: Referring Provider: Treating Provider/Extender: [name] in Treatment: 4 Diagnosis Coding ICD-10 Codes Code Description [phone] Non-pressure chronic ulcer of other part of left lower leg with fat layer exposed I10 Essential (primary) hypertension R60.0 Localized edema E66.01 Morbid (severe) obesity due to excess calories Facility Procedures : CPT4 Code: [id number] Description: 99213 - WOUND CARE VISIT-LEV 3 EST PT Modifier: 25 Quantity: 1 Physician Procedures : CPT4 Code Description Modifier [id number] - WC PHYS LEVEL 3 - EST PT ICD-10 Diagnosis Description L97.822 Non-pressure chronic ulcer of other part of left lower leg with fat layer exposed R60.0 Localized edema I10 Essential (primary) hypertension  E66.01 Morbid (severe) obesity due to excess calories Quantity: 1 Electronic Signature(s) Signed: 11/27/2022 3:15:08 PM By: Blanche East RN Signed: 11/27/2022 3:48:13 PM By: Fredirick Maudlin MD FACS Previous Signature: 11/27/2022 3:08:17 PM Version By: Fredirick Maudlin MD FACS Entered By: Blanche East on 11/27/2022 15:15:08

## 2022-11-29 ENCOUNTER — Other Ambulatory Visit: Payer: Self-pay | Admitting: Medical

## 2022-11-29 ENCOUNTER — Other Ambulatory Visit: Payer: Self-pay | Admitting: Family Medicine

## 2022-11-29 DIAGNOSIS — R600 Localized edema: Secondary | ICD-10-CM

## 2022-12-12 ENCOUNTER — Other Ambulatory Visit: Payer: Self-pay | Admitting: *Deleted

## 2022-12-12 DIAGNOSIS — I82402 Acute embolism and thrombosis of unspecified deep veins of left lower extremity: Secondary | ICD-10-CM

## 2022-12-12 DIAGNOSIS — I2690 Septic pulmonary embolism without acute cor pulmonale: Secondary | ICD-10-CM

## 2022-12-13 ENCOUNTER — Ambulatory Visit (HOSPITAL_BASED_OUTPATIENT_CLINIC_OR_DEPARTMENT_OTHER)
Admission: RE | Admit: 2022-12-13 | Discharge: 2022-12-13 | Disposition: A | Discharge status: Home / Self Care | Payer: BC Managed Care – PPO | Source: Ambulatory Visit | Attending: Hematology & Oncology | Admitting: Hematology & Oncology

## 2022-12-13 ENCOUNTER — Encounter: Payer: Self-pay | Admitting: Hematology & Oncology

## 2022-12-13 ENCOUNTER — Inpatient Hospital Stay: Payer: BC Managed Care – PPO | Attending: Hematology & Oncology

## 2022-12-13 ENCOUNTER — Inpatient Hospital Stay: Payer: BC Managed Care – PPO | Admitting: Hematology & Oncology

## 2022-12-13 VITALS — BP 136/72 | HR 58 | Temp 97.7°F | Resp 20 | Ht 73.0 in | Wt 360.0 lb

## 2022-12-13 DIAGNOSIS — I82402 Acute embolism and thrombosis of unspecified deep veins of left lower extremity: Secondary | ICD-10-CM | POA: Insufficient documentation

## 2022-12-13 DIAGNOSIS — Z7901 Long term (current) use of anticoagulants: Secondary | ICD-10-CM | POA: Insufficient documentation

## 2022-12-13 DIAGNOSIS — Z86711 Personal history of pulmonary embolism: Secondary | ICD-10-CM | POA: Insufficient documentation

## 2022-12-13 DIAGNOSIS — I82462 Acute embolism and thrombosis of left calf muscular vein: Secondary | ICD-10-CM | POA: Diagnosis present

## 2022-12-13 DIAGNOSIS — I2690 Septic pulmonary embolism without acute cor pulmonale: Secondary | ICD-10-CM

## 2022-12-13 DIAGNOSIS — I2609 Other pulmonary embolism with acute cor pulmonale: Secondary | ICD-10-CM

## 2022-12-13 LAB — CMP (CANCER CENTER ONLY)
ALT: 21 U/L (ref 0–44)
AST: 19 U/L (ref 15–41)
Albumin: 4.3 g/dL (ref 3.5–5.0)
Alkaline Phosphatase: 84 U/L (ref 38–126)
Anion gap: 8 (ref 5–15)
BUN: 14 mg/dL (ref 8–23)
CO2: 29 mmol/L (ref 22–32)
Calcium: 9.4 mg/dL (ref 8.9–10.3)
Chloride: 102 mmol/L (ref 98–111)
Creatinine: 1 mg/dL (ref 0.61–1.24)
GFR, Estimated: >60 mL/min (ref >60)
Glucose, Bld: 106 mg/dL — ABNORMAL HIGH (ref 70–99)
Potassium: 4.1 mmol/L (ref 3.5–5.1)
Sodium: 139 mmol/L (ref 135–145)
Total Bilirubin: 0.7 mg/dL (ref 0.3–1.2)
Total Protein: 6.8 g/dL (ref 6.5–8.1)

## 2022-12-13 LAB — CBC WITH DIFFERENTIAL (CANCER CENTER ONLY)
Abs Immature Granulocytes: 0.01 10*3/uL (ref 0.00–0.07)
Basophils Absolute: 0.1 10*3/uL (ref 0.0–0.1)
Basophils Relative: 1 %
Eosinophils Absolute: 0.5 10*3/uL (ref 0.0–0.5)
Eosinophils Relative: 7 %
HCT: 43 % (ref 39.0–52.0)
Hemoglobin: 14.9 g/dL (ref 13.0–17.0)
Immature Granulocytes: 0 %
Lymphocytes Relative: 34 %
Lymphs Abs: 2.3 10*3/uL (ref 0.7–4.0)
MCH: 32 pg (ref 26.0–34.0)
MCHC: 34.7 g/dL (ref 30.0–36.0)
MCV: 92.5 fL (ref 80.0–100.0)
Monocytes Absolute: 0.6 10*3/uL (ref 0.1–1.0)
Monocytes Relative: 9 %
Neutro Abs: 3.4 10*3/uL (ref 1.7–7.7)
Neutrophils Relative %: 49 %
Platelet Count: 202 10*3/uL (ref 150–400)
RBC: 4.65 MIL/uL (ref 4.22–5.81)
RDW: 12.2 % (ref 11.5–15.5)
WBC Count: 6.8 10*3/uL (ref 4.0–10.5)
nRBC: 0 % (ref 0.0–0.2)

## 2022-12-13 LAB — D-DIMER, QUANTITATIVE: D-Dimer, Quant: 0.27 ug/mL-FEU (ref 0.00–0.50)

## 2022-12-13 LAB — LACTATE DEHYDROGENASE: LDH: 155 U/L (ref 98–192)

## 2022-12-13 NOTE — Progress Notes (Signed)
Hematology and Oncology Follow Up Visit  [name] [id number] August 12, 1961 62 y.o. 12/13/2022   Principle Diagnosis:  Acute thrombus in left gastrocnemius vein --recurrent thromboembolic disease-past history of pulmonary embolus and left leg DVT --idiopathic  Current Therapy:   Eliquis 5 mg p.o. twice daily-lifelong     Interim History:  [name] is back for follow-up.  We last saw him back in November.  He has been doing well.  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  He got triple pay for this.  He did have a Doppler of his left leg today.  He is is a chronic thrombus.  However, there is nonocclusive.  There is nothing that is acute or extensive.  He has had no bleeding.  Have apparently, he did hit his left leg I think back in September or October.  He did require some wound care therapy.  This seemed to work very nicely.  He has had no problems with cough or shortness of breath.  He has had no nausea or vomiting.  There is been no change in bowel or bladder habits.  He has had no bleeding.  Overall, I would say that his performance status is probably ECOG 1.  She has great  Medications:  Current Outpatient Medications:    allopurinol (ZYLOPRIM) 100 MG tablet, Take 2 tablets (200 mg total) by mouth daily., Disp: 180 tablet, Rfl: 3   carvedilol (COREG) 12.5 MG tablet, Take 1 tablet (12.5 mg total) by mouth daily., Disp: 90 tablet, Rfl: 1   ELIQUIS 5 MG TABS tablet, TAKE 1 TABLET BY MOUTH 2 TIMES A DAY, Disp: 60 tablet, Rfl: 2   furosemide (LASIX) 40 MG tablet, TAKE 1 TABLET BY MOUTH 2 TIMES A DAY, Disp: 180 tablet, Rfl: 1   levothyroxine (SYNTHROID) 50 MCG tablet, TAKE 1 TABLET BY MOUTH EVERY DAY BEFORE BREAKFAST, Disp: 90 tablet, Rfl: 1   losartan-hydrochlorothiazide (HYZAAR) 100-12.5 MG tablet, TAKE 1 TABLET BY MOUTH EVERY DAY, Disp: 90 tablet, Rfl: 1   potassium chloride SA (KLOR-CON M) 20 MEQ  tablet, TAKE 1 TABLET BY MOUTH EVERY DAY, Disp: 90 tablet, Rfl: 1   benzonatate (TESSALON) 100 MG capsule, Take 1 capsule (100 mg total) by mouth 3 (three) times daily as needed for cough. (Patient not taking: Reported on 12/13/2022), Disp: 30 capsule, Rfl: 0   fluticasone (FLONASE) 50 MCG/ACT nasal spray, USE 2 SPRAYS IN EACH NOSTRIL ONCE A DAY (Patient not taking: Reported on 12/13/2022), Disp: 16 g, Rfl: 1  Allergies: No Known Allergies  Past Medical History, Surgical history, Social history, and Family History were reviewed and updated.  Review of Systems: Review of Systems  Constitutional: Negative.   HENT:  Negative.    Eyes: Negative.   Respiratory: Negative.    Cardiovascular: Negative.   Gastrointestinal: Negative.   Endocrine: Negative.   Genitourinary: Negative.    Musculoskeletal:  Positive for arthralgias.  Skin: Negative.   Neurological: Negative.   Hematological: Negative.   Psychiatric/Behavioral: Negative.      Physical Exam:  height is '6\' 1"'$  (1.854 m) and weight is 360 lb (163.3 kg) (abnormal). His oral temperature is 97.7 F (36.5 C). His blood pressure is 136/72 and his pulse is 58 (abnormal). His respiration is 20 and oxygen saturation is 99%.   Wt Readings from Last 3 Encounters:  12/13/22 (!) 360 lb (163.3 kg)  10/16/22 (!) 360 lb 12.8 oz (163.7 kg)  10/05/22 (!) 364 lb (165.1 kg)    Physical Exam Vitals reviewed.  HENT:     Head: Normocephalic and atraumatic.  Eyes:     Pupils: Pupils are equal, round, and reactive to light.  Cardiovascular:     Rate and Rhythm: Normal rate and regular rhythm.     Heart sounds: Normal heart sounds.  Pulmonary:     Effort: Pulmonary effort is normal.     Breath sounds: Normal breath sounds.  Abdominal:     General: Bowel sounds are normal.     Palpations: Abdomen is soft.  Musculoskeletal:        General: No tenderness or deformity. Normal range of motion.     Cervical back: Normal range of motion.      Comments: His extremities does show significant edema.  He has more swelling in the left leg and the right leg.  He does have pitting edema in the left leg.  I really cannot palpate a venous cord.  Lymphadenopathy:     Cervical: No cervical adenopathy.  Skin:    General: Skin is warm and dry.     Findings: No erythema or rash.  Neurological:     Mental Status: He is alert and oriented to person, place, and time.  Psychiatric:        Behavior: Behavior normal.        Thought Content: Thought content normal.        Judgment: Judgment normal.      Lab Results  Component Value Date   WBC 6.8 12/13/2022   HGB 14.9 12/13/2022   HCT 43.0 12/13/2022   MCV 92.5 12/13/2022   PLT 202 12/13/2022     Chemistry      Component Value Date/Time   NA 139 12/13/2022 1339   NA 143 05/31/2017 1502   NA 140 01/25/2017 0958   K 4.1 12/13/2022 1339   K 3.9 05/31/2017 1502   K 3.9 01/25/2017 0958   CL 102 12/13/2022 1339   CL 104 05/31/2017 1502   CO2 29 12/13/2022 1339   CO2 32 05/31/2017 1502   CO2 26 01/25/2017 0958   BUN 14 12/13/2022 1339   BUN 17 05/31/2017 1502   BUN 19.6 01/25/2017 0958   CREATININE 1.00 12/13/2022 1339   CREATININE 1.2 05/31/2017 1502   CREATININE 1.0 01/25/2017 0958      Component Value Date/Time   CALCIUM 9.4 12/13/2022 1339   CALCIUM 9.6 05/31/2017 1502   CALCIUM 9.0 01/25/2017 0958   ALKPHOS 84 12/13/2022 1339   ALKPHOS 91 (H) 05/31/2017 1502   ALKPHOS 85 01/25/2017 0958   AST 19 12/13/2022 1339   AST 19 01/25/2017 0958   ALT 21 12/13/2022 1339   ALT 34 05/31/2017 1502   ALT 24 01/25/2017 0958   BILITOT 0.7 12/13/2022 1339   BILITOT 0.82 01/25/2017 0958       Impression and Plan: [name] is a very nice 62 year old white male.  He recently had his 61st birthday.  He developed another thrombus back in November 2023.  He now is on lifelong anticoagulation.  I am glad that the Doppler showed that the thrombus seems to be resolving a little bit.   It is nonocclusive.  For right now, I just do not think that we need another Doppler unless there is some changes in the leg.  He is can be on lifelong anticoagulation.  I think we can probably get him back now after [facility] day.  We  try to move his appointments out longer and longer.   Volanda Napoleon, MD 2/29/20243:30 PM

## 2023-01-31 ENCOUNTER — Other Ambulatory Visit: Payer: Self-pay | Admitting: Medical

## 2023-01-31 ENCOUNTER — Other Ambulatory Visit: Payer: Self-pay | Admitting: Family Medicine

## 2023-01-31 DIAGNOSIS — I82492 Acute embolism and thrombosis of other specified deep vein of left lower extremity: Secondary | ICD-10-CM

## 2023-02-25 NOTE — Patient Instructions (Incomplete)
Good to see you again today, I will be in touch with your labs ASAP I do suggest getting the Shingrix series Also consider getting a coronary calcium score to evaluate degree of plaque build- up in your heart arteries

## 2023-02-25 NOTE — Progress Notes (Unsigned)
[facility] [address] [place], [place] [address] [phone]  Date:  02/27/2023   Name:  [name]   DOB:  01/08/1961   MRN:  [id number]  PCP:  Pearline Cables, MD    Chief Complaint: No chief complaint on file.   History of Present Illness:  [name] is a 62 y.o. very pleasant male patient who presents with the following:  Patient seen today for periodic follow-up Most recent visit with myself was in November- history of hypertension, DVT, hypothyroidism, sleep apnea, obesity, prediabetes, gout  Last fall he had cellulitis of his left leg complicated by a DVT-this was his second lifetime DVT  At our last visit in November his A1c was over 6.5-we will recheck this today, current diagnosis is prediabetes  Most recent visit with hematology at the end of February-plan for lifelong anticoagulation due to recurrent thromboembolism.  He is currently on Eliquis twice daily  Offer Shingrix Colon cancer screening up-to-date PSA up-to-date Can offer coronary calcium Lab Results  Component Value Date   HGBA1C 6.5 08/29/2022   Lab Results  Component Value Date   PSA 0.13 08/29/2022   PSA 0.18 08/30/2021   PSA 0.20 09/01/2020      Patient Active Problem List   Diagnosis Date Noted   Acute gout of right knee 07/04/2022   Prediabetes 02/14/2020   Acute deep vein thrombosis (DVT) of left lower extremity (HCC) 02/05/2016   Acute pulmonary embolism (HCC) 02/04/2016   Lung nodule 02/04/2016   Obesity 02/04/2016   Idiopathic hypothyroidism 02/09/2015   Edema of both legs 02/09/2015   Sleep apnea 03/21/2013   Hypertension, benign 03/21/2013    Past Medical History:  Diagnosis Date   Deep vein thrombosis (DVT) of left lower extremity (HCC)    GERD (gastroesophageal reflux disease)    History of chicken pox    Hypertension    Measles    Pulmonary embolism (HCC)    Sleep apnea    Thyroid  disease    Unfounded    Past Surgical History:  Procedure Laterality Date   COLONOSCOPY WITH PROPOFOL N/A 11/29/2016   Procedure: COLONOSCOPY WITH PROPOFOL;  Surgeon: Bernette Redbird, MD;  Location: [facility];  Service: Endoscopy;  Laterality: N/A;   FOOT SURGERY     Chain saw accident    Social History   Tobacco Use   Smoking status: Never   Smokeless tobacco: Never  Vaping Use   Vaping Use: Never used  Substance Use Topics   Alcohol use: No    Alcohol/week: 0.0 standard drinks of alcohol   Drug use: No    Family History  Problem Relation Age of Onset   Hypertension Father        Living   Heart disease Mother        Living   Pulmonary embolism Mother    Anemia Mother    Hypertension Other        Paternal Side   Hypertension Sister        x2   Migraines Sister    Allergies Son        x1    No Known Allergies  Medication list has been reviewed and updated.  Current Outpatient Medications on File Prior to Visit  Medication Sig Dispense Refill   allopurinol (ZYLOPRIM) 100 MG tablet Take 2 tablets (200 mg total) by mouth daily. 180 tablet 3   benzonatate (TESSALON)  100 MG capsule Take 1 capsule (100 mg total) by mouth 3 (three) times daily as needed for cough. (Patient not taking: Reported on 12/13/2022) 30 capsule 0   carvedilol (COREG) 12.5 MG tablet Take 1 tablet (12.5 mg total) by mouth daily. 90 tablet 1   ELIQUIS 5 MG TABS tablet TAKE 1 TABLET BY MOUTH 2 TIMES A DAY 60 tablet 2   fluticasone (FLONASE) 50 MCG/ACT nasal spray USE 2 SPRAYS IN EACH NOSTRIL ONCE A DAY 16 g 1   furosemide (LASIX) 40 MG tablet TAKE 1 TABLET BY MOUTH 2 TIMES A DAY 180 tablet 1   levothyroxine (SYNTHROID) 50 MCG tablet TAKE 1 TABLET BY MOUTH EVERY DAY BEFORE BREAKFAST 90 tablet 1   losartan-hydrochlorothiazide (HYZAAR) 100-12.5 MG tablet TAKE 1 TABLET BY MOUTH EVERY DAY 90 tablet 1   potassium chloride SA (KLOR-CON M) 20 MEQ tablet TAKE 1 TABLET BY MOUTH EVERY DAY 90 tablet 1   No  current facility-administered medications on file prior to visit.    Review of Systems:  As per HPI- otherwise negative.   Physical Examination: There were no vitals filed for this visit. There were no vitals filed for this visit. There is no height or weight on file to calculate BMI. Ideal Body Weight:    GEN: no acute distress. HEENT: Atraumatic, Normocephalic.  Ears and Nose: No external deformity. CV: RRR, No M/G/R. No JVD. No thrill. No extra heart sounds. PULM: CTA B, no wheezes, crackles, rhonchi. No retractions. No resp. distress. No accessory muscle use. ABD: S, NT, ND, +BS. No rebound. No HSM. EXTR: No c/c/e PSYCH: Normally interactive. Conversant.    Assessment and Plan: ***  Signed Abbe Amsterdam, MD

## 2023-02-27 ENCOUNTER — Ambulatory Visit: Payer: BC Managed Care – PPO | Admitting: Family Medicine

## 2023-02-27 VITALS — BP 140/82 | HR 63 | Temp 98.1°F | Resp 18 | Ht 73.0 in | Wt 355.8 lb

## 2023-02-27 DIAGNOSIS — E039 Hypothyroidism, unspecified: Secondary | ICD-10-CM

## 2023-02-27 DIAGNOSIS — I10 Essential (primary) hypertension: Secondary | ICD-10-CM | POA: Diagnosis not present

## 2023-02-27 DIAGNOSIS — I82409 Acute embolism and thrombosis of unspecified deep veins of unspecified lower extremity: Secondary | ICD-10-CM

## 2023-02-27 DIAGNOSIS — Z6841 Body Mass Index (BMI) 40.0 and over, adult: Secondary | ICD-10-CM | POA: Diagnosis not present

## 2023-02-27 DIAGNOSIS — R7303 Prediabetes: Secondary | ICD-10-CM | POA: Diagnosis not present

## 2023-02-27 DIAGNOSIS — H6123 Impacted cerumen, bilateral: Secondary | ICD-10-CM | POA: Diagnosis not present

## 2023-02-28 LAB — TSH: TSH: 2.8 u[IU]/mL (ref 0.35–5.50)

## 2023-02-28 LAB — HEMOGLOBIN A1C: Hgb A1c MFr Bld: 6.4 % (ref 4.6–6.5)

## 2023-03-04 ENCOUNTER — Other Ambulatory Visit: Payer: Self-pay | Admitting: Family Medicine

## 2023-03-04 DIAGNOSIS — E039 Hypothyroidism, unspecified: Secondary | ICD-10-CM

## 2023-03-04 DIAGNOSIS — R7989 Other specified abnormal findings of blood chemistry: Secondary | ICD-10-CM

## 2023-03-04 DIAGNOSIS — I10 Essential (primary) hypertension: Secondary | ICD-10-CM

## 2023-04-12 ENCOUNTER — Inpatient Hospital Stay: Payer: BC Managed Care – PPO | Attending: Hematology & Oncology

## 2023-04-12 ENCOUNTER — Inpatient Hospital Stay: Payer: BC Managed Care – PPO | Admitting: Hematology & Oncology

## 2023-04-12 ENCOUNTER — Encounter: Payer: Self-pay | Admitting: Hematology & Oncology

## 2023-04-12 ENCOUNTER — Other Ambulatory Visit: Payer: Self-pay

## 2023-04-12 VITALS — BP 124/82 | HR 70 | Temp 98.6°F | Resp 18 | Ht 73.0 in | Wt 353.1 lb

## 2023-04-12 DIAGNOSIS — I82462 Acute embolism and thrombosis of left calf muscular vein: Secondary | ICD-10-CM | POA: Diagnosis present

## 2023-04-12 DIAGNOSIS — I2609 Other pulmonary embolism with acute cor pulmonale: Secondary | ICD-10-CM

## 2023-04-12 DIAGNOSIS — Z86711 Personal history of pulmonary embolism: Secondary | ICD-10-CM | POA: Diagnosis not present

## 2023-04-12 DIAGNOSIS — Z7901 Long term (current) use of anticoagulants: Secondary | ICD-10-CM | POA: Insufficient documentation

## 2023-04-12 DIAGNOSIS — I82402 Acute embolism and thrombosis of unspecified deep veins of left lower extremity: Secondary | ICD-10-CM

## 2023-04-12 LAB — CBC WITH DIFFERENTIAL (CANCER CENTER ONLY)
Abs Immature Granulocytes: 0.02 10*3/uL (ref 0.00–0.07)
Basophils Absolute: 0 10*3/uL (ref 0.0–0.1)
Basophils Relative: 1 %
Eosinophils Absolute: 0.4 10*3/uL (ref 0.0–0.5)
Eosinophils Relative: 5 %
HCT: 44.3 % (ref 39.0–52.0)
Hemoglobin: 15 g/dL (ref 13.0–17.0)
Immature Granulocytes: 0 %
Lymphocytes Relative: 28 %
Lymphs Abs: 2.3 10*3/uL (ref 0.7–4.0)
MCH: 32.4 pg (ref 26.0–34.0)
MCHC: 33.9 g/dL (ref 30.0–36.0)
MCV: 95.7 fL (ref 80.0–100.0)
Monocytes Absolute: 0.8 10*3/uL (ref 0.1–1.0)
Monocytes Relative: 10 %
Neutro Abs: 4.6 10*3/uL (ref 1.7–7.7)
Neutrophils Relative %: 56 %
Platelet Count: 215 10*3/uL (ref 150–400)
RBC: 4.63 MIL/uL (ref 4.22–5.81)
RDW: 12.2 % (ref 11.5–15.5)
WBC Count: 8.2 10*3/uL (ref 4.0–10.5)
nRBC: 0 % (ref 0.0–0.2)

## 2023-04-12 LAB — CMP (CANCER CENTER ONLY)
ALT: 18 U/L (ref 0–44)
AST: 17 U/L (ref 15–41)
Albumin: 4.6 g/dL (ref 3.5–5.0)
Alkaline Phosphatase: 87 U/L (ref 38–126)
Anion gap: 9 (ref 5–15)
BUN: 26 mg/dL — ABNORMAL HIGH (ref 8–23)
CO2: 33 mmol/L — ABNORMAL HIGH (ref 22–32)
Calcium: 9.6 mg/dL (ref 8.9–10.3)
Chloride: 100 mmol/L (ref 98–111)
Creatinine: 1.33 mg/dL — ABNORMAL HIGH (ref 0.61–1.24)
GFR, Estimated: >60 mL/min (ref >60)
Glucose, Bld: 108 mg/dL — ABNORMAL HIGH (ref 70–99)
Potassium: 4.3 mmol/L (ref 3.5–5.1)
Sodium: 142 mmol/L (ref 135–145)
Total Bilirubin: 0.6 mg/dL (ref 0.3–1.2)
Total Protein: 7.6 g/dL (ref 6.5–8.1)

## 2023-04-12 LAB — D-DIMER, QUANTITATIVE: D-Dimer, Quant: 0.35 ug/mL-FEU (ref 0.00–0.50)

## 2023-04-12 NOTE — Progress Notes (Signed)
Hematology and Oncology Follow Up Visit  [name] [id number] [birth year] 62 y.o. 04/12/2023   Principle Diagnosis:  Acute thrombus in left gastrocnemius vein --recurrent thromboembolic disease-past history of pulmonary embolus and left leg DVT --idiopathic  Current Therapy:   Eliquis 5 mg p.o. twice daily-lifelong     Interim History:  [name] is back for follow-up.  We last saw him back in February.  Since then, he still been quite busy working at [employer].  He has been working about 10 hours a day.  He has had no problems at all.  He is on Eliquis.  He is doing well on the Eliquis.  He has had no bleeding.  He has had no bruising.  There has been no problems with COVID.  He has had no issues with nausea or vomiting.  He has had no rashes.  He has had no headache.  Overall, I would say that his performance status is probably ECOG 0.   Medications:  Current Outpatient Medications:    allopurinol (ZYLOPRIM) 100 MG tablet, Take 2 tablets (200 mg total) by mouth daily., Disp: 180 tablet, Rfl: 3   carvedilol (COREG) 12.5 MG tablet, Take 1 tablet (12.5 mg total) by mouth daily., Disp: 90 tablet, Rfl: 1   ELIQUIS 5 MG TABS tablet, TAKE 1 TABLET BY MOUTH 2 TIMES A DAY, Disp: 60 tablet, Rfl: 2   furosemide (LASIX) 40 MG tablet, TAKE 1 TABLET BY MOUTH 2 TIMES A DAY, Disp: 180 tablet, Rfl: 1   levothyroxine (SYNTHROID) 50 MCG tablet, Take 1 tablet (50 mcg total) by mouth daily before breakfast., Disp: 90 tablet, Rfl: 1   losartan-hydrochlorothiazide (HYZAAR) 100-12.5 MG tablet, Take 1 tablet by mouth daily., Disp: 90 tablet, Rfl: 1   potassium chloride SA (KLOR-CON M) 20 MEQ tablet, TAKE 1 TABLET BY MOUTH EVERY DAY, Disp: 90 tablet, Rfl: 1  Allergies: No Known Allergies  Past Medical History, Surgical history, Social history, and Family History were reviewed and updated.  Review of Systems: Review of Systems  Constitutional: Negative.   HENT:  Negative.     Eyes: Negative.   Respiratory: Negative.    Cardiovascular: Negative.   Gastrointestinal: Negative.   Endocrine: Negative.   Genitourinary: Negative.    Musculoskeletal:  Positive for arthralgias.  Skin: Negative.   Neurological: Negative.   Hematological: Negative.   Psychiatric/Behavioral: Negative.      Physical Exam:  height is 6\' 1"  (1.854 m) and weight is 353 lb 1.9 oz (160.2 kg) (abnormal). His oral temperature is 98.6 F (37 C). His blood pressure is 124/82 and his pulse is 70. His respiration is 18 and oxygen saturation is 98%.   Wt Readings from Last 3 Encounters:  04/12/23 (!) 353 lb 1.9 oz (160.2 kg)  02/27/23 (!) 355 lb 12.8 oz (161.4 kg)  12/13/22 (!) 360 lb (163.3 kg)    Physical Exam Vitals reviewed.  HENT:     Head: Normocephalic and atraumatic.  Eyes:     Pupils: Pupils are equal, round, and reactive to light.  Cardiovascular:     Rate and Rhythm: Normal rate and regular rhythm.     Heart sounds: Normal heart sounds.  Pulmonary:     Effort: Pulmonary effort is normal.     Breath sounds: Normal breath sounds.  Abdominal:     General: Bowel sounds are normal.     Palpations: Abdomen is soft.  Musculoskeletal:        General: No tenderness or  deformity. Normal range of motion.     Cervical back: Normal range of motion.     Comments: His extremities does show significant edema.  He has more swelling in the left leg and the right leg.  He does have pitting edema in the left leg.  I really cannot palpate a venous cord.  Lymphadenopathy:     Cervical: No cervical adenopathy.  Skin:    General: Skin is warm and dry.     Findings: No erythema or rash.  Neurological:     Mental Status: He is alert and oriented to person, place, and time.  Psychiatric:        Behavior: Behavior normal.        Thought Content: Thought content normal.        Judgment: Judgment normal.     Lab Results  Component Value Date   WBC 8.2 04/12/2023   HGB 15.0 04/12/2023    HCT 44.3 04/12/2023   MCV 95.7 04/12/2023   PLT 215 04/12/2023     Chemistry      Component Value Date/Time   NA 142 04/12/2023 1455   NA 143 05/31/2017 1502   NA 140 01/25/2017 0958   K 4.3 04/12/2023 1455   K 3.9 05/31/2017 1502   K 3.9 01/25/2017 0958   CL 100 04/12/2023 1455   CL 104 05/31/2017 1502   CO2 33 (H) 04/12/2023 1455   CO2 32 05/31/2017 1502   CO2 26 01/25/2017 0958   BUN 26 (H) 04/12/2023 1455   BUN 17 05/31/2017 1502   BUN 19.6 01/25/2017 0958   CREATININE 1.33 (H) 04/12/2023 1455   CREATININE 1.2 05/31/2017 1502   CREATININE 1.0 01/25/2017 0958      Component Value Date/Time   CALCIUM 9.6 04/12/2023 1455   CALCIUM 9.6 05/31/2017 1502   CALCIUM 9.0 01/25/2017 0958   ALKPHOS 87 04/12/2023 1455   ALKPHOS 91 (H) 05/31/2017 1502   ALKPHOS 85 01/25/2017 0958   AST 17 04/12/2023 1455   AST 19 01/25/2017 0958   ALT 18 04/12/2023 1455   ALT 34 05/31/2017 1502   ALT 24 01/25/2017 0958   BILITOT 0.6 04/12/2023 1455   BILITOT 0.82 01/25/2017 0958       Impression and Plan: [name] is a very nice 62 year old white male.  He developed another thrombus back in November 2023.  He now is on lifelong anticoagulation.  Of note, his D-dimer was normal at 0.35.  We will still follow him along.  I do not see that we have to do any Dopplers unless there is any change in the left leg.  Right now, we will plan to get him back in 4 months.  By then, he will be the Fall.  We will have a lot to talk about.   Josph Macho, MD 6/28/20244:05 PM

## 2023-05-06 ENCOUNTER — Other Ambulatory Visit: Payer: Self-pay | Admitting: Family Medicine

## 2023-05-06 DIAGNOSIS — I82492 Acute embolism and thrombosis of other specified deep vein of left lower extremity: Secondary | ICD-10-CM

## 2023-06-15 ENCOUNTER — Other Ambulatory Visit: Payer: Self-pay | Admitting: Family Medicine

## 2023-06-15 DIAGNOSIS — R600 Localized edema: Secondary | ICD-10-CM

## 2023-08-12 ENCOUNTER — Inpatient Hospital Stay: Payer: BC Managed Care – PPO | Attending: Hematology & Oncology

## 2023-08-12 ENCOUNTER — Inpatient Hospital Stay: Payer: BC Managed Care – PPO | Admitting: Hematology & Oncology

## 2023-08-12 VITALS — BP 135/68 | HR 56 | Temp 97.9°F | Resp 18 | Ht 73.0 in | Wt 354.0 lb

## 2023-08-12 DIAGNOSIS — Z7901 Long term (current) use of anticoagulants: Secondary | ICD-10-CM | POA: Insufficient documentation

## 2023-08-12 DIAGNOSIS — I2699 Other pulmonary embolism without acute cor pulmonale: Secondary | ICD-10-CM

## 2023-08-12 DIAGNOSIS — I82402 Acute embolism and thrombosis of unspecified deep veins of left lower extremity: Secondary | ICD-10-CM

## 2023-08-12 DIAGNOSIS — Z86711 Personal history of pulmonary embolism: Secondary | ICD-10-CM | POA: Diagnosis not present

## 2023-08-12 DIAGNOSIS — Z86718 Personal history of other venous thrombosis and embolism: Secondary | ICD-10-CM | POA: Diagnosis present

## 2023-08-12 LAB — CBC WITH DIFFERENTIAL (CANCER CENTER ONLY)
Abs Immature Granulocytes: 0.01 10*3/uL (ref 0.00–0.07)
Basophils Absolute: 0 10*3/uL (ref 0.0–0.1)
Basophils Relative: 1 %
Eosinophils Absolute: 0.4 10*3/uL (ref 0.0–0.5)
Eosinophils Relative: 6 %
HCT: 41.7 % (ref 39.0–52.0)
Hemoglobin: 14.3 g/dL (ref 13.0–17.0)
Immature Granulocytes: 0 %
Lymphocytes Relative: 36 %
Lymphs Abs: 2.2 10*3/uL (ref 0.7–4.0)
MCH: 32.6 pg (ref 26.0–34.0)
MCHC: 34.3 g/dL (ref 30.0–36.0)
MCV: 95 fL (ref 80.0–100.0)
Monocytes Absolute: 0.6 10*3/uL (ref 0.1–1.0)
Monocytes Relative: 10 %
Neutro Abs: 2.9 10*3/uL (ref 1.7–7.7)
Neutrophils Relative %: 47 %
Platelet Count: 209 10*3/uL (ref 150–400)
RBC: 4.39 MIL/uL (ref 4.22–5.81)
RDW: 12.2 % (ref 11.5–15.5)
WBC Count: 6.1 10*3/uL (ref 4.0–10.5)
nRBC: 0 % (ref 0.0–0.2)

## 2023-08-12 LAB — CMP (CANCER CENTER ONLY)
ALT: 19 U/L (ref 0–44)
AST: 22 U/L (ref 15–41)
Albumin: 4.5 g/dL (ref 3.5–5.0)
Alkaline Phosphatase: 80 U/L (ref 38–126)
Anion gap: 9 (ref 5–15)
BUN: 23 mg/dL (ref 8–23)
CO2: 31 mmol/L (ref 22–32)
Calcium: 9.6 mg/dL (ref 8.9–10.3)
Chloride: 103 mmol/L (ref 98–111)
Creatinine: 1.15 mg/dL (ref 0.61–1.24)
GFR, Estimated: >60 mL/min (ref >60)
Glucose, Bld: 116 mg/dL — ABNORMAL HIGH (ref 70–99)
Potassium: 4.2 mmol/L (ref 3.5–5.1)
Sodium: 143 mmol/L (ref 135–145)
Total Bilirubin: 0.7 mg/dL (ref 0.3–1.2)
Total Protein: 6.7 g/dL (ref 6.5–8.1)

## 2023-08-12 LAB — D-DIMER, QUANTITATIVE: D-Dimer, Quant: <0.27 ug{FEU}/mL (ref 0.00–0.50)

## 2023-08-12 NOTE — Progress Notes (Signed)
Hematology and Oncology Follow Up Visit  [name] [id number] 1961-07-26 62 y.o. 08/12/2023   Principle Diagnosis:  Acute thrombus in left gastrocnemius vein --recurrent thromboembolic disease-past history of pulmonary embolus and left leg DVT --idiopathic  Current Therapy:   Eliquis 5 mg p.o. twice daily-lifelong     Interim History:  [name] is back for follow-up.  We see him every 6 months.  So far, has been quite busy at the [employer].  He tells me all about his work.  He did not have much of a garden this summer.  Apparently, deer were quite busy eating what he planted.  He does have the chronic swelling in his left leg.  He does wear stockings.  He is on the Eliquis and doing fairly well on Eliquis.  He has had no bleeding.  He has had no cough or chest wall pain.  He has had no nausea or vomiting.  He has had no headache.  Overall, I would have said that his performance status is probably ECOG 0.   Medications:  Current Outpatient Medications:    allopurinol (ZYLOPRIM) 100 MG tablet, Take 2 tablets (200 mg total) by mouth daily., Disp: 180 tablet, Rfl: 3   carvedilol (COREG) 12.5 MG tablet, Take 1 tablet (12.5 mg total) by mouth daily., Disp: 90 tablet, Rfl: 1   ELIQUIS 5 MG TABS tablet, TAKE 1 TABLET BY MOUTH 2 TIMES A DAY, Disp: 60 tablet, Rfl: 2   furosemide (LASIX) 40 MG tablet, TAKE 1 TABLET BY MOUTH 2 TIMES A DAY, Disp: 180 tablet, Rfl: 1   levothyroxine (SYNTHROID) 50 MCG tablet, Take 1 tablet (50 mcg total) by mouth daily before breakfast., Disp: 90 tablet, Rfl: 1   losartan-hydrochlorothiazide (HYZAAR) 100-12.5 MG tablet, Take 1 tablet by mouth daily., Disp: 90 tablet, Rfl: 1   potassium chloride SA (KLOR-CON M) 20 MEQ tablet, TAKE 1 TABLET BY MOUTH EVERY DAY, Disp: 90 tablet, Rfl: 1  Allergies: No Known Allergies  Past Medical History, Surgical history, Social history, and Family History were reviewed and updated.  Review of  Systems: Review of Systems  Constitutional: Negative.   HENT:  Negative.    Eyes: Negative.   Respiratory: Negative.    Cardiovascular: Negative.   Gastrointestinal: Negative.   Endocrine: Negative.   Genitourinary: Negative.    Musculoskeletal:  Positive for arthralgias.  Skin: Negative.   Neurological: Negative.   Hematological: Negative.   Psychiatric/Behavioral: Negative.      Physical Exam:  height is 6\' 1"  (1.854 m) and weight is 354 lb (160.6 kg) (abnormal). His blood pressure is 135/68 and his pulse is 56 (abnormal). His respiration is 18 and oxygen saturation is 100%.   Wt Readings from Last 3 Encounters:  08/12/23 (!) 354 lb (160.6 kg)  04/12/23 (!) 353 lb 1.9 oz (160.2 kg)  02/27/23 (!) 355 lb 12.8 oz (161.4 kg)    Physical Exam Vitals reviewed.  HENT:     Head: Normocephalic and atraumatic.  Eyes:     Pupils: Pupils are equal, round, and reactive to light.  Cardiovascular:     Rate and Rhythm: Normal rate and regular rhythm.     Heart sounds: Normal heart sounds.  Pulmonary:     Effort: Pulmonary effort is normal.     Breath sounds: Normal breath sounds.  Abdominal:     General: Bowel sounds are normal.     Palpations: Abdomen is soft.  Musculoskeletal:  General: No tenderness or deformity. Normal range of motion.     Cervical back: Normal range of motion.     Comments: His extremities does show significant edema.  He has more swelling in the left leg and the right leg.  He does have pitting edema in the left leg.  I really cannot palpate a venous cord.  Lymphadenopathy:     Cervical: No cervical adenopathy.  Skin:    General: Skin is warm and dry.     Findings: No erythema or rash.  Neurological:     Mental Status: He is alert and oriented to person, place, and time.  Psychiatric:        Behavior: Behavior normal.        Thought Content: Thought content normal.        Judgment: Judgment normal.     Lab Results  Component Value Date   WBC  6.1 08/12/2023   HGB 14.3 08/12/2023   HCT 41.7 08/12/2023   MCV 95.0 08/12/2023   PLT 209 08/12/2023     Chemistry      Component Value Date/Time   NA 143 08/12/2023 1453   NA 143 05/31/2017 1502   NA 140 01/25/2017 0958   K 4.2 08/12/2023 1453   K 3.9 05/31/2017 1502   K 3.9 01/25/2017 0958   CL 103 08/12/2023 1453   CL 104 05/31/2017 1502   CO2 31 08/12/2023 1453   CO2 32 05/31/2017 1502   CO2 26 01/25/2017 0958   BUN 23 08/12/2023 1453   BUN 17 05/31/2017 1502   BUN 19.6 01/25/2017 0958   CREATININE 1.15 08/12/2023 1453   CREATININE 1.2 05/31/2017 1502   CREATININE 1.0 01/25/2017 0958      Component Value Date/Time   CALCIUM 9.6 08/12/2023 1453   CALCIUM 9.6 05/31/2017 1502   CALCIUM 9.0 01/25/2017 0958   ALKPHOS 80 08/12/2023 1453   ALKPHOS 91 (H) 05/31/2017 1502   ALKPHOS 85 01/25/2017 0958   AST 22 08/12/2023 1453   AST 19 01/25/2017 0958   ALT 19 08/12/2023 1453   ALT 34 05/31/2017 1502   ALT 24 01/25/2017 0958   BILITOT 0.7 08/12/2023 1453   BILITOT 0.82 01/25/2017 0958       Impression and Plan: [name] is a very nice 62 year old white male.  He developed another thrombus back in November 2023.  He now is on lifelong anticoagulation.  Of note, his D-dimer was normal at < 0.27.  We will still follow him along.  I do not see that we have to do any Dopplers unless there is any change in the left leg.  Right now, we will plan to get him back in 6 months.  We will get him through the [place] season and Winter.    Josph Macho, MD 10/28/20243:49 PM

## 2023-08-19 ENCOUNTER — Other Ambulatory Visit: Payer: Self-pay | Admitting: Family Medicine

## 2023-08-19 DIAGNOSIS — I82492 Acute embolism and thrombosis of other specified deep vein of left lower extremity: Secondary | ICD-10-CM

## 2023-08-19 DIAGNOSIS — M10079 Idiopathic gout, unspecified ankle and foot: Secondary | ICD-10-CM

## 2023-08-31 NOTE — Progress Notes (Unsigned)
[facility] [address], [address] [place], [place] [address] [phone]  Date:  09/02/2023   Name:  [name]   DOB:  June 14, 1961   MRN:  [id number]  PCP:  Pearline Cables, MD    Chief Complaint: No chief complaint on file.   History of Present Illness:  [name] is a 62 y.o. very pleasant male patient who presents with the following:  Pt seen today for routine recheck Last seen by myself in May-  history of hypertension, DVT, hypothyroidism, sleep apnea, obesity, prediabetes, gout  He was seen by hematology most recently in October- he is maintained on rx of Eliquis  Principle Diagnosis:  Acute thrombus in left gastrocnemius vein --recurrent thromboembolic disease-past history of pulmonary embolus and left leg DVT --idiopathic Current Therapy:        Eliquis 5 mg p.o. twice daily-lifelong  Shingirx Flu shot Colon UTD  Lab Results  Component Value Date   HGBA1C 6.4 02/27/2023    Lab Results  Component Value Date   TSH 2.80 02/27/2023    Patient Active Problem List   Diagnosis Date Noted   Acute gout of right knee 07/04/2022   Prediabetes 02/14/2020   Acute deep vein thrombosis (DVT) of left lower extremity (HCC) 02/05/2016   Acute pulmonary embolism (HCC) 02/04/2016   Lung nodule 02/04/2016   Obesity 02/04/2016   Idiopathic hypothyroidism 02/09/2015   Edema of both legs 02/09/2015   Sleep apnea 03/21/2013   Hypertension, benign 03/21/2013    Past Medical History:  Diagnosis Date   Deep vein thrombosis (DVT) of left lower extremity (HCC)    GERD (gastroesophageal reflux disease)    History of chicken pox    Hypertension    Measles    Pulmonary embolism (HCC)    Sleep apnea    Thyroid disease    Unfounded    Past Surgical History:  Procedure Laterality Date   COLONOSCOPY WITH PROPOFOL N/A 11/29/2016   Procedure: COLONOSCOPY WITH PROPOFOL;  Surgeon: Bernette Redbird, MD;   Location: [facility];  Service: Endoscopy;  Laterality: N/A;   FOOT SURGERY     Chain saw accident    Social History   Tobacco Use   Smoking status: Never   Smokeless tobacco: Never  Vaping Use   Vaping status: Never Used  Substance Use Topics   Alcohol use: No    Alcohol/week: 0.0 standard drinks of alcohol   Drug use: No    Family History  Problem Relation Age of Onset   Hypertension Father        Living   Heart disease Mother        Living   Pulmonary embolism Mother    Anemia Mother    Hypertension Other        Paternal Side   Hypertension Sister        x2   Migraines Sister    Allergies Son        x1    No Known Allergies  Medication list has been reviewed and updated.  Current Outpatient Medications on File Prior to Visit  Medication Sig Dispense Refill   allopurinol (ZYLOPRIM) 100 MG tablet Take 2 tablets (200 mg total) by mouth daily. 180 tablet 0   apixaban (ELIQUIS) 5 MG TABS tablet Take 1 tablet (5 mg total) by mouth 2 (two) times daily. 180 tablet 0   carvedilol (COREG) 12.5 MG tablet Take 1 tablet (  12.5 mg total) by mouth daily. 90 tablet 1   furosemide (LASIX) 40 MG tablet TAKE 1 TABLET BY MOUTH 2 TIMES A DAY 180 tablet 1   levothyroxine (SYNTHROID) 50 MCG tablet Take 1 tablet (50 mcg total) by mouth daily before breakfast. 90 tablet 1   losartan-hydrochlorothiazide (HYZAAR) 100-12.5 MG tablet Take 1 tablet by mouth daily. 90 tablet 1   potassium chloride SA (KLOR-CON M) 20 MEQ tablet TAKE 1 TABLET BY MOUTH EVERY DAY 90 tablet 1   No current facility-administered medications on file prior to visit.    Review of Systems:  As per HPI- otherwise negative.   Physical Examination: There were no vitals filed for this visit. There were no vitals filed for this visit. There is no height or weight on file to calculate BMI. Ideal Body Weight:    GEN: no acute distress. HEENT: Atraumatic, Normocephalic.  Ears and Nose: No external deformity. CV:  RRR, No M/G/R. No JVD. No thrill. No extra heart sounds. PULM: CTA B, no wheezes, crackles, rhonchi. No retractions. No resp. distress. No accessory muscle use. ABD: S, NT, ND, +BS. No rebound. No HSM. EXTR: No c/c/e PSYCH: Normally interactive. Conversant.    Assessment and Plan: ***  Signed Abbe Amsterdam, MD

## 2023-09-02 ENCOUNTER — Ambulatory Visit: Payer: BC Managed Care – PPO | Admitting: Family Medicine

## 2023-09-02 VITALS — BP 130/72 | HR 66 | Temp 97.6°F | Resp 18 | Ht 73.0 in | Wt 359.4 lb

## 2023-09-02 DIAGNOSIS — I10 Essential (primary) hypertension: Secondary | ICD-10-CM

## 2023-09-02 DIAGNOSIS — I82409 Acute embolism and thrombosis of unspecified deep veins of unspecified lower extremity: Secondary | ICD-10-CM

## 2023-09-02 DIAGNOSIS — E039 Hypothyroidism, unspecified: Secondary | ICD-10-CM

## 2023-09-02 DIAGNOSIS — R7303 Prediabetes: Secondary | ICD-10-CM

## 2023-09-02 DIAGNOSIS — Z125 Encounter for screening for malignant neoplasm of prostate: Secondary | ICD-10-CM

## 2023-09-02 DIAGNOSIS — Z13220 Encounter for screening for lipoid disorders: Secondary | ICD-10-CM | POA: Diagnosis not present

## 2023-09-02 NOTE — Patient Instructions (Signed)
It was good to see you again today- best of luck with taking care of your mom and dad I will be in touch with your labs asap Please let me know if you would like me to get a CT scan of your abdomen to make sure there is no mass or anything else amiss  Assuming all is well please see me in about 6 months   Recommend shingles vaccine series, seasonal flu shot

## 2023-09-03 LAB — LIPID PANEL
Cholesterol: 171 mg/dL (ref 0–200)
HDL: 45.2 mg/dL (ref >39.00)
LDL Cholesterol: 102 mg/dL — ABNORMAL HIGH (ref 0–99)
NonHDL: 125.38
Total CHOL/HDL Ratio: 4
Triglycerides: 118 mg/dL (ref 0.0–149.0)
VLDL: 23.6 mg/dL (ref 0.0–40.0)

## 2023-09-03 LAB — HEMOGLOBIN A1C: Hgb A1c MFr Bld: 6.6 % — ABNORMAL HIGH (ref 4.6–6.5)

## 2023-09-03 LAB — PSA: PSA: 0.23 ng/mL (ref 0.10–4.00)

## 2023-09-03 LAB — TSH: TSH: 2.78 u[IU]/mL (ref 0.35–5.50)

## 2023-09-19 ENCOUNTER — Other Ambulatory Visit: Payer: Self-pay | Admitting: Family Medicine

## 2023-09-19 DIAGNOSIS — I10 Essential (primary) hypertension: Secondary | ICD-10-CM

## 2023-09-19 DIAGNOSIS — R7989 Other specified abnormal findings of blood chemistry: Secondary | ICD-10-CM

## 2023-09-19 DIAGNOSIS — E039 Hypothyroidism, unspecified: Secondary | ICD-10-CM

## 2023-11-25 ENCOUNTER — Other Ambulatory Visit: Payer: Self-pay | Admitting: Family Medicine

## 2023-11-25 DIAGNOSIS — I82492 Acute embolism and thrombosis of other specified deep vein of left lower extremity: Secondary | ICD-10-CM

## 2023-11-25 DIAGNOSIS — M10079 Idiopathic gout, unspecified ankle and foot: Secondary | ICD-10-CM

## 2023-12-23 ENCOUNTER — Other Ambulatory Visit: Payer: Self-pay | Admitting: Family Medicine

## 2023-12-23 DIAGNOSIS — R600 Localized edema: Secondary | ICD-10-CM

## 2024-01-15 ENCOUNTER — Ambulatory Visit: Admitting: Family

## 2024-01-15 VITALS — BP 130/60 | HR 71 | Temp 98.4°F | Resp 16 | Ht 73.0 in | Wt 357.0 lb

## 2024-01-15 DIAGNOSIS — R32 Unspecified urinary incontinence: Secondary | ICD-10-CM | POA: Diagnosis not present

## 2024-01-15 LAB — POC URINALSYSI DIPSTICK (AUTOMATED)
Glucose, UA: NEGATIVE
Ketones, UA: NEGATIVE
Nitrite, UA: NEGATIVE
Protein, UA: POSITIVE — AB
Spec Grav, UA: 1.015 (ref 1.010–1.025)
Urobilinogen, UA: 0.2 U/dL
pH, UA: 6 (ref 5.0–8.0)

## 2024-01-15 LAB — BASIC METABOLIC PANEL WITH GFR
BUN: 17 mg/dL (ref 6–23)
CO2: 29 meq/L (ref 19–32)
Calcium: 8.8 mg/dL (ref 8.4–10.5)
Chloride: 101 meq/L (ref 96–112)
Creatinine, Ser: 1.02 mg/dL (ref 0.40–1.50)
GFR: 78.86 mL/min (ref >60.00)
Glucose, Bld: 137 mg/dL — ABNORMAL HIGH (ref 70–99)
Potassium: 4.2 meq/L (ref 3.5–5.1)
Sodium: 138 meq/L (ref 135–145)

## 2024-01-15 LAB — PSA: PSA: 1.75 ng/mL (ref 0.10–4.00)

## 2024-01-15 MED ORDER — TAMSULOSIN HCL 0.4 MG PO CAPS: 0.4000 mg | ORAL_CAPSULE | Freq: Every day | ORAL | 3 refills | Status: DC

## 2024-01-15 MED ORDER — CIPROFLOXACIN HCL 500 MG PO TABS: 500.0000 mg | ORAL_TABLET | Freq: Two times a day (BID) | ORAL | 0 refills | Status: AC

## 2024-01-15 NOTE — Assessment & Plan Note (Addendum)
 Initially suspected urinary retention.  I did call upstairs to Urology to see if they could work him in today, but unfortunately they do not have any spots open. The office did give [place] a Cuvee foley leg bag kit.  Pt was catheterized using stable technique and only 50 cc were obtained.  Removed catheter. Will begin flomax, check PSA and renal function.  UA notes + leuks and blood.  Will begin cipro x 7 days for presumed UTI while we wait on culture results.  Pt is advised to let us know if symptoms are not completely resolved at the end of his antibiotic course.  If symptoms persist or if PSA elevated, consider longer duration of antibiotic treatment to cover for prostatitis.

## 2024-01-15 NOTE — Progress Notes (Signed)
 Subjective:     Patient ID: [name], male    DOB: [birth year], 63 y.o.   MRN: [id number]  Chief Complaint  Patient presents with   Urinary Retention    Patient complains of difficulty urinating. This started yesterday morning     HPI  Discussed the use of AI scribe software for clinical note transcription with the patient, who gave verbal consent to proceed.  History of Present Illness  [name] is a 63 year old male who presents with difficulty urinating.  He has been experiencing difficulty urinating since yesterday morning, with pain before reaching the bathroom and episodes of incontinence, described as 'I done wet myself a little bit.' This issue persisted throughout the day, with a brief episode of pain lasting about five seconds in the evening. He urinated very little today, with the most significant urination occurring upon arrival at the clinic. No sensation of a full bladder or any previous issues with urinary flow. No fever, chills, or hematuria.  He states that he has been taking Lasix for the past seven years. He has a history of DVT's in his left leg, resulting in persistent swelling.     Health Maintenance Due  Topic Date Due   Pneumococcal Vaccine 53-42 Years old (1 of 2 - PCV) Never done   Zoster Vaccines- Shingrix (1 of 2) Never done    Past Medical History:  Diagnosis Date   Deep vein thrombosis (DVT) of left lower extremity (HCC)    GERD (gastroesophageal reflux disease)    History of chicken pox    Hypertension    Measles    Pulmonary embolism (HCC)    Sleep apnea    Thyroid disease    Unfounded    Past Surgical History:  Procedure Laterality Date   COLONOSCOPY WITH PROPOFOL N/A 11/29/2016   Procedure: COLONOSCOPY WITH PROPOFOL;  Surgeon: Bernette Redbird, MD;  Location: [facility];  Service: Endoscopy;  Laterality: N/A;   FOOT SURGERY     Chain saw accident    Family History  Problem Relation Age of Onset   Hypertension  Father        Living   Heart disease Mother        Living   Pulmonary embolism Mother    Anemia Mother    Hypertension Other        Paternal Side   Hypertension Sister        x2   Migraines Sister    Allergies Son        x1    Social History   Socioeconomic History   Marital status: Married    Spouse name: Not on file   Number of children: Not on file   Years of education: Not on file   Highest education level: Not on file  Occupational History   Not on file  Tobacco Use   Smoking status: Never   Smokeless tobacco: Never  Vaping Use   Vaping status: Never Used  Substance and Sexual Activity   Alcohol use: No    Alcohol/week: 0.0 standard drinks of alcohol   Drug use: No   Sexual activity: Not Currently  Other Topics Concern   Not on file  Social History Narrative   Not on file   Social Drivers of Health   Financial Resource Strain: Not on file  Food Insecurity: Not on file  Transportation Needs: Not on file  Physical Activity: Not on file  Stress: Not on file  Social Connections: Not on file  Intimate Partner Violence: Not on file    Outpatient Medications Prior to Visit  Medication Sig Dispense Refill   allopurinol (ZYLOPRIM) 100 MG tablet TAKE 2 TABLETS BY MOUTH DAILY 180 tablet 0   carvedilol (COREG) 12.5 MG tablet Take 1 tablet (12.5 mg total) by mouth daily. 90 tablet 1   ELIQUIS 5 MG TABS tablet TAKE 1 TABLET BY MOUTH 2 TIMES A DAY 180 tablet 0   furosemide (LASIX) 40 MG tablet TAKE 1 TABLET BY MOUTH 2 TIMES A DAY 180 tablet 1   levothyroxine (SYNTHROID) 50 MCG tablet Take 1 tablet (50 mcg total) by mouth daily before breakfast. 90 tablet 1   losartan-hydrochlorothiazide (HYZAAR) 100-12.5 MG tablet Take 1 tablet by mouth daily. 90 tablet 1   potassium chloride SA (KLOR-CON M) 20 MEQ tablet TAKE 1 TABLET BY MOUTH EVERY DAY 90 tablet 1   No facility-administered medications prior to visit.    No Known Allergies  ROS See HPI    Objective:     Physical Exam Constitutional:      General: He is not in acute distress.    Appearance: He is well-developed.  HENT:     Head: Normocephalic and atraumatic.  Cardiovascular:     Rate and Rhythm: Normal rate and regular rhythm.     Heart sounds: No murmur heard. Pulmonary:     Effort: Pulmonary effort is normal. No respiratory distress.     Breath sounds: Normal breath sounds. No wheezing or rales.  Abdominal:     Tenderness: There is no right CVA tenderness or left CVA tenderness.     Comments: No suprapubic tenderness, bladder exam limited by habitus   Skin:    General: Skin is warm and dry.  Neurological:     Mental Status: He is alert and oriented to person, place, and time.  Psychiatric:        Behavior: Behavior normal.        Thought Content: Thought content normal.      BP 130/60 (BP Location: Right Arm, Patient Position: Sitting, Cuff Size: Large)   Pulse 71   Temp 98.4 F (36.9 C) (Oral)   Resp 16   Ht 6\' 1"  (1.854 m)   Wt (!) 357 lb (161.9 kg)   SpO2 97%   BMI 47.10 kg/m  Wt Readings from Last 3 Encounters:  01/15/24 (!) 357 lb (161.9 kg)  09/02/23 (!) 359 lb 6.4 oz (163 kg)  08/12/23 (!) 354 lb (160.6 kg)       Assessment & Plan:   Problem List Items Addressed This Visit       Unprioritized   Urinary incontinence - Primary   Initially suspected urinary retention.  I did call upstairs to Urology to see if they could work him in today, but unfortunately they do not have any spots open. The office did give [place] a Cuvee foley leg bag kit.  Pt was catheterized using stable technique and only 50 cc were obtained.  Removed catheter. Will begin flomax, check PSA and renal function.  UA notes + leuks and blood.  Will begin cipro x 7 days for presumed UTI while we wait on culture results.  Pt is advised to let us know if symptoms are not completely resolved at the end of his antibiotic course.  If symptoms persist or if PSA elevated, consider longer duration of  antibiotic treatment to cover for prostatitis.       Relevant Medications  tamsulosin (FLOMAX) 0.4 MG CAPS capsule   Other Relevant Orders   Urine Culture   Basic Metabolic Panel (BMET)   PSA   Ambulatory referral to Urology   POCT Urinalysis Dipstick (Automated) (Completed)   55 minutes spent on today's visit. Time was spent counseling patient about urinary tract infections and urinary retention, catheterizing patient and also coordinating care with outside office.  I am having York Ram start on tamsulosin and ciprofloxacin. I am also having him maintain his carvedilol, levothyroxine, losartan-hydrochlorothiazide, allopurinol, Eliquis, furosemide, and potassium chloride SA.  Meds ordered this encounter  Medications   tamsulosin (FLOMAX) 0.4 MG CAPS capsule    Sig: Take 1 capsule (0.4 mg total) by mouth daily.    Dispense:  30 capsule    Refill:  3    Supervising Provider:   Danise Edge A [[id number]]   ciprofloxacin (CIPRO) 500 MG tablet    Sig: Take 1 tablet (500 mg total) by mouth 2 (two) times daily for 7 days.    Dispense:  14 tablet    Refill:  0    Supervising Provider:   Danise Edge A [[id number]]

## 2024-01-15 NOTE — Progress Notes (Cosign Needed Addendum)
   Established Patient Office Visit  Subjective   Patient ID: [name], male    DOB: 10-23-1960  Age: 63 y.o. MRN: [id number]  No chief complaint on file.   63 yo patient presents with complaint of dysuria, urinary urgency, urinary frequency, and incontinence for 24 hours. Denies recent sexual encounters and penile discharge. Reports he has never had symptoms like this before.   Review of Systems  Constitutional:  Negative for chills and fever.       States he woke up cold yesterday, but denies chills. States this is unusual for him.  HENT:  Negative for hearing loss and sore throat.   Eyes:  Negative for blurred vision and double vision.  Respiratory:  Negative for cough and sputum production.   Cardiovascular:  Positive for leg swelling. Negative for chest pain.       Chronic left leg swelling since DVT in 2017   Gastrointestinal:  Negative for abdominal pain.  Genitourinary:  Positive for dysuria, flank pain, frequency and urgency.       Right flank pain intermittent "for a long time."  Musculoskeletal:  Negative for falls.  Skin:  Negative for rash.  Neurological:  Negative for dizziness and headaches.  Endo/Heme/Allergies:  Does not bruise/bleed easily.  Psychiatric/Behavioral:  Positive for depression. Negative for suicidal ideas.        Situational - recent loss of friend     Objective:    Physical Exam Vitals reviewed. Exam conducted with a chaperone present.  Constitutional:      Appearance: Normal appearance.  HENT:     Head: Normocephalic and atraumatic.     Right Ear: Tympanic membrane normal.     Left Ear: Tympanic membrane normal.  Cardiovascular:     Rate and Rhythm: Normal rate and regular rhythm.     Pulses: Normal pulses.     Heart sounds: Normal heart sounds.  Pulmonary:     Effort: Pulmonary effort is normal.     Breath sounds: Normal breath sounds.  Genitourinary:    Penis: Normal and circumcised.   Musculoskeletal:     Right lower leg:  Edema present.     Left lower leg: Edema present.  Skin:    General: Skin is warm and dry.     Capillary Refill: Capillary refill takes less than 2 seconds.  Neurological:     Mental Status: He is alert and oriented to person, place, and time.  Psychiatric:        Mood and Affect: Mood normal.        Behavior: Behavior normal.      Assessment & Plan:   -Urinary incontinence - inserted Coude urinary catheter to assess for urinary retention. Patient tolerated well. 50 mL urinary return. Urinary catheter removed. UA C&S, PSA, and BMP in office today. Suspect UTI. Rx for Cipro x 7 days. Encouraged patient to notify office if symptoms persist or worsen beyond Cipro. Will adjust plan of care if PSA elevated and/or culture results indicate need for therapy change.    Cristopher Peru, RN

## 2024-01-15 NOTE — Patient Instructions (Signed)
 Please begin flomax

## 2024-01-17 LAB — URINE CULTURE
MICRO NUMBER:: 16279473
SPECIMEN QUALITY:: ADEQUATE

## 2024-01-20 NOTE — Telephone Encounter (Signed)
 Copied from CRM [id number]. Topic: General - Call Back - No Documentation >> Jan 20, 2024 12:28 PM Higinio Roger wrote: Reason for CRM: Patient is requesting a call from Wilford Corner, CMA to discuss labs.   Callback #: [id number]

## 2024-02-09 NOTE — Progress Notes (Addendum)
 [facility] [address], [address] [place], [place] [address] [phone]  Date:  02/12/2024   Name:  [name]   DOB:  June 21, 1961   MRN:  [id number]  PCP:  Kaylee Partridge, MD    Chief Complaint: Follow-up   History of Present Illness:  [name] is a 63 y.o. very pleasant male patient who presents with the following:  Pt seen today for follow-up Last seen by myself in November - history of hypertension, DVT, hypothyroidism, sleep apnea, obesity, prediabetes, gout  Follows with hematology, he uses Eliquis  for history of DVT  He notes he is feeling well today He was seen with a UTI early this month by Melissa - urine culture was positive  He has never had a UTI in the past He feels that he has recovered fully and has no other symptoms.  Also, he has continued taking Flomax  he was given at the time of his UTI.  He notes this does help him urinate more easily and even seems to have reduced feeling in his legs  BMP, PSA done just recently- otherwise labs done in October   Colon UTD   He has CPAP- he thinks his machine is 63 years old  He needs some supplies but this has been difficult due to age of machine.  I encouraged him to try taking his machine to a medical supply store to see if they can help  Declines shingrix and CT coronary today   Lab Results  Component Value Date   HGBA1C 6.6 (H) 09/02/2023    Patient Active Problem List   Diagnosis Date Noted   Urinary incontinence 01/15/2024   Acute gout of right knee 07/04/2022   Prediabetes 02/14/2020   Acute deep vein thrombosis (DVT) of left lower extremity (HCC) 02/05/2016   Acute pulmonary embolism (HCC) 02/04/2016   Lung nodule 02/04/2016   Obesity 02/04/2016   Idiopathic hypothyroidism 02/09/2015   Edema of both legs 02/09/2015   Sleep apnea 03/21/2013   Hypertension, benign 03/21/2013    Past Medical History:  Diagnosis Date   Deep  vein thrombosis (DVT) of left lower extremity (HCC)    GERD (gastroesophageal reflux disease)    History of chicken pox    Hypertension    Measles    Pulmonary embolism (HCC)    Sleep apnea    Thyroid  disease    Unfounded    Past Surgical History:  Procedure Laterality Date   COLONOSCOPY WITH PROPOFOL  N/A 11/29/2016   Procedure: COLONOSCOPY WITH PROPOFOL ;  Surgeon: Lanita Pitman, MD;  Location: [facility];  Service: Endoscopy;  Laterality: N/A;   FOOT SURGERY     Chain saw accident    Social History   Tobacco Use   Smoking status: Never   Smokeless tobacco: Never  Vaping Use   Vaping status: Never Used  Substance Use Topics   Alcohol use: No    Alcohol/week: 0.0 standard drinks of alcohol   Drug use: No    Family History  Problem Relation Age of Onset   Hypertension Father        Living   Heart disease Mother        Living   Pulmonary embolism Mother    Anemia Mother    Hypertension Other        Paternal Side   Hypertension Sister        x2   Migraines Sister  Allergies Son        x1    No Known Allergies  Medication list has been reviewed and updated.  Current Outpatient Medications on File Prior to Visit  Medication Sig Dispense Refill   allopurinol  (ZYLOPRIM ) 100 MG tablet TAKE 2 TABLETS BY MOUTH DAILY 180 tablet 0   carvedilol  (COREG ) 12.5 MG tablet Take 1 tablet (12.5 mg total) by mouth daily. 90 tablet 1   ELIQUIS  5 MG TABS tablet TAKE 1 TABLET BY MOUTH 2 TIMES A DAY 180 tablet 0   furosemide  (LASIX ) 40 MG tablet TAKE 1 TABLET BY MOUTH 2 TIMES A DAY 180 tablet 1   levothyroxine  (SYNTHROID ) 50 MCG tablet Take 1 tablet (50 mcg total) by mouth daily before breakfast. 90 tablet 1   losartan -hydrochlorothiazide  (HYZAAR) 100-12.5 MG tablet Take 1 tablet by mouth daily. 90 tablet 1   potassium chloride  SA (KLOR-CON  M) 20 MEQ tablet TAKE 1 TABLET BY MOUTH EVERY DAY 90 tablet 1   tamsulosin  (FLOMAX ) 0.4 MG CAPS capsule Take 1 capsule (0.4 mg total) by  mouth daily. 30 capsule 3   No current facility-administered medications on file prior to visit.    Review of Systems:  As per HPI- otherwise negative.   Physical Examination: Vitals:   02/12/24 1534  BP: 136/80  Pulse: 62  Resp: 16  Temp: 98.2 F (36.8 C)  SpO2: 96%   Vitals:   02/12/24 1534  Weight: (!) 364 lb 6.4 oz (165.3 kg)   Body mass index is 48.08 kg/m. Ideal Body Weight:    GEN: no acute distress.  Obese, looks well  HEENT: Atraumatic, Normocephalic.  Ears and Nose: No external deformity. CV: RRR, No M/G/R. No JVD. No thrill. No extra heart sounds. PULM: CTA B, no wheezes, crackles, rhonchi. No retractions. No resp. distress. No accessory muscle use. ABD: S, NT, ND, +BS. No rebound. No HSM. EXTR: No c/c/e PSYCH: Normally interactive. Conversant.    Assessment and Plan: Increased prostate specific antigen (PSA) velocity - Plan: tamsulosin  (FLOMAX ) 0.4 MG CAPS capsule, PSA  Urinary incontinence, unspecified type - Plan: tamsulosin  (FLOMAX ) 0.4 MG CAPS capsule  Prediabetes - Plan: Hemoglobin A1c  Screening for deficiency anemia - Plan: CBC  Patient seen today for follow-up.  His PSA was elevated above his baseline though still in normal range when he had his UTI.  Repeat PSA today He has been doing well with Flomax , can continue this Follow-up on A1c and CBC today Plan for 64-month recheck  Signed Gates Kasal, MD  Received labs as below-this is his second A1c greater than 6.5%, will change prediabetes diagnosis to diabetes  Lab Results  Component Value Date   PSA 0.43 02/12/2024   PSA 1.75 01/15/2024   PSA 0.23 09/02/2023    PSA however has come back down  Letter to patient  Results for orders placed or performed in visit on 02/12/24  Hemoglobin A1c   Collection Time: 02/12/24  4:09 PM  Result Value Ref Range   Hgb A1c MFr Bld 6.7 (H) 4.6 - 6.5 %  CBC   Collection Time: 02/12/24  4:09 PM  Result Value Ref Range   WBC 6.7 4.0 - 10.5  K/uL   RBC 4.23 4.22 - 5.81 Mil/uL   Platelets 198.0 150.0 - 400.0 K/uL   Hemoglobin 13.9 13.0 - 17.0 g/dL   HCT 56.2 13.0 - 86.5 %   MCV 96.6 78.0 - 100.0 fl   MCHC 33.9 30.0 - 36.0 g/dL   RDW  13.0 11.5 - 15.5 %  PSA   Collection Time: 02/12/24  4:09 PM  Result Value Ref Range   PSA 0.43 0.10 - 4.00 ng/mL

## 2024-02-09 NOTE — Patient Instructions (Incomplete)
 It was good to see you today- I will be in touch with your labs asap We can keep you on the flomax  long term if you feel like it helps you   Otherwise let's plan to recheck in 6 months or so b

## 2024-02-10 ENCOUNTER — Inpatient Hospital Stay: Payer: BC Managed Care – PPO | Attending: Hematology & Oncology

## 2024-02-10 ENCOUNTER — Encounter: Payer: Self-pay | Admitting: Hematology & Oncology

## 2024-02-10 ENCOUNTER — Inpatient Hospital Stay: Payer: BC Managed Care – PPO | Admitting: Hematology & Oncology

## 2024-02-10 VITALS — BP 129/74 | HR 65 | Temp 98.0°F | Resp 20 | Ht 73.0 in | Wt 356.0 lb

## 2024-02-10 DIAGNOSIS — I2699 Other pulmonary embolism without acute cor pulmonale: Secondary | ICD-10-CM | POA: Diagnosis not present

## 2024-02-10 DIAGNOSIS — I82462 Acute embolism and thrombosis of left calf muscular vein: Secondary | ICD-10-CM | POA: Diagnosis present

## 2024-02-10 DIAGNOSIS — Z7901 Long term (current) use of anticoagulants: Secondary | ICD-10-CM | POA: Insufficient documentation

## 2024-02-10 DIAGNOSIS — Z87440 Personal history of urinary (tract) infections: Secondary | ICD-10-CM | POA: Diagnosis not present

## 2024-02-10 DIAGNOSIS — Z8619 Personal history of other infectious and parasitic diseases: Secondary | ICD-10-CM | POA: Diagnosis not present

## 2024-02-10 LAB — CMP (CANCER CENTER ONLY)
ALT: 22 U/L (ref 0–44)
AST: 20 U/L (ref 15–41)
Albumin: 4.4 g/dL (ref 3.5–5.0)
Alkaline Phosphatase: 82 U/L (ref 38–126)
Anion gap: 9 (ref 5–15)
BUN: 21 mg/dL (ref 8–23)
CO2: 30 mmol/L (ref 22–32)
Calcium: 9.6 mg/dL (ref 8.9–10.3)
Chloride: 103 mmol/L (ref 98–111)
Creatinine: 1.14 mg/dL (ref 0.61–1.24)
GFR, Estimated: >60 mL/min (ref >60)
Glucose, Bld: 124 mg/dL — ABNORMAL HIGH (ref 70–99)
Potassium: 4.4 mmol/L (ref 3.5–5.1)
Sodium: 142 mmol/L (ref 135–145)
Total Bilirubin: 0.5 mg/dL (ref 0.0–1.2)
Total Protein: 7.4 g/dL (ref 6.5–8.1)

## 2024-02-10 LAB — CBC WITH DIFFERENTIAL (CANCER CENTER ONLY)
Abs Immature Granulocytes: 0.02 10*3/uL (ref 0.00–0.07)
Basophils Absolute: 0 10*3/uL (ref 0.0–0.1)
Basophils Relative: 0 %
Eosinophils Absolute: 0.5 10*3/uL (ref 0.0–0.5)
Eosinophils Relative: 6 %
HCT: 42.2 % (ref 39.0–52.0)
Hemoglobin: 14.5 g/dL (ref 13.0–17.0)
Immature Granulocytes: 0 %
Lymphocytes Relative: 35 %
Lymphs Abs: 2.6 10*3/uL (ref 0.7–4.0)
MCH: 33 pg (ref 26.0–34.0)
MCHC: 34.4 g/dL (ref 30.0–36.0)
MCV: 95.9 fL (ref 80.0–100.0)
Monocytes Absolute: 0.7 10*3/uL (ref 0.1–1.0)
Monocytes Relative: 9 %
Neutro Abs: 3.7 10*3/uL (ref 1.7–7.7)
Neutrophils Relative %: 50 %
Platelet Count: 197 10*3/uL (ref 150–400)
RBC: 4.4 MIL/uL (ref 4.22–5.81)
RDW: 12.2 % (ref 11.5–15.5)
WBC Count: 7.5 10*3/uL (ref 4.0–10.5)
nRBC: 0 % (ref 0.0–0.2)

## 2024-02-10 LAB — D-DIMER, QUANTITATIVE: D-Dimer, Quant: 0.34 ug{FEU}/mL (ref 0.00–0.50)

## 2024-02-10 NOTE — Progress Notes (Signed)
 Hematology and Oncology Follow Up Visit  [name] [id number] 19-Jul-1961 63 y.o. 02/10/2024   Principle Diagnosis:  Acute thrombus in left gastrocnemius vein --recurrent thromboembolic disease-past history of pulmonary embolus and left leg DVT --idiopathic  Current Therapy:   Eliquis  5 mg p.o. twice daily-lifelong     Interim History:  [name] is back for follow-up.  We see him every 6 months.  He actually had a E. coli urinary tract infection about a month ago.  Things got him to see urology about this.  He has had no problems with bleeding.  Is had a problem with cough or shortness of breath.  He has some chronic leg swelling bilaterally.  He has had no issues with nausea or vomiting.[name]  He has had no rashes.  He has had no fever.  When he had the UTI, he had a lot of urinary frequency.  Overall, I would have to say that his performance status is probably ECOG 1.   Medications:  Current Outpatient Medications:    allopurinol  (ZYLOPRIM ) 100 MG tablet, TAKE 2 TABLETS BY MOUTH DAILY, Disp: 180 tablet, Rfl: 0   carvedilol  (COREG ) 12.5 MG tablet, Take 1 tablet (12.5 mg total) by mouth daily., Disp: 90 tablet, Rfl: 1   ELIQUIS  5 MG TABS tablet, TAKE 1 TABLET BY MOUTH 2 TIMES A DAY, Disp: 180 tablet, Rfl: 0   furosemide  (LASIX ) 40 MG tablet, TAKE 1 TABLET BY MOUTH 2 TIMES A DAY, Disp: 180 tablet, Rfl: 1   levothyroxine  (SYNTHROID ) 50 MCG tablet, Take 1 tablet (50 mcg total) by mouth daily before breakfast., Disp: 90 tablet, Rfl: 1   losartan -hydrochlorothiazide  (HYZAAR) 100-12.5 MG tablet, Take 1 tablet by mouth daily., Disp: 90 tablet, Rfl: 1   potassium chloride  SA (KLOR-CON  M) 20 MEQ tablet, TAKE 1 TABLET BY MOUTH EVERY DAY, Disp: 90 tablet, Rfl: 1   tamsulosin  (FLOMAX ) 0.4 MG CAPS capsule, Take 1 capsule (0.4 mg total) by mouth daily., Disp: 30 capsule, Rfl: 3  Allergies: No Known Allergies  Past Medical History, Surgical history, Social history, and Family History were  reviewed and updated.  Review of Systems: Review of Systems  Constitutional: Negative.   HENT:  Negative.    Eyes: Negative.   Respiratory: Negative.    Cardiovascular: Negative.   Gastrointestinal: Negative.   Endocrine: Negative.   Genitourinary: Negative.    Musculoskeletal:  Positive for arthralgias.  Skin: Negative.   Neurological: Negative.   Hematological: Negative.   Psychiatric/Behavioral: Negative.      Physical Exam:  height is 6\' 1"  (1.854 m) and weight is 356 lb (161.5 kg) (abnormal). His oral temperature is 98 F (36.7 C). His blood pressure is 129/74 and his pulse is 65. His respiration is 20 and oxygen saturation is 98%.   Wt Readings from Last 3 Encounters:  02/10/24 (!) 356 lb (161.5 kg)  01/15/24 (!) 357 lb (161.9 kg)  09/02/23 (!) 359 lb 6.4 oz (163 kg)    Physical Exam Vitals reviewed.  HENT:     Head: Normocephalic and atraumatic.  Eyes:     Pupils: Pupils are equal, round, and reactive to light.  Cardiovascular:     Rate and Rhythm: Normal rate and regular rhythm.     Heart sounds: Normal heart sounds.  Pulmonary:     Effort: Pulmonary effort is normal.     Breath sounds: Normal breath sounds.  Abdominal:     General: Bowel sounds are normal.     Palpations: Abdomen  is soft.  Musculoskeletal:        General: No tenderness or deformity. Normal range of motion.     Cervical back: Normal range of motion.     Comments: His extremities does show significant edema.  He has more swelling in the left leg and the right leg.  He does have pitting edema in the left leg.  I really cannot palpate a venous cord.  Lymphadenopathy:     Cervical: No cervical adenopathy.  Skin:    General: Skin is warm and dry.     Findings: No erythema or rash.  Neurological:     Mental Status: He is alert and oriented to person, place, and time.  Psychiatric:        Behavior: Behavior normal.        Thought Content: Thought content normal.        Judgment: Judgment  normal.     Lab Results  Component Value Date   WBC 7.5 02/10/2024   HGB 14.5 02/10/2024   HCT 42.2 02/10/2024   MCV 95.9 02/10/2024   PLT 197 02/10/2024     Chemistry      Component Value Date/Time   NA 138 01/15/2024 0914   NA 143 05/31/2017 1502   NA 140 01/25/2017 0958   K 4.2 01/15/2024 0914   K 3.9 05/31/2017 1502   K 3.9 01/25/2017 0958   CL 101 01/15/2024 0914   CL 104 05/31/2017 1502   CO2 29 01/15/2024 0914   CO2 32 05/31/2017 1502   CO2 26 01/25/2017 0958   BUN 17 01/15/2024 0914   BUN 17 05/31/2017 1502   BUN 19.6 01/25/2017 0958   CREATININE 1.02 01/15/2024 0914   CREATININE 1.15 08/12/2023 1453   CREATININE 1.2 05/31/2017 1502   CREATININE 1.0 01/25/2017 0958      Component Value Date/Time   CALCIUM 8.8 01/15/2024 0914   CALCIUM 9.6 05/31/2017 1502   CALCIUM 9.0 01/25/2017 0958   ALKPHOS 80 08/12/2023 1453   ALKPHOS 91 (H) 05/31/2017 1502   ALKPHOS 85 01/25/2017 0958   AST 22 08/12/2023 1453   AST 19 01/25/2017 0958   ALT 19 08/12/2023 1453   ALT 34 05/31/2017 1502   ALT 24 01/25/2017 0958   BILITOT 0.7 08/12/2023 1453   BILITOT 0.82 01/25/2017 0958       Impression and Plan: [name] is a very nice 63 year old white male.  He developed another thrombus back in November 2023.  He now is on lifelong anticoagulation.  Of note, his D-dimer was normal at 0.34.    If he needs any kind of urologic evaluation because of the E. coli UTI, I certainly do not have a problem with this.  I will still plan to see him back in 6 months.  We can certainly see him back sooner if there is any problems or any plans of any type of surgical intervention.    Ivor Mars, MD 4/28/20253:33 PM

## 2024-02-12 ENCOUNTER — Ambulatory Visit: Admitting: Family Medicine

## 2024-02-12 VITALS — BP 136/80 | HR 62 | Temp 98.2°F | Resp 16 | Wt 364.4 lb

## 2024-02-12 DIAGNOSIS — R32 Unspecified urinary incontinence: Secondary | ICD-10-CM

## 2024-02-12 DIAGNOSIS — R972 Elevated prostate specific antigen [PSA]: Secondary | ICD-10-CM | POA: Diagnosis not present

## 2024-02-12 DIAGNOSIS — R7303 Prediabetes: Secondary | ICD-10-CM | POA: Diagnosis not present

## 2024-02-12 DIAGNOSIS — Z130 Encounter for screening for diseases of the blood and blood-forming organs and certain disorders involving the immune mechanism: Secondary | ICD-10-CM

## 2024-02-12 MED ORDER — TAMSULOSIN HCL 0.4 MG PO CAPS: 0.4000 mg | ORAL_CAPSULE | Freq: Every day | ORAL | 3 refills | Status: DC

## 2024-02-13 LAB — HEMOGLOBIN A1C: Hgb A1c MFr Bld: 6.7 % — ABNORMAL HIGH (ref 4.6–6.5)

## 2024-02-13 LAB — CBC
HCT: 40.8 % (ref 39.0–52.0)
Hemoglobin: 13.9 g/dL (ref 13.0–17.0)
MCHC: 33.9 g/dL (ref 30.0–36.0)
MCV: 96.6 fl (ref 78.0–100.0)
Platelets: 198 10*3/uL (ref 150.0–400.0)
RBC: 4.23 Mil/uL (ref 4.22–5.81)
RDW: 13 % (ref 11.5–15.5)
WBC: 6.7 10*3/uL (ref 4.0–10.5)

## 2024-02-13 LAB — PSA: PSA: 0.43 ng/mL (ref 0.10–4.00)

## 2024-02-27 ENCOUNTER — Other Ambulatory Visit: Payer: Self-pay | Admitting: Family Medicine

## 2024-02-27 DIAGNOSIS — I82492 Acute embolism and thrombosis of other specified deep vein of left lower extremity: Secondary | ICD-10-CM

## 2024-02-27 DIAGNOSIS — M10079 Idiopathic gout, unspecified ankle and foot: Secondary | ICD-10-CM

## 2024-03-02 ENCOUNTER — Ambulatory Visit: Payer: BC Managed Care – PPO | Admitting: Family Medicine

## 2024-03-30 ENCOUNTER — Other Ambulatory Visit: Payer: Self-pay | Admitting: Family Medicine

## 2024-03-30 DIAGNOSIS — R7989 Other specified abnormal findings of blood chemistry: Secondary | ICD-10-CM

## 2024-03-30 DIAGNOSIS — E039 Hypothyroidism, unspecified: Secondary | ICD-10-CM

## 2024-03-30 DIAGNOSIS — I10 Essential (primary) hypertension: Secondary | ICD-10-CM

## 2024-05-27 ENCOUNTER — Ambulatory Visit: Admitting: Family Medicine

## 2024-05-27 ENCOUNTER — Telehealth: Payer: Self-pay

## 2024-05-27 ENCOUNTER — Encounter: Payer: Self-pay | Admitting: Family Medicine

## 2024-05-27 VITALS — BP 140/90 | HR 88 | Temp 97.8°F | Resp 20 | Ht 73.0 in | Wt 355.0 lb

## 2024-05-27 DIAGNOSIS — L03116 Cellulitis of left lower limb: Secondary | ICD-10-CM

## 2024-05-27 MED ORDER — SULFAMETHOXAZOLE-TRIMETHOPRIM 800-160 MG PO TABS: 1.0000 | ORAL_TABLET | Freq: Two times a day (BID) | ORAL | 0 refills | Status: DC

## 2024-05-27 NOTE — Telephone Encounter (Signed)
 Pt scheduled

## 2024-05-27 NOTE — Progress Notes (Signed)
  Healthcare at [facility] [address], [address] [place], [place] [address] [phone]  Date:  05/27/2024   Name:  [name]   DOB:  Jan 21, 1961   MRN:  [id number]  PCP:  Watt Harlene BROCKS, MD    Chief Complaint: Leg Pain (Left , onset: 4 days)   History of Present Illness:  [name] is a 63 y.o. very pleasant male patient who presents with the following:  Patient seen today with possible leg cellulitis.  I saw him most recently in April of this year - history of hypertension, DVT, hypothyroidism, sleep apnea, obesity, prediabetes, gout  Follows with hematology, he uses Eliquis  for history of DVT  He had a UTI in spring of this year, recovered fully He does use CPAP for his sleep apnea  Pt noted the left leg only swelling a bit more about a week ago Today is Wednesday- This past Saturday he was moving and thought something stung his leg.  Did not seem to be a big deal, but the next day it continued to feel tender It is still a bit swollen but does not seem to be getting worse  He notes a red and tender, warm area on the medial leg No fever or chills, no body aches  He is still taking eliquis    He was seen several times towards the end of 2023 with cellulitis of his leg-he was given a shot of Rocephin  and also doxycycline ; however based on culture results at that time his prescription was changed from doxycycline  to Levaquin  Patient notes his leg cellulitis does not seem nearly as bad as it did back in 2023 but he wanted to catch it before it got worse  Wound culture 09/2022: SOLATE 1: Pseudomonas aeruginosa Abnormal   Comment: Heavy growth of Pseudomonas aeruginosa  Resulting Agency [facility]     Susceptibility   Pseudomonas aeruginosa    AEROBIC CULT, GRAM STAIN NEGATIVE 1    CEFEPIME 8 Sensitive    CEFTAZIDIME 4 Sensitive    CIPROFLOXACIN  1 Intermediate    GENTAMICIN 4 Sensitive    IMIPENEM 1  Sensitive    LEVOFLOXACIN  1 Sensitive    PIP/TAZO <=4 Sensitive    TOBRAMYCIN <=1 Sensitive 1      Lab Results  Component Value Date   HGBA1C 6.7 (H) 02/12/2024     Patient Active Problem List   Diagnosis Date Noted   Urinary incontinence 01/15/2024   Acute gout of right knee 07/04/2022   Type 2 diabetes mellitus associated with morbid obesity (HCC) 02/14/2020   Acute deep vein thrombosis (DVT) of left lower extremity (HCC) 02/05/2016   Acute pulmonary embolism (HCC) 02/04/2016   Lung nodule 02/04/2016   Obesity 02/04/2016   Idiopathic hypothyroidism 02/09/2015   Edema of both legs 02/09/2015   Sleep apnea 03/21/2013   Hypertension, benign 03/21/2013    Past Medical History:  Diagnosis Date   Deep vein thrombosis (DVT) of left lower extremity (HCC)    GERD (gastroesophageal reflux disease)    History of chicken pox    Hypertension    Measles    Pulmonary embolism (HCC)    Sleep apnea    Thyroid  disease    Unfounded    Past Surgical History:  Procedure Laterality Date   COLONOSCOPY WITH PROPOFOL  N/A 11/29/2016   Procedure: COLONOSCOPY WITH PROPOFOL ;  Surgeon: Lamar Bunk, MD;  Location: [facility];  Service: Endoscopy;  Laterality: N/A;  FOOT SURGERY     Chain saw accident    Social History   Tobacco Use   Smoking status: Never   Smokeless tobacco: Never  Vaping Use   Vaping status: Never Used  Substance Use Topics   Alcohol use: No    Alcohol/week: 0.0 standard drinks of alcohol   Drug use: No    Family History  Problem Relation Age of Onset   Hypertension Father        Living   Heart disease Mother        Living   Pulmonary embolism Mother    Anemia Mother    Hypertension Other        Paternal Side   Hypertension Sister        x2   Migraines Sister    Allergies Son        x1    No Known Allergies  Medication list has been reviewed and updated.  Current Outpatient Medications on File Prior to Visit  Medication Sig Dispense Refill    allopurinol  (ZYLOPRIM ) 100 MG tablet TAKE 2 TABLETS BY MOUTH DAILY 180 tablet 0   carvedilol  (COREG ) 12.5 MG tablet Take 1 tablet (12.5 mg total) by mouth daily. 90 tablet 1   ELIQUIS  5 MG TABS tablet TAKE 1 TABLET BY MOUTH 2 TIMES A DAY 180 tablet 0   furosemide  (LASIX ) 40 MG tablet TAKE 1 TABLET BY MOUTH 2 TIMES A DAY 180 tablet 1   levothyroxine  (SYNTHROID ) 50 MCG tablet Take 1 tablet (50 mcg total) by mouth daily before breakfast. 90 tablet 1   losartan -hydrochlorothiazide  (HYZAAR) 100-12.5 MG tablet Take 1 tablet by mouth daily. 90 tablet 1   potassium chloride  SA (KLOR-CON  M) 20 MEQ tablet TAKE 1 TABLET BY MOUTH EVERY DAY 90 tablet 1   tamsulosin  (FLOMAX ) 0.4 MG CAPS capsule Take 1 capsule (0.4 mg total) by mouth daily. 90 capsule 3   No current facility-administered medications on file prior to visit.    Review of Systems:  As per HPI- otherwise negative.   Physical Examination: Vitals:   05/27/24 1502 05/27/24 1520  BP: (!) 168/90 (!) 140/90  Pulse: 88   Resp: 20   Temp: 97.8 F (36.6 C)   SpO2: 98%    Vitals:   05/27/24 1502  Weight: (!) 355 lb (161 kg)  Height: 6' 1 (1.854 m)   Body mass index is 46.84 kg/m. Ideal Body Weight: Weight in (lb) to have BMI = 25: 189.1  GEN: no acute distress. Obese, otherwise looks well  HEENT: Atraumatic, Normocephalic.  Ears and Nose: No external deformity. CV: RRR, No M/G/R. No JVD. No thrill. No extra heart sounds. PULM: CTA B, no wheezes, crackles, rhonchi. No retractions. No resp. distress. No accessory muscle use. EXTR: No c/c/e PSYCH: Normally interactive. Conversant.  Area of likely cellulitis on left anterior shin as pictured below-mildly tender   Wt Readings from Last 3 Encounters:  05/27/24 (!) 355 lb (161 kg)  02/12/24 (!) 364 lb 6.4 oz (165.3 kg)  02/10/24 (!) 356 lb (161.5 kg)    Assessment and Plan: Cellulitis of left leg - Plan: sulfamethoxazole -trimethoprim  (BACTRIM  DS) 800-160 MG tablet  Patient  seen today with recurrent cellulitis of his leg, likely related to venous insufficiency and body habitus.  Will start him on Septra  double strength, I asked him to please let me know if he is not improving within about 48 hours, sooner if he is getting worse.  He appears euvolemic at this  time, weight is stable.  He will keep us  closely apprised regarding his progress  Signed Harlene Schroeder, MD

## 2024-05-27 NOTE — Patient Instructions (Signed)
 We will treat your skin infection with septra  twice daily for 10 days.  If you are not seeing improvement in 2 days please alert me!  Let me know sooner if you are getting worse

## 2024-05-27 NOTE — Telephone Encounter (Signed)
 Pt called and lvm to return call and schedule an appt today at 3 pm

## 2024-05-27 NOTE — Telephone Encounter (Signed)
 Copied from CRM [id number]. Topic: Clinical - Medical Advice >> May 27, 2024  9:22 AM Thersia BROCKS wrote: Reason for CRM: Patient has been having issues with his left leg , believes its cellulitis .  Wanted to know if Dr.[name] could prescribe the medication that he was on before for it. Would like it sent to his pharmacy

## 2024-06-02 ENCOUNTER — Other Ambulatory Visit: Payer: Self-pay | Admitting: Family Medicine

## 2024-06-02 DIAGNOSIS — M10079 Idiopathic gout, unspecified ankle and foot: Secondary | ICD-10-CM

## 2024-06-02 DIAGNOSIS — I82492 Acute embolism and thrombosis of other specified deep vein of left lower extremity: Secondary | ICD-10-CM

## 2024-07-02 ENCOUNTER — Other Ambulatory Visit: Payer: Self-pay | Admitting: Family Medicine

## 2024-07-02 DIAGNOSIS — R7989 Other specified abnormal findings of blood chemistry: Secondary | ICD-10-CM

## 2024-07-02 DIAGNOSIS — I10 Essential (primary) hypertension: Secondary | ICD-10-CM

## 2024-07-02 DIAGNOSIS — E039 Hypothyroidism, unspecified: Secondary | ICD-10-CM

## 2024-08-08 NOTE — Progress Notes (Addendum)
 [facility] [address] [place], [place] [address] [phone]  Date:  08/17/2024   Name:  [name]   DOB:  [birth year]   MRN:  [id number]  PCP:  Watt Harlene BROCKS, MD    Chief Complaint: No chief complaint on file.   History of Present Illness:  [name] is a 63 y.o. very pleasant male patient who presents with the following:  Patient seen today for periodic follow-up.  I saw him most recently in August when he had recurrent cellulitis of his left leg, which was treated with Septra -  history of hypertension, DVT, hypothyroidism, sleep apnea, obesity, prediabetes, gout, UTI Follows with hematology, he uses Eliquis  for history of DVT   Can update A1c Eye exam-  will catch up on this  Flu- declines  Recommend pneumonia vaccine, Shingrix He has declined all COVID-19 vaccination  Lab Results  Component Value Date   HGBA1C 6.7 (H) 02/12/2024   Lab Results  Component Value Date   PSA 0.43 02/12/2024   PSA 1.75 01/15/2024   PSA 0.23 09/02/2023    Discussed the use of AI scribe software for clinical note transcription with the patient, who gave verbal consent to proceed.  History of Present Illness [name] is a 63 year old male who presents with intermittent dysphagia.  He has been experiencing intermittent episodes of dysphagia for approximately two years, described as food getting 'stuck' and requiring liquid to help it pass. The episodes are sporadic, not occurring daily, and have not increased in frequency according to him, although his wife believes otherwise. The last episode was about two weeks ago, sometimes triggered by drinking tea or eating tougher foods like steak.  He is currently on Eliquis  as a blood thinner. He previously took Flomax  for a urinary infection but stopped due to back pain, which resolved after discontinuation. He wears compression socks daily for leg swelling,  which persists despite his use.  He experiences difficulty with vision, particularly with reading small print from a distance. He plans to get an eye exam but has to get a new optho- his previous retired  His father had a history of similar swallowing issues.  Declines all immunizations today    Patient Active Problem List   Diagnosis Date Noted   Urinary incontinence 01/15/2024   Acute gout of right knee 07/04/2022   Type 2 diabetes mellitus associated with morbid obesity (HCC) 02/14/2020   Acute deep vein thrombosis (DVT) of left lower extremity (HCC) 02/05/2016   Acute pulmonary embolism (HCC) 02/04/2016   Lung nodule 02/04/2016   Obesity 02/04/2016   Idiopathic hypothyroidism 02/09/2015   Edema of both legs 02/09/2015   Sleep apnea 03/21/2013   Hypertension, benign 03/21/2013    Past Medical History:  Diagnosis Date   Deep vein thrombosis (DVT) of left lower extremity (HCC)    GERD (gastroesophageal reflux disease)    History of chicken pox    Hypertension    Measles    Pulmonary embolism (HCC)    Sleep apnea    Thyroid  disease    Unfounded    Past Surgical History:  Procedure Laterality Date   COLONOSCOPY WITH PROPOFOL  N/A 11/29/2016   Procedure: COLONOSCOPY WITH PROPOFOL ;  Surgeon: Lamar Bunk, MD;  Location: [facility];  Service: Endoscopy;  Laterality: N/A;   FOOT SURGERY     Chain saw accident    Social History   Tobacco  Use   Smoking status: Never   Smokeless tobacco: Never  Vaping Use   Vaping status: Never Used  Substance Use Topics   Alcohol use: No    Alcohol/week: 0.0 standard drinks of alcohol   Drug use: No    Family History  Problem Relation Age of Onset   Hypertension Father        Living   Heart disease Mother        Living   Pulmonary embolism Mother    Anemia Mother    Hypertension Other        Paternal Side   Hypertension Sister        x2   Migraines Sister    Allergies Son        x1    No Known  Allergies  Medication list has been reviewed and updated.  Current Outpatient Medications on File Prior to Visit  Medication Sig Dispense Refill   allopurinol  (ZYLOPRIM ) 100 MG tablet Take 2 tablets (200 mg total) by mouth daily. 180 tablet 0   apixaban  (ELIQUIS ) 5 MG TABS tablet Take 1 tablet (5 mg total) by mouth 2 (two) times daily. 180 tablet 0   carvedilol  (COREG ) 12.5 MG tablet Take 1 tablet (12.5 mg total) by mouth daily. 90 tablet 1   furosemide  (LASIX ) 40 MG tablet Take 1 tablet (40 mg total) by mouth 2 (two) times daily. 180 tablet 1   levothyroxine  (SYNTHROID ) 50 MCG tablet Take 1 tablet (50 mcg total) by mouth daily before breakfast. 90 tablet 1   losartan -hydrochlorothiazide  (HYZAAR) 100-12.5 MG tablet Take 1 tablet by mouth daily. 90 tablet 1   potassium chloride  SA (KLOR-CON  M) 20 MEQ tablet Take 1 tablet (20 mEq total) by mouth daily. 90 tablet 1   No current facility-administered medications on file prior to visit.    Review of Systems:  As per HPI- otherwise negative.   Physical Examination: Vitals:   08/17/24 1543  BP: 138/70  Pulse: 69  Resp: 16  Temp: 98.6 F (37 C)  SpO2: 97%   Vitals:   08/17/24 1543  Weight: (!) 356 lb (161.5 kg)  Height: 6' 1 (1.854 m)   Body mass index is 46.97 kg/m. Ideal Body Weight: Weight in (lb) to have BMI = 25: 189.1  GEN: no acute distress. Obese, looks well  HEENT: Atraumatic, Normocephalic.  Ears and Nose: No external deformity. CV: RRR, No M/G/R. No JVD. No thrill. No extra heart sounds. PULM: CTA B, no wheezes, crackles, rhonchi. No retractions. No resp. distress. No accessory muscle use. ABD: S, NT, ND, +BS. No rebound. No HSM. EXTR: No c/c/e PSYCH: Normally interactive. Conversant.    Assessment and Plan: Essential hypertension  Prediabetes - Plan: Hemoglobin A1c  Screening, lipid - Plan: Lipid panel  Acquired hypothyroidism - Plan: TSH BP under good control on current medication He continues to take  eliquis   Assessment & Plan Esophageal dysphagia Intermittent esophageal dysphagia for two years, possibly due to esophageal constriction. Symptoms sporadic, not worsening per him, but wife disagrees. Occurs with certain foods like steak. He declines GI referral right now but will keep this in min  - Advise monitoring symptoms and notify if he worsens. Chew carefully and take small bites  - Consider gastroenterology referral if symptoms become more bothersome.  Leg swelling Chronic leg swelling managed with compression stockings. Swelling persists but is controlled with consistent use. - Continue wearing compression stockings daily, especially when active.  Visual impairment Reports difficulty with vision,  both near and far. No recent eye examination or current eye care provider. Concern about potential cataracts, no blurriness reported. - Recommend finding an eye care provider within insurance network for a comprehensive eye examination.  Prediabetes Recent glucose levels reported as good, A1c not checked recently. A1c provides a three-month average of glucose levels. - Order A1c test to assess average glucose levels over the past three months.  General Health Maintenance Declined flu, pneumonia, and shingles vaccinations. Thyroid  function not checked in the past year. - Order thyroid  function test.  Signed Harlene Schroeder, MD  Received labs, 11/4.  Message to patient  Results for orders placed or performed in visit on 08/17/24  Hemoglobin A1c   Collection Time: 08/17/24  4:01 PM  Result Value Ref Range   Hgb A1c MFr Bld 6.5 4.6 - 6.5 %  Lipid panel   Collection Time: 08/17/24  4:01 PM  Result Value Ref Range   Cholesterol 159 0 - 200 mg/dL   Triglycerides 799.9 (H) 0.0 - 149.0 mg/dL   HDL 57.79 >60.99 mg/dL   VLDL 59.9 0.0 - 59.9 mg/dL   LDL Cholesterol 77 0 - 99 mg/dL   Total CHOL/HDL Ratio 4    NonHDL 116.85   TSH   Collection Time: 08/17/24  4:01 PM  Result Value Ref  Range   TSH 1.78 0.35 - 5.50 uIU/mL    "

## 2024-08-10 ENCOUNTER — Other Ambulatory Visit: Payer: Self-pay | Admitting: Family Medicine

## 2024-08-10 DIAGNOSIS — R600 Localized edema: Secondary | ICD-10-CM

## 2024-08-12 ENCOUNTER — Inpatient Hospital Stay: Attending: Hematology & Oncology

## 2024-08-12 ENCOUNTER — Inpatient Hospital Stay: Admitting: Hematology & Oncology

## 2024-08-12 VITALS — BP 136/70 | HR 62 | Temp 98.2°F | Resp 17 | Wt 352.8 lb

## 2024-08-12 DIAGNOSIS — Z7901 Long term (current) use of anticoagulants: Secondary | ICD-10-CM | POA: Insufficient documentation

## 2024-08-12 DIAGNOSIS — Z86718 Personal history of other venous thrombosis and embolism: Secondary | ICD-10-CM | POA: Diagnosis present

## 2024-08-12 DIAGNOSIS — I2699 Other pulmonary embolism without acute cor pulmonale: Secondary | ICD-10-CM | POA: Diagnosis not present

## 2024-08-12 DIAGNOSIS — Z86711 Personal history of pulmonary embolism: Secondary | ICD-10-CM | POA: Diagnosis not present

## 2024-08-12 LAB — CBC WITH DIFFERENTIAL (CANCER CENTER ONLY)
Abs Immature Granulocytes: 0.02 K/uL (ref 0.00–0.07)
Basophils Absolute: 0 K/uL (ref 0.0–0.1)
Basophils Relative: 0 %
Eosinophils Absolute: 0.4 K/uL (ref 0.0–0.5)
Eosinophils Relative: 5 %
HCT: 41.5 % (ref 39.0–52.0)
Hemoglobin: 14.4 g/dL (ref 13.0–17.0)
Immature Granulocytes: 0 %
Lymphocytes Relative: 31 %
Lymphs Abs: 2.6 K/uL (ref 0.7–4.0)
MCH: 32.5 pg (ref 26.0–34.0)
MCHC: 34.7 g/dL (ref 30.0–36.0)
MCV: 93.7 fL (ref 80.0–100.0)
Monocytes Absolute: 0.9 K/uL (ref 0.1–1.0)
Monocytes Relative: 11 %
Neutro Abs: 4.3 K/uL (ref 1.7–7.7)
Neutrophils Relative %: 53 %
Platelet Count: 207 K/uL (ref 150–400)
RBC: 4.43 MIL/uL (ref 4.22–5.81)
RDW: 12.1 % (ref 11.5–15.5)
WBC Count: 8.2 K/uL (ref 4.0–10.5)
nRBC: 0 % (ref 0.0–0.2)

## 2024-08-12 LAB — CMP (CANCER CENTER ONLY)
ALT: 22 U/L (ref 0–44)
AST: 24 U/L (ref 15–41)
Albumin: 4.2 g/dL (ref 3.5–5.0)
Alkaline Phosphatase: 92 U/L (ref 38–126)
Anion gap: 11 (ref 5–15)
BUN: 23 mg/dL (ref 8–23)
CO2: 26 mmol/L (ref 22–32)
Calcium: 9.1 mg/dL (ref 8.9–10.3)
Chloride: 101 mmol/L (ref 98–111)
Creatinine: 1 mg/dL (ref 0.61–1.24)
GFR, Estimated: >60 mL/min (ref >60)
Glucose, Bld: 99 mg/dL (ref 70–99)
Potassium: 4.1 mmol/L (ref 3.5–5.1)
Sodium: 138 mmol/L (ref 135–145)
Total Bilirubin: 0.5 mg/dL (ref 0.0–1.2)
Total Protein: 6.9 g/dL (ref 6.5–8.1)

## 2024-08-12 NOTE — Progress Notes (Signed)
 Hematology and Oncology Follow Up Visit  TEDD COTTRILL [id number] September 05, 1961 63 y.o. 08/12/2024   Principle Diagnosis:  Acute thrombus in left gastrocnemius vein --recurrent thromboembolic disease-past history of pulmonary embolus and left leg DVT --idiopathic  Current Therapy:   Eliquis  5 mg p.o. twice daily-lifelong     Interim History:  Mr. Downard is back for follow-up.  We see him every 6 months.  So far, he has been doing quite well.  He is working quite a bit.  He is actually doing a lot of overtime work.  He has had no problems since we last saw him.  He had no issues with medication.  He had a garden which unfortunately did not do as well as he would have liked because of all the rain we had this summer.  He has had no issues with the Eliquis .  He has had no bleeding.  Has had no bruising.  Had no change in bowel or bladder habits.  He does have little swelling in the left leg.  Does use compression stockings.  He has had no fever.  He has had no exposure to COVID.  Overall, I will say that his performance status is probably ECOG 0.    Medications:  Current Outpatient Medications:    allopurinol  (ZYLOPRIM ) 100 MG tablet, Take 2 tablets (200 mg total) by mouth daily., Disp: 180 tablet, Rfl: 0   apixaban  (ELIQUIS ) 5 MG TABS tablet, Take 1 tablet (5 mg total) by mouth 2 (two) times daily., Disp: 180 tablet, Rfl: 0   carvedilol  (COREG ) 12.5 MG tablet, Take 1 tablet (12.5 mg total) by mouth daily., Disp: 90 tablet, Rfl: 1   furosemide  (LASIX ) 40 MG tablet, Take 1 tablet (40 mg total) by mouth 2 (two) times daily., Disp: 180 tablet, Rfl: 1   levothyroxine  (SYNTHROID ) 50 MCG tablet, Take 1 tablet (50 mcg total) by mouth daily before breakfast., Disp: 90 tablet, Rfl: 1   losartan -hydrochlorothiazide  (HYZAAR) 100-12.5 MG tablet, Take 1 tablet by mouth daily., Disp: 90 tablet, Rfl: 1   potassium chloride  SA (KLOR-CON  M) 20 MEQ tablet, Take 1 tablet (20 mEq total) by mouth  daily., Disp: 90 tablet, Rfl: 1   tamsulosin  (FLOMAX ) 0.4 MG CAPS capsule, Take 1 capsule (0.4 mg total) by mouth daily., Disp: 90 capsule, Rfl: 3  Allergies: No Known Allergies  Past Medical History, Surgical history, Social history, and Family History were reviewed and updated.  Review of Systems: Review of Systems  Constitutional: Negative.   HENT:  Negative.    Eyes: Negative.   Respiratory: Negative.    Cardiovascular: Negative.   Gastrointestinal: Negative.   Endocrine: Negative.   Genitourinary: Negative.    Musculoskeletal:  Positive for arthralgias.  Skin: Negative.   Neurological: Negative.   Hematological: Negative.   Psychiatric/Behavioral: Negative.      Physical Exam:  weight is 352 lb 12.8 oz (160 kg) (abnormal). His oral temperature is 98.2 F (36.8 C). His blood pressure is 136/70 and his pulse is 62. His respiration is 17 and oxygen saturation is 98%.   Wt Readings from Last 3 Encounters:  08/12/24 (!) 352 lb 12.8 oz (160 kg)  05/27/24 (!) 355 lb (161 kg)  02/12/24 (!) 364 lb 6.4 oz (165.3 kg)    Physical Exam Vitals reviewed.  HENT:     Head: Normocephalic and atraumatic.  Eyes:     Pupils: Pupils are equal, round, and reactive to light.  Cardiovascular:     Rate and Rhythm:  Normal rate and regular rhythm.     Heart sounds: Normal heart sounds.  Pulmonary:     Effort: Pulmonary effort is normal.     Breath sounds: Normal breath sounds.  Abdominal:     General: Bowel sounds are normal.     Palpations: Abdomen is soft.  Musculoskeletal:        General: No tenderness or deformity. Normal range of motion.     Cervical back: Normal range of motion.     Comments: His extremities does show significant edema.  He has more swelling in the left leg and the right leg.  He does have pitting edema in the left leg.  I really cannot palpate a venous cord.  Lymphadenopathy:     Cervical: No cervical adenopathy.  Skin:    General: Skin is warm and dry.      Findings: No erythema or rash.  Neurological:     Mental Status: He is alert and oriented to person, place, and time.  Psychiatric:        Behavior: Behavior normal.        Thought Content: Thought content normal.        Judgment: Judgment normal.      Lab Results  Component Value Date   WBC 8.2 08/12/2024   HGB 14.4 08/12/2024   HCT 41.5 08/12/2024   MCV 93.7 08/12/2024   PLT 207 08/12/2024     Chemistry      Component Value Date/Time   NA 138 08/12/2024 1512   NA 143 05/31/2017 1502   NA 140 01/25/2017 0958   K 4.1 08/12/2024 1512   K 3.9 05/31/2017 1502   K 3.9 01/25/2017 0958   CL 101 08/12/2024 1512   CL 104 05/31/2017 1502   CO2 26 08/12/2024 1512   CO2 32 05/31/2017 1502   CO2 26 01/25/2017 0958   BUN 23 08/12/2024 1512   BUN 17 05/31/2017 1502   BUN 19.6 01/25/2017 0958   CREATININE 1.00 08/12/2024 1512   CREATININE 1.2 05/31/2017 1502   CREATININE 1.0 01/25/2017 0958      Component Value Date/Time   CALCIUM 9.1 08/12/2024 1512   CALCIUM 9.6 05/31/2017 1502   CALCIUM 9.0 01/25/2017 0958   ALKPHOS 92 08/12/2024 1512   ALKPHOS 91 (H) 05/31/2017 1502   ALKPHOS 85 01/25/2017 0958   AST 24 08/12/2024 1512   AST 19 01/25/2017 0958   ALT 22 08/12/2024 1512   ALT 34 05/31/2017 1502   ALT 24 01/25/2017 0958   BILITOT 0.5 08/12/2024 1512   BILITOT 0.82 01/25/2017 0958       Impression and Plan:  Mr. Hazelip is a very nice 63 year old white male.  He developed another thrombus back in November 2023.  He now is on lifelong anticoagulation.  I do not see any problems with him having recurrence of his thromboembolic disease.  I do not see that had to make any changes with the Eliquis .  I told him make sure he stays well-hydrated.  As always, we will see him back in 6 months.    Maude JONELLE Crease, MD 10/29/20254:29 PM

## 2024-08-17 ENCOUNTER — Ambulatory Visit: Admitting: Family Medicine

## 2024-08-17 VITALS — BP 138/70 | HR 69 | Temp 98.6°F | Resp 16 | Ht 73.0 in | Wt 356.0 lb

## 2024-08-17 DIAGNOSIS — E039 Hypothyroidism, unspecified: Secondary | ICD-10-CM | POA: Diagnosis not present

## 2024-08-17 DIAGNOSIS — R7303 Prediabetes: Secondary | ICD-10-CM | POA: Diagnosis not present

## 2024-08-17 DIAGNOSIS — Z13220 Encounter for screening for lipoid disorders: Secondary | ICD-10-CM | POA: Diagnosis not present

## 2024-08-17 DIAGNOSIS — I10 Essential (primary) hypertension: Secondary | ICD-10-CM

## 2024-08-18 ENCOUNTER — Encounter: Payer: Self-pay | Admitting: Family Medicine

## 2024-08-18 LAB — LIPID PANEL
Cholesterol: 159 mg/dL (ref 0–200)
HDL: 42.2 mg/dL (ref >39.00)
LDL Cholesterol: 77 mg/dL (ref 0–99)
NonHDL: 116.85
Total CHOL/HDL Ratio: 4
Triglycerides: 200 mg/dL — ABNORMAL HIGH (ref 0.0–149.0)
VLDL: 40 mg/dL (ref 0.0–40.0)

## 2024-08-18 LAB — HEMOGLOBIN A1C: Hgb A1c MFr Bld: 6.5 % (ref 4.6–6.5)

## 2024-08-18 LAB — TSH: TSH: 1.78 u[IU]/mL (ref 0.35–5.50)

## 2024-09-09 ENCOUNTER — Other Ambulatory Visit: Payer: Self-pay | Admitting: Family Medicine

## 2024-09-09 DIAGNOSIS — M10079 Idiopathic gout, unspecified ankle and foot: Secondary | ICD-10-CM

## 2024-09-09 DIAGNOSIS — I82492 Acute embolism and thrombosis of other specified deep vein of left lower extremity: Secondary | ICD-10-CM

## 2024-10-14 ENCOUNTER — Ambulatory Visit: Payer: Self-pay

## 2024-10-14 NOTE — Telephone Encounter (Signed)
 FYI Only or Action Required?: FYI only for provider: Patient going to UC.  Patient was last seen in primary care on 08/17/2024 by [name], Harlene BROCKS, MD.  Called Nurse Triage reporting Diarrhea.  Symptoms began a week ago.  Interventions attempted: OTC medications: Immodium.  Symptoms are: unchanged.  Triage Disposition: See PCP When Office is Open (Within 3 Days)  Patient/caregiver understands and will follow disposition?: Yes   Reason for Disposition  [1] MILD diarrhea (e.g., 1-3 or more stools than normal in past 24 hours) AND [2] present >  7 days  (Exception: Chronic diarrhea that is not worse.)  Answer Assessment - Initial Assessment Questions Is having more gas lately than diarrhea. Imodium isn't working. Says food and water are just going through him. Hasn't been eating much. Is not having much diarrhea now. Says he feels good otherwise. No appointments available today, Friday appointment offered. Says he will go to UC.  1. DIARRHEA SEVERITY: How bad is the diarrhea? How many more stools have you had in the past 24 hours than normal?      Can't say, at least a few 2. ONSET: When did the diarrhea begin?      10/08/2024 3. STOOL DESCRIPTION:  How loose or watery is the diarrhea? What is the stool color? Is there any blood or mucous in the stool?     Loose/watery 4. VOMITING: Are you also vomiting? If Yes, ask: How many times in the past 24 hours?      Denies 5. ABDOMEN PAIN: Are you having any abdomen pain? If Yes, ask: What does it feel like? (e.g., crampy, dull, intermittent, constant)      Just know it's there acts up when he eats or drinks.  7. ORAL INTAKE: If vomiting, Have you been able to drink liquids? How much liquids have you had in the past 24 hours?     Yes, but just goes through  8. HYDRATION: Any signs of dehydration? (e.g., dry mouth [not just dry lips], too weak to stand, dizziness, new weight loss) When did you last urinate?      Denies 9. EXPOSURE: Have you traveled to a foreign country recently? Have you been exposed to anyone with diarrhea? Could you have eaten any food that was spoiled?     Denies 10. ANTIBIOTIC USE: Are you taking antibiotics now or have you taken antibiotics in the past 2 months?       Denies 11. OTHER SYMPTOMS: Do you have any other symptoms? (e.g., fever, blood in stool)     Denies  Protocols used: Diarrhea-A-AH

## 2024-10-14 NOTE — Telephone Encounter (Signed)
FYI. Pt going to UC.

## 2024-10-14 NOTE — Telephone Encounter (Signed)
 Patient is going to urgent care today. FYI

## 2025-02-10 ENCOUNTER — Inpatient Hospital Stay

## 2025-02-10 ENCOUNTER — Inpatient Hospital Stay: Admitting: Hematology & Oncology

## 2025-02-15 ENCOUNTER — Ambulatory Visit: Admitting: Family Medicine
